# Patient Record
Sex: Female | Born: 1955 | Race: White | Hispanic: No | Marital: Married | Smoking: Former smoker
Health system: Southern US, Community
[De-identification: ages and names within clinical notes are randomized; demographics above are authoritative.]

## PROBLEM LIST (undated history)

## (undated) DIAGNOSIS — M1990 Unspecified osteoarthritis, unspecified site: Secondary | ICD-10-CM

## (undated) DIAGNOSIS — F32A Depression, unspecified: Secondary | ICD-10-CM

## (undated) DIAGNOSIS — G8929 Other chronic pain: Secondary | ICD-10-CM

## (undated) DIAGNOSIS — F419 Anxiety disorder, unspecified: Secondary | ICD-10-CM

## (undated) DIAGNOSIS — R251 Tremor, unspecified: Secondary | ICD-10-CM

## (undated) DIAGNOSIS — F329 Major depressive disorder, single episode, unspecified: Secondary | ICD-10-CM

## (undated) DIAGNOSIS — G2581 Restless legs syndrome: Secondary | ICD-10-CM

## (undated) DIAGNOSIS — E559 Vitamin D deficiency, unspecified: Secondary | ICD-10-CM

## (undated) HISTORY — DX: Depression, unspecified: F32.A

## (undated) HISTORY — DX: Anxiety disorder, unspecified: F41.9

## (undated) HISTORY — DX: Vitamin D deficiency, unspecified: E55.9

## (undated) HISTORY — PX: OTHER SURGICAL HISTORY: SHX169

## (undated) HISTORY — DX: Tremor, unspecified: R25.1

## (undated) HISTORY — DX: Other chronic pain: G89.29

## (undated) HISTORY — DX: Unspecified osteoarthritis, unspecified site: M19.90

## (undated) HISTORY — DX: Restless legs syndrome: G25.81

---
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
## 2007-09-23 ENCOUNTER — Emergency Department: Payer: Self-pay | Admitting: Emergency Medicine

## 2012-02-25 ENCOUNTER — Emergency Department: Payer: Self-pay | Admitting: Emergency Medicine

## 2012-03-21 ENCOUNTER — Ambulatory Visit: Payer: Self-pay | Admitting: Family Medicine

## 2013-08-23 ENCOUNTER — Emergency Department: Payer: Self-pay | Admitting: Internal Medicine

## 2013-08-23 LAB — COMPREHENSIVE METABOLIC PANEL
AST: 9 U/L — AB (ref 15–37)
Albumin: 3.9 g/dL (ref 3.4–5.0)
Alkaline Phosphatase: 74 U/L
Anion Gap: 7 (ref 7–16)
BUN: 10 mg/dL (ref 7–18)
Bilirubin,Total: 0.5 mg/dL (ref 0.2–1.0)
CALCIUM: 9.6 mg/dL (ref 8.5–10.1)
CREATININE: 0.79 mg/dL (ref 0.60–1.30)
Chloride: 101 mmol/L (ref 98–107)
Co2: 31 mmol/L (ref 21–32)
EGFR (African American): >60
EGFR (Non-African Amer.): >60
Glucose: 116 mg/dL — ABNORMAL HIGH (ref 65–99)
Osmolality: 278 (ref 275–301)
POTASSIUM: 3.8 mmol/L (ref 3.5–5.1)
SGPT (ALT): 16 U/L (ref 12–78)
SODIUM: 139 mmol/L (ref 136–145)
Total Protein: 8.3 g/dL — ABNORMAL HIGH (ref 6.4–8.2)

## 2013-08-23 LAB — URINALYSIS, COMPLETE
BILIRUBIN, UR: NEGATIVE
Bacteria: NONE SEEN
GLUCOSE, UR: NEGATIVE mg/dL (ref 0–75)
Ketone: NEGATIVE
Leukocyte Esterase: NEGATIVE
Nitrite: NEGATIVE
PH: 7 (ref 4.5–8.0)
PROTEIN: NEGATIVE
RBC,UR: 4 /HPF (ref 0–5)
Specific Gravity: 1.013 (ref 1.003–1.030)
Squamous Epithelial: 1

## 2013-08-23 LAB — CBC WITH DIFFERENTIAL/PLATELET
BASOS ABS: 0.1 10*3/uL (ref 0.0–0.1)
Basophil %: 1.1 %
EOS ABS: 0.1 10*3/uL (ref 0.0–0.7)
Eosinophil %: 0.6 %
HCT: 42.7 % (ref 35.0–47.0)
HGB: 13.8 g/dL (ref 12.0–16.0)
LYMPHS ABS: 3.6 10*3/uL (ref 1.0–3.6)
Lymphocyte %: 25.4 %
MCH: 29.8 pg (ref 26.0–34.0)
MCHC: 32.3 g/dL (ref 32.0–36.0)
MCV: 92 fL (ref 80–100)
MONO ABS: 0.8 x10 3/mm (ref 0.2–0.9)
Monocyte %: 5.7 %
NEUTROS ABS: 9.4 10*3/uL — AB (ref 1.4–6.5)
Neutrophil %: 67.2 %
PLATELETS: 296 10*3/uL (ref 150–440)
RBC: 4.64 10*6/uL (ref 3.80–5.20)
RDW: 13.7 % (ref 11.5–14.5)
WBC: 14 10*3/uL — ABNORMAL HIGH (ref 3.6–11.0)

## 2013-09-15 ENCOUNTER — Emergency Department: Payer: Self-pay | Admitting: Emergency Medicine

## 2013-09-15 LAB — CBC WITH DIFFERENTIAL/PLATELET
Basophil #: 0.1 10*3/uL (ref 0.0–0.1)
Basophil %: 0.6 %
Eosinophil #: 0.1 10*3/uL (ref 0.0–0.7)
Eosinophil %: 0.3 %
HCT: 40.8 % (ref 35.0–47.0)
HGB: 13.6 g/dL (ref 12.0–16.0)
Lymphocyte #: 2 10*3/uL (ref 1.0–3.6)
Lymphocyte %: 10.3 %
MCH: 30.5 pg (ref 26.0–34.0)
MCHC: 33.4 g/dL (ref 32.0–36.0)
MCV: 91 fL (ref 80–100)
Monocyte #: 1.3 x10 3/mm — ABNORMAL HIGH (ref 0.2–0.9)
Monocyte %: 6.8 %
NEUTROS ABS: 15.8 10*3/uL — AB (ref 1.4–6.5)
NEUTROS PCT: 82 %
Platelet: 324 10*3/uL (ref 150–440)
RBC: 4.47 10*6/uL (ref 3.80–5.20)
RDW: 13.3 % (ref 11.5–14.5)
WBC: 19.3 10*3/uL — AB (ref 3.6–11.0)

## 2013-09-15 LAB — URINALYSIS, COMPLETE
BILIRUBIN, UR: NEGATIVE
GLUCOSE, UR: NEGATIVE mg/dL (ref 0–75)
Leukocyte Esterase: NEGATIVE
Nitrite: NEGATIVE
PH: 5 (ref 4.5–8.0)
Protein: 30
RBC,UR: 19 /HPF (ref 0–5)
SPECIFIC GRAVITY: 1.024 (ref 1.003–1.030)
Squamous Epithelial: NONE SEEN

## 2013-09-15 LAB — COMPREHENSIVE METABOLIC PANEL
ALBUMIN: 3.8 g/dL (ref 3.4–5.0)
AST: 21 U/L (ref 15–37)
Alkaline Phosphatase: 58 U/L
Anion Gap: 5 — ABNORMAL LOW (ref 7–16)
BUN: 8 mg/dL (ref 7–18)
Bilirubin,Total: 0.5 mg/dL (ref 0.2–1.0)
CO2: 31 mmol/L (ref 21–32)
Calcium, Total: 9.1 mg/dL (ref 8.5–10.1)
Chloride: 103 mmol/L (ref 98–107)
Creatinine: 0.89 mg/dL (ref 0.60–1.30)
EGFR (African American): >60
EGFR (Non-African Amer.): >60
Glucose: 103 mg/dL — ABNORMAL HIGH (ref 65–99)
OSMOLALITY: 276 (ref 275–301)
Potassium: 3.3 mmol/L — ABNORMAL LOW (ref 3.5–5.1)
SGPT (ALT): 15 U/L (ref 12–78)
Sodium: 139 mmol/L (ref 136–145)
TOTAL PROTEIN: 7.5 g/dL (ref 6.4–8.2)

## 2013-09-15 LAB — CLOSTRIDIUM DIFFICILE(ARMC)

## 2016-01-20 ENCOUNTER — Ambulatory Visit
Admission: RE | Admit: 2016-01-20 | Discharge: 2016-01-20 | Disposition: A | Discharge status: Home / Self Care | Payer: Self-pay | Source: Ambulatory Visit | Attending: Oncology | Admitting: Oncology

## 2016-01-20 ENCOUNTER — Encounter (INDEPENDENT_AMBULATORY_CARE_PROVIDER_SITE_OTHER): Payer: Self-pay

## 2016-01-20 ENCOUNTER — Ambulatory Visit: Payer: Self-pay | Attending: Oncology | Admitting: *Deleted

## 2016-01-20 VITALS — BP 126/72 | HR 62 | Temp 98.0°F | Ht 66.14 in | Wt 145.9 lb

## 2016-01-20 DIAGNOSIS — N644 Mastodynia: Secondary | ICD-10-CM

## 2016-01-20 NOTE — Progress Notes (Signed)
Subjective:     Patient ID: Michelle Walsh, female   DOB: November 15, 1955, 60 y.o.   MRN: [id number]  HPI   Review of Systems     Objective:   Physical Exam  Pulmonary/Chest: Right breast exhibits no inverted nipple, no mass, no nipple discharge, no skin change and no tenderness. Left breast exhibits no inverted nipple, no mass, no nipple discharge, no skin change and no tenderness. Breasts are asymmetrical.    Patient complains of an intermittent "electric shock like" burning pain in the left nipple for about 2 months   Left breast is larger than the right breast       Assessment:     60 year old White female presents to [facility] with complaints of a 2 month history of intermittent burning breast pain.  States it only last a couple of seconds, feels like "an electric shock", and burns around her left nipple.  Visually the patients head is bobbing with tremor like motion.  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  States it gets worse when she is under stress.  States she also has arthritis and back issues.  States she notices the breast pain mainly when she is holding her 47 month old grand child.  States she is under tremendous stress.  Her daughter and family of 21, is currently living with her, and she is helping with the children who are all under the age of 23. Her husband also has MS.  The patient also states she drinks a lot of coffee.  States I always have a cup in my hand.  On clinical breast exam there is no dominant mass, skin changes, nipple discharge, or lymphadenopathy.  There is a slight asymmetry in the bilateral thickening in the upper outer quadrants, with greater thickening in the left breast.  Taught self breast awareness.  Patient has been screened for eligibility.  She does not have any insurance, Medicare or Medicaid.  She also meets financial eligibility.  Hand-out given on the Affordable Care Act.    Plan:     Will get bilateral  diagnostic mammogram with ultrasound.  If no findings on imaging we will re-assess the patient in about 2 months, unless she has experience increased pain.  She is to cut back on caffeine products also.  I have discussed [facility] with the patient and she is interested in some sessions.  Left message with Ebony Hail in [place] to schedule the patient for up to 6 free sessions.  We will follow-up per [facility] protocol.

## 2016-01-20 NOTE — Patient Instructions (Signed)
Gave patient hand-out, Women Staying Healthy, Active and Well from [facility], with education on breast health, pap smears, heart and colon health. 

## 2016-02-27 ENCOUNTER — Encounter: Payer: Self-pay | Admitting: *Deleted

## 2016-02-27 NOTE — Progress Notes (Signed)
Letter mailed to inform patient of her follow up appointment on 03/23/16 @ 10:30 to reassess her breast pain.  Letter mailed from the [facility] to inform patient of her normal mammogram results.  Patient is to follow-up with annual screening in one year.  HSIS to [place].

## 2016-03-23 ENCOUNTER — Ambulatory Visit: Payer: Self-pay

## 2016-03-30 ENCOUNTER — Ambulatory Visit: Payer: Self-pay | Attending: Oncology

## 2018-08-17 ENCOUNTER — Ambulatory Visit: Payer: Self-pay

## 2018-11-17 DIAGNOSIS — M25561 Pain in right knee: Secondary | ICD-10-CM | POA: Diagnosis not present

## 2018-11-17 DIAGNOSIS — M67432 Ganglion, left wrist: Secondary | ICD-10-CM | POA: Diagnosis not present

## 2018-11-17 DIAGNOSIS — Z0000 Encounter for general adult medical examination without abnormal findings: Secondary | ICD-10-CM | POA: Diagnosis not present

## 2018-11-17 DIAGNOSIS — M25521 Pain in right elbow: Secondary | ICD-10-CM | POA: Diagnosis not present

## 2018-11-17 DIAGNOSIS — M654 Radial styloid tenosynovitis [de Quervain]: Secondary | ICD-10-CM | POA: Diagnosis not present

## 2018-11-17 DIAGNOSIS — M1990 Unspecified osteoarthritis, unspecified site: Secondary | ICD-10-CM | POA: Diagnosis not present

## 2018-12-08 ENCOUNTER — Other Ambulatory Visit: Payer: Self-pay

## 2018-12-19 ENCOUNTER — Encounter: Payer: Self-pay | Admitting: *Deleted

## 2018-12-21 ENCOUNTER — Ambulatory Visit (INDEPENDENT_AMBULATORY_CARE_PROVIDER_SITE_OTHER): Payer: PPO | Admitting: Family Medicine

## 2018-12-21 ENCOUNTER — Other Ambulatory Visit: Payer: Self-pay

## 2018-12-21 ENCOUNTER — Encounter: Payer: Self-pay | Admitting: Family Medicine

## 2018-12-21 VITALS — BP 110/70 | HR 54 | Temp 98.3°F | Ht 64.0 in | Wt 137.6 lb

## 2018-12-21 DIAGNOSIS — G3281 Cerebellar ataxia in diseases classified elsewhere: Secondary | ICD-10-CM

## 2018-12-21 DIAGNOSIS — G8929 Other chronic pain: Secondary | ICD-10-CM | POA: Diagnosis not present

## 2018-12-21 DIAGNOSIS — F418 Other specified anxiety disorders: Secondary | ICD-10-CM | POA: Diagnosis not present

## 2018-12-21 DIAGNOSIS — M159 Polyosteoarthritis, unspecified: Secondary | ICD-10-CM | POA: Insufficient documentation

## 2018-12-21 DIAGNOSIS — E559 Vitamin D deficiency, unspecified: Secondary | ICD-10-CM

## 2018-12-21 DIAGNOSIS — M25561 Pain in right knee: Secondary | ICD-10-CM | POA: Diagnosis not present

## 2018-12-21 DIAGNOSIS — G243 Spasmodic torticollis: Secondary | ICD-10-CM | POA: Insufficient documentation

## 2018-12-21 DIAGNOSIS — R944 Abnormal results of kidney function studies: Secondary | ICD-10-CM

## 2018-12-21 DIAGNOSIS — Z13220 Encounter for screening for lipoid disorders: Secondary | ICD-10-CM | POA: Diagnosis not present

## 2018-12-21 DIAGNOSIS — R251 Tremor, unspecified: Secondary | ICD-10-CM

## 2018-12-21 DIAGNOSIS — E785 Hyperlipidemia, unspecified: Secondary | ICD-10-CM | POA: Diagnosis not present

## 2018-12-21 LAB — COMPREHENSIVE METABOLIC PANEL
ALT: 10 U/L (ref 0–35)
AST: 11 U/L (ref 0–37)
Albumin: 4.3 g/dL (ref 3.5–5.2)
Alkaline Phosphatase: 54 U/L (ref 39–117)
BUN: 16 mg/dL (ref 6–23)
CO2: 30 mEq/L (ref 19–32)
Calcium: 9.2 mg/dL (ref 8.4–10.5)
Chloride: 101 mEq/L (ref 96–112)
Creatinine, Ser: 1.11 mg/dL (ref 0.40–1.20)
GFR: 49.3 mL/min — ABNORMAL LOW (ref >60.00)
Glucose, Bld: 100 mg/dL — ABNORMAL HIGH (ref 70–99)
Potassium: 4.1 mEq/L (ref 3.5–5.1)
Sodium: 140 mEq/L (ref 135–145)
Total Bilirubin: 0.5 mg/dL (ref 0.2–1.2)
Total Protein: 6.6 g/dL (ref 6.0–8.3)

## 2018-12-21 LAB — CBC WITH DIFFERENTIAL/PLATELET
Basophils Absolute: 0.1 10*3/uL (ref 0.0–0.1)
Basophils Relative: 0.6 % (ref 0.0–3.0)
Eosinophils Absolute: 0.2 10*3/uL (ref 0.0–0.7)
Eosinophils Relative: 1.8 % (ref 0.0–5.0)
HCT: 37 % (ref 36.0–46.0)
Hemoglobin: 12.1 g/dL (ref 12.0–15.0)
Lymphocytes Relative: 22.7 % (ref 12.0–46.0)
Lymphs Abs: 2.3 10*3/uL (ref 0.7–4.0)
MCHC: 32.8 g/dL (ref 30.0–36.0)
MCV: 92 fl (ref 78.0–100.0)
Monocytes Absolute: 0.6 10*3/uL (ref 0.1–1.0)
Monocytes Relative: 6.2 % (ref 3.0–12.0)
Neutro Abs: 6.9 10*3/uL (ref 1.4–7.7)
Neutrophils Relative %: 68.7 % (ref 43.0–77.0)
Platelets: 302 10*3/uL (ref 150.0–400.0)
RBC: 4.03 Mil/uL (ref 3.87–5.11)
RDW: 13.6 % (ref 11.5–15.5)
WBC: 10.1 10*3/uL (ref 4.0–10.5)

## 2018-12-21 LAB — LIPID PANEL
Cholesterol: 236 mg/dL — ABNORMAL HIGH (ref 0–200)
HDL: 38 mg/dL — ABNORMAL LOW (ref >39.00)
LDL Cholesterol: 170 mg/dL — ABNORMAL HIGH (ref 0–99)
NonHDL: 198.31
Total CHOL/HDL Ratio: 6
Triglycerides: 142 mg/dL (ref 0.0–149.0)
VLDL: 28.4 mg/dL (ref 0.0–40.0)

## 2018-12-21 LAB — VITAMIN B12: Vitamin B-12: 228 pg/mL (ref 211–911)

## 2018-12-21 LAB — TSH: TSH: 4.14 u[IU]/mL (ref 0.35–4.50)

## 2018-12-21 LAB — VITAMIN D 25 HYDROXY (VIT D DEFICIENCY, FRACTURES): VITD: 80.78 ng/mL (ref 30.00–100.00)

## 2018-12-21 MED ORDER — MELOXICAM 7.5 MG PO TABS: 7.5000 mg | ORAL_TABLET | Freq: Every day | ORAL | 2 refills | Status: DC | PRN

## 2018-12-21 MED ORDER — CITALOPRAM HYDROBROMIDE 20 MG PO TABS: 20.0000 mg | ORAL_TABLET | Freq: Every day | ORAL | 1 refills | Status: DC

## 2018-12-21 NOTE — Progress Notes (Signed)
Subjective:    Patient ID: Michelle Walsh, female    DOB: January 13, 1956, 63 y.o.   MRN: [id number]  HPI  Patient presents to clinic to establish with PCP.  Patient has a past medical history of a tremor, restless leg syndrome, vitamin D deficiency, osteoarthritis of multiple joints mainly right knee at this time, depression with anxiety.  Social, surgical and family history also very reviewed and updated accordingly in chart.  She declines flu vaccine today, believes she has had 1 of the pneumonia vaccines we will wait on records from her previous PCP to determine which of the 2 pneumonia vaccine she requires.  Currently takes citalopram 20 mg once daily for her depression and anxiety.  Patient believes a lot of her depression anxiety is situational.  Her daughter and 3 young grandchildren are now living with her and her husband.  States she loves her daughter and grandchildren, but has been adjustment getting used to her quiet house now being full of young children again.  Likes role of grandparent, but now finds herself having to step into a parent role often.  Patient also does use marijuana as a form of depression and anxiety treatment.  Finds it very helpful to calm her down.  Patient has a history of tremor in bilateral upper extremities and also head shaking.  Currently is on propranolol 60 mg twice daily, this is helped a lot to reduce head shaking, but still has tremor in both arms and especially notices this when trying to hold something in either her right or left hand.  Patient has never had MRI of brain or see neurology for work-up of her tremor. Also states she does shuffle at time with walking.  Has been prescribed diclofenac topical gel to use as needed for joint pain, does not find this effective.  Most recently her right knee has been aching especially.  Feels very stiff in the morning, stiffness seems to improve as day goes on as she gets moving.  Requip works well at 1 mg dose  before bed to control restless leg.  Has history of vitamin D deficiency, currently maintains vitamin D levels by taking 5000 units vitamin D once per day.  Patient Active Problem List   Diagnosis Date Noted  . Tremor 12/21/2018  . Vitamin D deficiency 12/21/2018  . Cerebellar ataxia in diseases classified elsewhere ([place]) 12/21/2018  . Osteoarthritis of multiple joints 12/21/2018  . Chronic pain of right knee 12/21/2018  . Depression with anxiety 12/21/2018     Past Surgical History:  Procedure Laterality Date  . CESAREAN SECTION      Family History  Problem Relation Age of Onset  . Breast cancer Maternal Grandmother 28   Social History   Tobacco Use  . Smoking status: Not on file  Substance Use Topics  . Alcohol use: Not on file    Review of Systems  Constitutional: Negative for chills, fatigue and fever.  HENT: Negative for congestion, ear pain, sinus pain and sore throat.   Eyes: Negative.   Respiratory: Negative for cough, shortness of breath and wheezing.   Cardiovascular: Negative for chest pain, palpitations and leg swelling.  Gastrointestinal: Negative for abdominal pain, diarrhea, nausea and vomiting.  Genitourinary: Negative for dysuria, frequency and urgency.  Musculoskeletal: +right knee pain Skin: Negative for color change, pallor and rash.  Neurological: Negative for syncope, light-headedness and headaches. +tremor Psychiatric/Behavioral: The patient is not nervous/anxious.       Objective:   Physical Exam  Vitals signs and nursing note reviewed.  Constitutional:      General: She is not in acute distress.    Appearance: She is normal weight. She is not ill-appearing, toxic-appearing or diaphoretic.  HENT:     Head: Normocephalic and atraumatic.     Right Ear: Tympanic membrane, ear canal and external ear normal.     Left Ear: Tympanic membrane, ear canal and external ear normal.  Eyes:     General: No scleral icterus.    Extraocular Movements:  Extraocular movements intact.     Conjunctiva/sclera: Conjunctivae normal.     Pupils: Pupils are equal, round, and reactive to light.  Neck:     Musculoskeletal: Normal range of motion and neck supple. No neck rigidity.     Vascular: No carotid bruit.  Cardiovascular:     Rate and Rhythm: Normal rate and regular rhythm.     Heart sounds: Normal heart sounds.  Pulmonary:     Effort: Pulmonary effort is normal.     Breath sounds: Normal breath sounds.  Abdominal:     General: Bowel sounds are normal. There is no distension.     Palpations: Abdomen is soft. There is no mass.     Tenderness: There is no abdominal tenderness. There is no guarding or rebound.     Hernia: No hernia is present.  Musculoskeletal:     Right lower leg: No edema.     Left lower leg: No edema.  Skin:    General: Skin is warm and dry.     Capillary Refill: Capillary refill takes less than 2 seconds.     Coloration: Skin is not jaundiced or pale.  Neurological:     Mental Status: She is alert and oriented to person, place, and time.     Comments: Grips and dorsiplantar flexion equal and strong. Speech clear, smile symmetrical When holds arms straight out in front of her, can see visible tremor bilaterally.   Psychiatric:        Mood and Affect: Mood normal.        Behavior: Behavior normal.        Thought Content: Thought content normal.        Judgment: Judgment normal.    Today's Vitals   12/21/18 0905  BP: 110/70  Pulse: (!) 54  Temp: 98.3 F (36.8 C)  TempSrc: Oral  SpO2: 98%  Weight: 137 lb 9.6 oz (62.4 kg)  Height: 5\' 4"  (1.626 m)   Body mass index is 23.62 kg/m.     Assessment & Plan:      A total of 45  minutes were spent face-to-face with the patient during this encounter and over half of that time was spent on counseling and coordination of care. The patient was counseled on work up with labs, MRI, referral to neurology, xray of knee.     Tremor-patient will continue propranolol  60 mg twice daily as this does seem to help reduce her tremor especially the head shaking.  We will get MRI of brain to further investigate tremor and also I will put in neurology referral for full evaluation.  Patient does report struggling gait at times, so I want to be sure we investigate possibility of any Parkinson's disease.  Osteoarthritis multiple joint/right knee pain-patient will use Mobic 7.5 mg as needed for days when she has have more aching in the joints.  Also advised she can continue to use topical diclofenac gel if needed on sore joints.  We  will get x-ray of right knee to investigate the right knee pain that is more obvious/annoying to patient at this time.  Recommended exercising daily including walking, stretching and range of motion to keep all joints from becoming too stiff and keeping body active.  Vitamin D deficiency-we will check vitamin D levels and lab work as well as vitamin B12.  Depression with anxiety-she will continue citalopram 20 mg once per day.  Advised if smoking marijuana to be sure not to drive or operate any heavy machinery after smoking as this is medication can cause impairment and if she were to be pulled over while driving, it would constitute a DUI.  Patient verbalizes understanding of this risk with smoking marijuana.  We will get lab work in clinic today including CBC, CMP, vitamin D, B12, TSH and lipid panel.  Patient aware that so we will contact her regards to neurology referral and MRI  We will plan to have her follow-up in office in about 3 months and we can do complete physical exam at that time to get all of her screenings up-to-date for her age.

## 2018-12-26 MED ORDER — ROSUVASTATIN CALCIUM 10 MG PO TABS: 10.0000 mg | ORAL_TABLET | Freq: Every day | ORAL | 3 refills | Status: DC

## 2018-12-26 NOTE — Addendum Note (Signed)
Addended by: Philis Nettle on: 12/26/2018 04:44 PM   Modules accepted: Orders

## 2018-12-26 NOTE — Addendum Note (Signed)
Addended by: Philis Nettle on: 12/26/2018 01:01 PM   Modules accepted: Orders

## 2019-01-06 ENCOUNTER — Telehealth: Payer: Self-pay | Admitting: *Deleted

## 2019-01-06 DIAGNOSIS — R944 Abnormal results of kidney function studies: Secondary | ICD-10-CM

## 2019-01-06 NOTE — Telephone Encounter (Signed)
Please place future orders for lab appt.  

## 2019-01-09 ENCOUNTER — Other Ambulatory Visit: Payer: Self-pay

## 2019-01-09 ENCOUNTER — Other Ambulatory Visit (INDEPENDENT_AMBULATORY_CARE_PROVIDER_SITE_OTHER): Payer: PPO

## 2019-01-09 DIAGNOSIS — R944 Abnormal results of kidney function studies: Secondary | ICD-10-CM

## 2019-01-09 LAB — BASIC METABOLIC PANEL
BUN: 19 mg/dL (ref 6–23)
CO2: 30 mEq/L (ref 19–32)
Calcium: 9.5 mg/dL (ref 8.4–10.5)
Chloride: 102 mEq/L (ref 96–112)
Creatinine, Ser: 1.06 mg/dL (ref 0.40–1.20)
GFR: 51.98 mL/min — ABNORMAL LOW (ref >60.00)
Glucose, Bld: 105 mg/dL — ABNORMAL HIGH (ref 70–99)
Potassium: 3.6 mEq/L (ref 3.5–5.1)
Sodium: 139 mEq/L (ref 135–145)

## 2019-01-09 NOTE — Telephone Encounter (Signed)
BMET in

## 2019-01-10 ENCOUNTER — Encounter: Payer: Self-pay | Admitting: Neurology

## 2019-01-21 ENCOUNTER — Ambulatory Visit
Admission: RE | Admit: 2019-01-21 | Discharge: 2019-01-21 | Disposition: A | Discharge status: Home / Self Care | Payer: PPO | Source: Ambulatory Visit | Attending: Family Medicine | Admitting: Family Medicine

## 2019-01-21 ENCOUNTER — Other Ambulatory Visit: Payer: Self-pay

## 2019-01-21 DIAGNOSIS — R251 Tremor, unspecified: Secondary | ICD-10-CM | POA: Diagnosis not present

## 2019-01-21 DIAGNOSIS — G3281 Cerebellar ataxia in diseases classified elsewhere: Secondary | ICD-10-CM

## 2019-01-21 DIAGNOSIS — R270 Ataxia, unspecified: Secondary | ICD-10-CM | POA: Diagnosis not present

## 2019-02-13 NOTE — Progress Notes (Signed)
Michelle Walsh was seen today in the movement disorders clinic for neurologic consultation at the request of Guse, Jacquelynn Cree, FNP.  The consultation is for the evaluation of tremor.  Tremor: Yes.     How long has it been going on? "all my life" - had it even into elementary school  At rest or with activation?  activation  Fam hx of tremor?  Yes.  , mother and son  Located where?  Hands and head - started in hands, L more than R (she is R handed).  Cannot state when head started  Affected by caffeine:  No. (drinks 4 cans pepsi per day)  Affected by alcohol: doesn't drink enough to know if changes tremor  Affected by stress:  Yes.  , and states that her daughter and 4 children moved in with her 4 years ago and that increased stress  Affected by fatigue:  No.  Spills soup if on spoon:  No.  Spills glass of liquid if full:  No.  Affects ADL's (tying shoes, brushing teeth, etc):  No.  Tremor inducing meds:  No.   Tremor improving meds:  Propranolol 60 mg bid (started on propranolol 2-3 years ago and it really helped when started but seemed to lose efficacy with time)    I personally reviewed her MRI of the brain from January 21, 2019.  There were a few scattered punctate T2 hyperintensities.  It was otherwise unremarkable.  PREVIOUS MEDICATIONS: propranolol 60 mg bid; requip 1 mg q hs (on this for RLS) - I get "restless legs" in my whole body - arms/chest/legs  ALLERGIES:   Allergies  Allergen Reactions  . Codeine Nausea And Vomiting    CURRENT MEDICATIONS:  Current Outpatient Medications  Medication Instructions  . citalopram (CELEXA) 20 mg, Oral, Daily  . diclofenac sodium (VOLTAREN) 1 % GEL No dose, route, or frequency recorded.  . meloxicam (MOBIC) 7.5 mg, Oral, Daily PRN  . propranolol (INDERAL) 60 mg, 2 times daily  . rOPINIRole (REQUIP) 1 mg, Oral, Daily at bedtime  . rosuvastatin (CRESTOR) 10 mg, Oral, Daily  . Vitamin D3 125 mcg, Oral, Daily    PAST MEDICAL HISTORY:    Past Medical History:  Diagnosis Date  . Anxiety   . Chronic pain of right knee   . Depression   . Osteoarthritis   . Restless leg syndrome   . Tremors of nervous system   . Vitamin D deficiency     PAST SURGICAL HISTORY:   Past Surgical History:  Procedure Laterality Date  . CESAREAN SECTION    . teeth all removed      SOCIAL HISTORY:   Social History   Socioeconomic History  . Marital status: Married    Spouse name: Not on file  . Number of children: 2  . Years of education: 37  . Highest education level: Not on file  Occupational History  . Occupation: retired  Scientific laboratory technician  . Financial resource strain: Not on file  . Food insecurity    Worry: Not on file    Inability: Not on file  . Transportation needs    Medical: Not on file    Non-medical: Not on file  Tobacco Use  . Smoking status: Never Smoker  . Smokeless tobacco: Never Used  Substance and Sexual Activity  . Alcohol use: Never    Frequency: Never    Comment: very rare - 1 time per year  . Drug use: Yes    Types:  Marijuana    Comment: does it thru a vape - uses THC - "helps with anxiety"  . Sexual activity: Not on file  Lifestyle  . Physical activity    Days per week: Not on file    Minutes per session: Not on file  . Stress: Not on file  Relationships  . Social Herbalist on phone: Not on file    Gets together: Not on file    Attends religious service: Not on file    Active member of club or organization: Not on file    Attends meetings of clubs or organizations: Not on file    Relationship status: Not on file  . Intimate partner violence    Fear of current or ex partner: Not on file    Emotionally abused: Not on file    Physically abused: Not on file    Forced sexual activity: Not on file  Other Topics Concern  . Not on file  Social History Narrative   2 children   Right handed   One story home    FAMILY HISTORY:   Family Status  Relation Name Status  . MGM  (Not  Specified)  . Mother  Alive  . Father  Deceased  . Sister  Alive  . Brother  Alive  . Sister  Alive  . Daughter adopted Alive  . Son  Alive    ROS:  Review of Systems  Constitutional: Negative.   HENT: Negative.   Eyes: Negative.   Respiratory: Negative.   Cardiovascular: Negative.   Gastrointestinal: Negative.   Genitourinary: Negative.   Musculoskeletal: Negative.   Skin: Negative.   Neurological: Positive for tremors.  Endo/Heme/Allergies: Negative.   Psychiatric/Behavioral: Positive for depression. The patient is nervous/anxious.     PHYSICAL EXAMINATION:    VITALS:   Vitals:   02/17/19 0946  BP: 122/68  Pulse: 74  SpO2: 98%  Weight: 136 lb (61.7 kg)  Height: 5\' 5"  (1.651 m)    GEN:  The patient appears stated age and is in NAD. HEENT:  Normocephalic, atraumatic.  The mucous membranes are moist. The superficial temporal arteries are without ropiness or tenderness. CV:  RRR Lungs:  CTAB Neck/HEME:  There are no carotid bruits bilaterally. MS:  There is hypertrophy of R SCM.  Head is turned L with irregular tremor.  Mild L ear to L shoulder  Neurological examination:  Orientation: The patient is alert and oriented x3. Fund of knowledge is appropriate.  Recent and remote memory are intact.  Attention and concentration are normal.    Able to name objects and repeat phrases. Cranial nerves: There is good facial symmetry.  Extraocular muscles are intact. The visual fields are full to confrontational testing. The speech is fluent and clear. Soft palate rises symmetrically and there is no tongue deviation. Hearing is intact to conversational tone. Sensation: Sensation is intact to light and pinprick throughout (facial, trunk, extremities). Vibration is intact at the bilateral big toe. There is no extinction with double simultaneous stimulation. There is no sensory dermatomal level identified. Motor: Strength is 5/5 in the bilateral upper and lower extremities.   Shoulder  shrug is equal and symmetric.  There is no pronator drift. Deep tendon reflexes: Deep tendon reflexes are 2+/4 at the bilateral biceps, triceps, brachioradialis, patella and achilles. Plantar responses are downgoing bilaterally.  Movement examination: Tone: There is tone in the bilateral upper extremities.  The tone in the lower extremities is normal.  Abnormal movements:  there Is no rest tremor.  There is significant postural tremor on the L, none on the R until given a weight and then it is mild to mod.  She has significant trouble with Archimedes spirals, particularly on the left.  She has trouble pouring water from 1 glass to another in both hands. Coordination:  There is no decremation with RAM's, with any form of RAMS, including alternating supination and pronation of the forearm, hand opening and closing, finger taps, heel taps and toe taps. Gait and Station: The patient has no difficulty arising out of a deep-seated chair without the use of the hands. The patient's stride length is normal.    ASSESSMENT/PLAN:  1.  Cervical Dystonia  -I talked to the patient about the nature and pathophysiology.  The patient is having trouble with ADL's and with rotation of the head in daily life for driving.  The primary muscles involved are the right sternocleidomastoid and left splenius capitis.  The left levator scapula is also involved..  We talked about treatments.  We talked about the value of botox.  The patient was educated on the botulinum toxin the black blox warning and given a copy of the botox patient medication guide.  The patient understands that this warning states that there have been reported cases of the Botox extending beyond the injection site and creating adverse effects, similar to those of botulism. This included loss of strength, trouble walking, hoarseness, trouble saying words clearly, loss of bladder control, trouble breathing, trouble swallowing, diplopia, blurry vision and ptosis.  Most of the distant spread of Botox was happening in patients, primarily children, who received medication for spasticity or for cervical dystonia. The patient expressed understanding and desire to proceed.  I will try to get authorization.  2.    Essential Tremor.  -This is evidenced by the symmetrical nature and longstanding hx of gradually getting worse.  We discussed nature and pathophysiology.  We discussed that this can continue to gradually get worse with time.  We discussed that some medications can worsen this, as can caffeine use.  We discussed medication therapy as well as surgical therapy.  Ultimately, the patient decided to start primidone 50 mg daily for a week and then 50 mg bid.  This likely won't be enough  -continue propranolol 60 mg bid.    -I talked to the patient about the logistics associated with DBS therapy.  I talked to the patient about risks/benefits/side effects of DBS therapy.  We talked about risks which included but were not limited to infection, paralysis, intraoperative seizure, death, stroke, bleeding around the electrode.   I talked to patient about fiducial placement 1 week prior to DBS therapy.  I talked to the patient about what to expect in the operating room, including the fact that this is an awake surgery.  We talked about battery placement as well as which is done under general anesthesia, generally approximately one week following the initial surgery.  We also talked about the fact that the patient will need to be off of medications for surgery.  The patient and family were given the opportunity to ask questions, which they did, and I answered them to the best of my ability today.  She was shown HIPAA compliant videos of patients who have previously had surgery.  She is going to think about this as an option.  3.  Follow up is anticipated when we get approval for her botox.  Much greater than 50% of this visit  was spent in counseling and coordinating care.  Total  face to face time:  60 min         Cc:  Guse, Jacquelynn Cree, FNP

## 2019-02-17 ENCOUNTER — Other Ambulatory Visit: Payer: Self-pay

## 2019-02-17 ENCOUNTER — Encounter: Payer: Self-pay | Admitting: Neurology

## 2019-02-17 ENCOUNTER — Ambulatory Visit: Payer: PPO | Admitting: Neurology

## 2019-02-17 VITALS — BP 122/68 | HR 74 | Ht 65.0 in | Wt 136.0 lb

## 2019-02-17 DIAGNOSIS — G250 Essential tremor: Secondary | ICD-10-CM

## 2019-02-17 DIAGNOSIS — G243 Spasmodic torticollis: Secondary | ICD-10-CM | POA: Diagnosis not present

## 2019-02-17 MED ORDER — PRIMIDONE 50 MG PO TABS: 50.0000 mg | ORAL_TABLET | Freq: Two times a day (BID) | ORAL | 1 refills | Status: DC

## 2019-02-17 NOTE — Patient Instructions (Signed)
Start primidone - 50 mg - 1/2 tablet at bedtime for 1 week and then 1 tablet twice per day thereafter so long as you are tolerating the medication Continue propranolol 60 mg twice per day

## 2019-02-20 ENCOUNTER — Encounter: Payer: Self-pay | Admitting: *Deleted

## 2019-02-20 NOTE — Progress Notes (Signed)
Michelle Walsh Key: [id number] - PA Case ID: [id number] Need help? Call us at [phone] Status Sent to [facility] Botox 100UNIT solution Form [employer] Medicare 4-Part NCPDP Electronic PA Form  [employer] has received your information, and the request will be reviewed. You may close this dialog, return to your dashboard, and perform other tasks.  You will receive an electronic determination in CoverMyMeds. You can see the latest determination by locating this request on your dashboard or by reopening this request. You will also receive a faxed copy of the determination. If you have any questions please contact [employer] at [phone].  If you need assistance, please chat with CoverMyMeds or call us at [phone].

## 2019-02-23 ENCOUNTER — Telehealth: Payer: Self-pay | Admitting: Neurology

## 2019-02-23 NOTE — Telephone Encounter (Signed)
Called spoke with Seth Bake they were requesting additional information regarding patient Botox. patient will have to use [facility]. They will contact patient for consent to mail medication to our office. Seth Bake will send information over to pharmacy for completion.    [phone] [facility]  Fax# [phone] to send Rx

## 2019-02-23 NOTE — Telephone Encounter (Signed)
See below for PA

## 2019-02-23 NOTE — Telephone Encounter (Signed)
Left message with the after hour service on 02-22-19 @ 5:39 pm    [facility]on Pier is calling regarding Botox medication  Option 3 Case [id number]

## 2019-02-24 NOTE — Progress Notes (Signed)
Received hard fax from [place] regarding patient Botox  BOTOX  UNITS VIAL APPROVED Valid until 04/26/2020   This approval may have safety related or quantity limitations. An additional approval may be required in future prescriptions exceed these limitations. For further drug information, please refer to our formulary.   [phone]  Sent to be scan

## 2019-02-24 NOTE — Progress Notes (Signed)
Ileene Rubens Key: [id number] - PA Case ID: [id number] Need help? Call us at [phone] Outcome Approvedon October 29 PA Case: [id number], Status: Approved, Coverage Starts on: 02/23/2019 12:00:00 AM, Coverage Ends on: 04/26/2020 12:00:00 AM.

## 2019-02-27 NOTE — Telephone Encounter (Signed)
Spoke with Michelle Walsh telling her what to expect. That the specialty pharmacy should call her to set up an account and delivery of Botox. She must pay the copay for them to send it and she must authorize the delivery then they will call us to verify address and office hours before it is shipped. I asked her to call us if they have not called in the next few weeks because waiting to January is not a good idea due to weather and holiday delivery overload. She verbalized understanding and said she will call the number on the back of her insurance card if she does not hear from them in the next few week and then [place] if she is unable to set up delivery.

## 2019-02-28 NOTE — Telephone Encounter (Signed)
Noted! Thank you

## 2019-03-03 ENCOUNTER — Telehealth: Payer: Self-pay | Admitting: Neurology

## 2019-03-03 NOTE — Telephone Encounter (Signed)
No, primidone wouldn't cause that so don't think that changing dosing would really help.  Re: easily crying will need to f/u with PCP.  Primidone doesn't contribute to that

## 2019-03-03 NOTE — Telephone Encounter (Signed)
Pt is aware and has no questions or concerns at this time  

## 2019-03-03 NOTE — Telephone Encounter (Signed)
Patient is calling in about the Botox being too expensive for her with her insurance. It was going to be a 180$ copay for Botox. She wanted to know if there was anything else she could do to help with her Dystonia. Thanks!

## 2019-03-03 NOTE — Telephone Encounter (Signed)
Please advise 

## 2019-03-03 NOTE — Telephone Encounter (Signed)
Patient will delay botox until after the first of the year, money is tight right now. She also would like to know if Primidone could be causing her to have vivid dreams where her body physically does what she is dreaming. Also she is crying easily. She wants to know if she can do half tablet bid instead of a whole tablet?

## 2019-03-03 NOTE — Telephone Encounter (Signed)
Unfortunately, I don't.  There is not really anything else that effectively treats this.  While I totally recognize the cost issue, it does last 3 months, so it comes out to $60 per month (which I know still can be expensive).

## 2019-03-08 ENCOUNTER — Encounter: Payer: Self-pay | Admitting: Family Medicine

## 2019-03-08 ENCOUNTER — Ambulatory Visit (INDEPENDENT_AMBULATORY_CARE_PROVIDER_SITE_OTHER): Payer: PPO | Admitting: Family Medicine

## 2019-03-08 ENCOUNTER — Other Ambulatory Visit: Payer: Self-pay

## 2019-03-08 VITALS — BP 122/64 | HR 56 | Temp 97.4°F | Wt 136.8 lb

## 2019-03-08 DIAGNOSIS — F418 Other specified anxiety disorders: Secondary | ICD-10-CM | POA: Diagnosis not present

## 2019-03-08 DIAGNOSIS — R251 Tremor, unspecified: Secondary | ICD-10-CM

## 2019-03-08 DIAGNOSIS — F439 Reaction to severe stress, unspecified: Secondary | ICD-10-CM | POA: Diagnosis not present

## 2019-03-08 MED ORDER — BUSPIRONE HCL 10 MG PO TABS: 10.0000 mg | ORAL_TABLET | Freq: Two times a day (BID) | ORAL | 2 refills | Status: DC

## 2019-03-08 NOTE — Progress Notes (Signed)
Subjective:    Patient ID: Michelle Walsh, female    DOB: 08-04-1955, 63 y.o.   MRN: [id number]  HPI   Patient presents to clinic due to worsening stress and anxiety.  Patient states she feels exhausted at times, extremely stressed and anxious.  Patient was wondering if crying the drop of a hat could be related to her primidone, but neurologist confirmed that this medication would not cause her symptoms.    Primidone has been quite helpful for patient regards to her tremors, she is happy to report her tremors are much improved and she has been able to do that were hard for her before such as peeling potatoes.  Patient has a lot of stress at home.  She has a elderly mother who she cares for, and also her daughter and 4 grandchildren live with her.  States she is happy to have her family with her, but it has been difficult dealing with everyone.  States a lot of times she will go to her bedroom on 7:30 at night just to have some time to herself and to get away.  Patient states she raised her children, and is looking forward to retirement, but now feels that she is raising children all over again.   Patient Active Problem List   Diagnosis Date Noted  . Tremor 12/21/2018  . Vitamin D deficiency 12/21/2018  . Cerebellar ataxia in diseases classified elsewhere ([place]) 12/21/2018  . Osteoarthritis of multiple joints 12/21/2018  . Chronic pain of right knee 12/21/2018  . Depression with anxiety 12/21/2018   Social History   Tobacco Use  . Smoking status: Never Smoker  . Smokeless tobacco: Never Used  Substance Use Topics  . Alcohol use: Never    Frequency: Never    Comment: very rare - 1 time per year     Review of Systems  Constitutional: Negative for chills, fatigue and fever.  HENT: Negative for congestion, ear pain, sinus pain and sore throat.   Eyes: Negative.   Respiratory: Negative for cough, shortness of breath and wheezing.   Cardiovascular: Negative for chest pain,  palpitations and leg swelling.  Gastrointestinal: Negative for abdominal pain, diarrhea, nausea and vomiting.  Genitourinary: Negative for dysuria, frequency and urgency.  Musculoskeletal: Negative for arthralgias and myalgias.  Skin: Negative for color change, pallor and rash.  Neurological: Negative for syncope, light-headedness and headaches.  Psychiatric/Behavioral: increased anxiety, stress, crying      Objective:   Physical Exam Vitals signs and nursing note reviewed.  Constitutional:      General: She is not in acute distress.    Appearance: She is not toxic-appearing.  HENT:     Head: Normocephalic and atraumatic.  Eyes:     General: No scleral icterus.    Extraocular Movements: Extraocular movements intact.     Conjunctiva/sclera: Conjunctivae normal.     Pupils: Pupils are equal, round, and reactive to light.  Cardiovascular:     Rate and Rhythm: Normal rate and regular rhythm.     Heart sounds: Normal heart sounds.  Pulmonary:     Effort: Pulmonary effort is normal. No respiratory distress.     Breath sounds: Normal breath sounds.  Skin:    General: Skin is warm and dry.     Coloration: Skin is not jaundiced or pale.  Neurological:     Mental Status: She is alert and oriented to person, place, and time. Mental status is at baseline.  Psychiatric:  Mood and Affect: Mood normal.        Behavior: Behavior normal.        Thought Content: Thought content normal.        Judgment: Judgment normal.    Today's Vitals   03/08/19 1047  BP: 122/64  Pulse: (!) 56  Temp: (!) 97.4 F (36.3 C)  TempSrc: Temporal  SpO2: 98%  Weight: 136 lb 12.8 oz (62.1 kg)   Body mass index is 22.76 kg/m.     Assessment & Plan:    Tremor  Depression with anxiety - Plan: busPIRone (BUSPAR) 10 MG tablet  Stress at home  Tremor is well controlled with primidone.  She will continue this and continue to work with neurology.  She will continue Celexa for depression and  anxiety control and we will add on BuSpar twice daily to see if this helps better control stress and anxiety.  Patient given handout outlining what stress can do to the body and this is helpful for patient to see in writing and explains a lot of her symptoms.  Encourage patient to consider seeing a therapist or counselor just to have someone to talk to that is outside of her inner circle of family members.  Patient will follow-up in 4 to 6 weeks for recheck on how she is doing with addition of BuSpar.

## 2019-03-08 NOTE — Patient Instructions (Addendum)

## 2019-03-28 ENCOUNTER — Other Ambulatory Visit: Payer: Self-pay | Admitting: Family

## 2019-03-28 NOTE — Telephone Encounter (Signed)
Medication Refill - Medication: rOPINIRole (REQUIP) 1 MG tablet    Has the patient contacted their pharmacy? Yes.   (Agent: If no, request that the patient contact the pharmacy for the refill.) (Agent: If yes, when and what did the pharmacy advise?)Contact PCP  Preferred Pharmacy (with phone number or street name):  [id number] - , [place] - [address] [phone] (Phone) [phone] (Fax)     Agent: Please be advised that RX refills may take up to 3 business days. We ask that you follow-up with your pharmacy.

## 2019-03-28 NOTE — Telephone Encounter (Signed)
Requested medication (s) are due for refill today: yes  Requested medication (s) are on the active medication list: yes  Last refill:  12/21/2018  Future visit scheduled:no  Notes to clinic:  Last filled by historical provider  Review for refill    Requested Prescriptions  Pending Prescriptions Disp Refills   rOPINIRole (REQUIP) 1 MG tablet       Sig: Take 1 tablet (1 mg total) by mouth at bedtime.     Neurology:  Parkinsonian Agents Passed - 03/28/2019  2:21 PM      Passed - Last BP in normal range    BP Readings from Last 1 Encounters:  03/08/19 122/64         Passed - Valid encounter within last 12 months    Recent Outpatient Visits          2 weeks ago [place] [facility], Jacquelynn Cree, FNP   3 months ago Tremor   [place] [facility], Jacquelynn Cree, FNP      Future Appointments            In 3 weeks [place], Yvetta Coder, [place], [facility]

## 2019-03-29 ENCOUNTER — Ambulatory Visit: Payer: PPO | Admitting: Family Medicine

## 2019-03-31 MED ORDER — ROPINIROLE HCL 1 MG PO TABS: 1.0000 mg | ORAL_TABLET | Freq: Every day | ORAL | 0 refills | Status: DC

## 2019-03-31 NOTE — Telephone Encounter (Signed)
Pt called about status of rOPINIRole refill/ please advise

## 2019-03-31 NOTE — Telephone Encounter (Signed)
Sent to pharmacy 

## 2019-04-19 ENCOUNTER — Ambulatory Visit: Payer: PPO | Admitting: Family

## 2019-04-19 ENCOUNTER — Telehealth: Payer: Self-pay | Admitting: Family

## 2019-04-19 ENCOUNTER — Other Ambulatory Visit: Payer: Self-pay

## 2019-04-19 ENCOUNTER — Ambulatory Visit (INDEPENDENT_AMBULATORY_CARE_PROVIDER_SITE_OTHER): Payer: PPO | Admitting: Family

## 2019-04-19 ENCOUNTER — Encounter: Payer: Self-pay | Admitting: Family

## 2019-04-19 VITALS — Ht 65.0 in | Wt 136.0 lb

## 2019-04-19 DIAGNOSIS — F418 Other specified anxiety disorders: Secondary | ICD-10-CM

## 2019-04-19 DIAGNOSIS — Z1239 Encounter for other screening for malignant neoplasm of breast: Secondary | ICD-10-CM | POA: Diagnosis not present

## 2019-04-19 DIAGNOSIS — Z1211 Encounter for screening for malignant neoplasm of colon: Secondary | ICD-10-CM | POA: Insufficient documentation

## 2019-04-19 DIAGNOSIS — G243 Spasmodic torticollis: Secondary | ICD-10-CM

## 2019-04-19 DIAGNOSIS — Z0000 Encounter for general adult medical examination without abnormal findings: Secondary | ICD-10-CM | POA: Insufficient documentation

## 2019-04-19 NOTE — Patient Instructions (Addendum)
Please call call and schedule your 3D mammogram, bone density scan as discussed.   [place]  [place], [place]   Can you call [facility] and get immunization record.

## 2019-04-19 NOTE — Assessment & Plan Note (Signed)
Suspect overdue, ordered

## 2019-04-19 NOTE — Assessment & Plan Note (Signed)
Improved; continue regimen.

## 2019-04-19 NOTE — Telephone Encounter (Signed)
pt called to confirm injection-Prevnar 13 on 11/17/2018.

## 2019-04-19 NOTE — Assessment & Plan Note (Signed)
Well-controlled at this time, will follow

## 2019-04-19 NOTE — Telephone Encounter (Signed)
Michelle Walsh, I fogrot to put on checkout notes the patient needs follow-up in 3 to 6 months for labs and appointment.

## 2019-04-19 NOTE — Assessment & Plan Note (Addendum)
Ordered.  Patient understands schedule mammogram and bone density. Of note discussed for health maintenance in particular labs and pneumonia vaccine being quite overdue.  Patient will let me know when her last pneumonia vaccine was and she will return in the spring for labs.

## 2019-04-19 NOTE — Progress Notes (Signed)
Virtual Visit via Video Note  I connected with@  on 04/19/19 at 10:00 AM EST by a video enabled telemedicine application and verified that I am speaking with the correct person using two identifiers.  Location patient: home Location provider:work  Persons participating in the virtual visit: patient, provider  I discussed the limitations of evaluation and management by telemedicine and the availability of in person appointments. The patient expressed understanding and agreed to proceed.   HPI:  Follow up Anxiety-on Celexa, BuSpar started twice a day a month ago. 'feels like personality is back'. Anxiety much improved. Feels 'very happy.'  Tremor is well controlled with primidone. Much improvement in hands.  She follows with neurology, Dr Tat; last OV 02/17/19. Holding off on Botox for now.   Believes last colonoscopy approx 4 years ago.  Believes also that she has had a pneumonia vaccine in the past year.   ROS: See pertinent positives and negatives per HPI.  Past Medical History:  Diagnosis Date  . Anxiety   . Chronic pain of right knee   . Depression   . Osteoarthritis   . Restless leg syndrome   . Tremors of nervous system   . Vitamin D deficiency     Past Surgical History:  Procedure Laterality Date  . CESAREAN SECTION    . teeth all removed      Family History  Problem Relation Age of Onset  . Breast cancer Maternal Grandmother 20  . Seizures Father     SOCIAL HX: non smoker   Current Outpatient Medications:  .  busPIRone (BUSPAR) 10 MG tablet, Take 1 tablet (10 mg total) by mouth 2 (two) times daily., Disp: 60 tablet, Rfl: 2 .  Cholecalciferol (VITAMIN D3) 125 MCG (5000 UT) CAPS, Take 125 mcg by mouth daily., Disp: , Rfl:  .  citalopram (CELEXA) 20 MG tablet, Take 1 tablet (20 mg total) by mouth daily., Disp: 90 tablet, Rfl: 1 .  diclofenac sodium (VOLTAREN) 1 % GEL, , Disp: , Rfl:  .  meloxicam (MOBIC) 7.5 MG tablet, Take 1 tablet (7.5 mg total) by mouth  daily as needed for pain., Disp: 30 tablet, Rfl: 2 .  primidone (MYSOLINE) 50 MG tablet, Take 1 tablet (50 mg total) by mouth 2 (two) times daily., Disp: 180 tablet, Rfl: 1 .  propranolol (INDERAL) 60 MG tablet, 60 mg 2 (two) times daily., Disp: , Rfl:  .  rOPINIRole (REQUIP) 1 MG tablet, Take 1 tablet (1 mg total) by mouth at bedtime., Disp: 90 tablet, Rfl: 0 .  rosuvastatin (CRESTOR) 10 MG tablet, Take 1 tablet (10 mg total) by mouth daily., Disp: 90 tablet, Rfl: 3  EXAM:  VITALS per patient if applicable:  GENERAL: alert, oriented, appears well and in no acute distress  HEENT: atraumatic, conjunttiva clear, no obvious abnormalities on inspection of external nose and ears  NECK: normal movements of the head and neck  LUNGS: on inspection no signs of respiratory distress, breathing rate appears normal, no obvious gross SOB, gasping or wheezing  CV: no obvious cyanosis  MS: moves all visible extremities without noticeable abnormality  PSYCH/NEURO: pleasant and cooperative, no obvious depression or anxiety, speech and thought processing grossly intact  ASSESSMENT AND PLAN:  Discussed the following assessment and plan:  Depression with anxiety  Encounter for screening for malignant neoplasm of breast, unspecified screening modality - Plan: 3D mammogram- MM SCREENING BREAST TOMO BILATERAL, DG Bone Density  Screen for colon cancer - Plan: Colonoscopy-Ambulatory referral to Gastroenterology-45 to  75 years  Cervical dystonia Problem List Items Addressed This Visit      Nervous and Auditory   Cervical dystonia    Well-controlled at this time, will follow        Other   Depression with anxiety - Primary    Improved; continue regimen.       Screen for colon cancer    Suspect overdue, ordered      Relevant Orders   Colonoscopy-Ambulatory referral to Gastroenterology-45 to 22 years   Screening for breast cancer    Ordered.  Patient understands schedule mammogram and bone  density. Of note discussed for health maintenance in particular labs and pneumonia vaccine being quite overdue.  Patient will let me know when her last pneumonia vaccine was and she will return in the spring for labs.      Relevant Orders   3D mammogram- MM SCREENING BREAST TOMO BILATERAL   DG Bone Density      -we discussed possible serious and likely etiologies, options for evaluation and workup, limitations of telemedicine visit vs in person visit, treatment, treatment risks and precautions. Pt prefers to treat via telemedicine empirically rather then risking or undertaking an in person visit at this moment. Patient agrees to seek prompt in person care if worsening, new symptoms arise, or if is not improving with treatment.   I discussed the assessment and treatment plan with the patient. The patient was provided an opportunity to ask questions and all were answered. The patient agreed with the plan and demonstrated an understanding of the instructions.   The patient was advised to call back or seek an in-person evaluation if the symptoms worsen or if the condition fails to improve as anticipated.   Michelle Paris, FNP

## 2019-04-20 NOTE — Telephone Encounter (Signed)
I have documented in patient's chart. 

## 2019-04-25 ENCOUNTER — Telehealth: Payer: Self-pay | Admitting: Family

## 2019-04-25 NOTE — Telephone Encounter (Signed)
SHEVA from [facility], calling about a referral for this patient. Please call her at [phone], Ext [phone], please call her

## 2019-05-05 ENCOUNTER — Ambulatory Visit: Payer: PPO | Admitting: Neurology

## 2019-05-12 ENCOUNTER — Encounter: Payer: Self-pay | Admitting: Family

## 2019-06-20 ENCOUNTER — Other Ambulatory Visit: Payer: Self-pay | Admitting: Family Medicine

## 2019-06-20 ENCOUNTER — Telehealth: Payer: Self-pay | Admitting: Family

## 2019-06-20 DIAGNOSIS — F418 Other specified anxiety disorders: Secondary | ICD-10-CM

## 2019-06-20 MED ORDER — BUSPIRONE HCL 10 MG PO TABS: 10.0000 mg | ORAL_TABLET | Freq: Two times a day (BID) | ORAL | 2 refills | Status: DC

## 2019-06-20 NOTE — Telephone Encounter (Signed)
Pt needs refill on busPIRone (BUSPAR) 10 MG tablet. Pt only has a few left. Pt has appt on 3/29

## 2019-06-26 ENCOUNTER — Ambulatory Visit: Payer: PPO | Admitting: Neurology

## 2019-06-29 ENCOUNTER — Ambulatory Visit
Admission: RE | Admit: 2019-06-29 | Discharge: 2019-06-29 | Disposition: A | Discharge status: Home / Self Care | Payer: PPO | Source: Ambulatory Visit | Attending: Family | Admitting: Family

## 2019-06-29 DIAGNOSIS — Z1239 Encounter for other screening for malignant neoplasm of breast: Secondary | ICD-10-CM

## 2019-06-29 DIAGNOSIS — Z13820 Encounter for screening for osteoporosis: Secondary | ICD-10-CM | POA: Insufficient documentation

## 2019-06-29 DIAGNOSIS — Z1231 Encounter for screening mammogram for malignant neoplasm of breast: Secondary | ICD-10-CM | POA: Diagnosis not present

## 2019-06-29 DIAGNOSIS — M8589 Other specified disorders of bone density and structure, multiple sites: Secondary | ICD-10-CM | POA: Insufficient documentation

## 2019-07-17 ENCOUNTER — Telehealth: Payer: Self-pay

## 2019-07-17 NOTE — Telephone Encounter (Signed)
Please place future lab orders. Thank you!

## 2019-07-18 ENCOUNTER — Other Ambulatory Visit: Payer: Self-pay | Admitting: Family

## 2019-07-18 ENCOUNTER — Other Ambulatory Visit: Payer: Self-pay

## 2019-07-18 ENCOUNTER — Other Ambulatory Visit (INDEPENDENT_AMBULATORY_CARE_PROVIDER_SITE_OTHER): Payer: PPO

## 2019-07-18 DIAGNOSIS — F418 Other specified anxiety disorders: Secondary | ICD-10-CM | POA: Diagnosis not present

## 2019-07-18 LAB — COMPREHENSIVE METABOLIC PANEL
ALT: 13 U/L (ref 0–35)
AST: 14 U/L (ref 0–37)
Albumin: 4.2 g/dL (ref 3.5–5.2)
Alkaline Phosphatase: 61 U/L (ref 39–117)
BUN: 19 mg/dL (ref 6–23)
CO2: 33 mEq/L — ABNORMAL HIGH (ref 19–32)
Calcium: 9.1 mg/dL (ref 8.4–10.5)
Chloride: 102 mEq/L (ref 96–112)
Creatinine, Ser: 1.1 mg/dL (ref 0.40–1.20)
GFR: 49.73 mL/min — ABNORMAL LOW (ref >60.00)
Glucose, Bld: 74 mg/dL (ref 70–99)
Potassium: 4.4 mEq/L (ref 3.5–5.1)
Sodium: 139 mEq/L (ref 135–145)
Total Bilirubin: 0.5 mg/dL (ref 0.2–1.2)
Total Protein: 6.5 g/dL (ref 6.0–8.3)

## 2019-07-18 LAB — LIPID PANEL
Cholesterol: 109 mg/dL (ref 0–200)
HDL: 37 mg/dL — ABNORMAL LOW (ref >39.00)
LDL Cholesterol: 61 mg/dL (ref 0–99)
NonHDL: 72.39
Total CHOL/HDL Ratio: 3
Triglycerides: 58 mg/dL (ref 0.0–149.0)
VLDL: 11.6 mg/dL (ref 0.0–40.0)

## 2019-07-18 LAB — HEMOGLOBIN A1C: Hgb A1c MFr Bld: 5.7 % (ref 4.6–6.5)

## 2019-07-19 ENCOUNTER — Other Ambulatory Visit: Payer: Self-pay | Admitting: Family

## 2019-07-19 DIAGNOSIS — R899 Unspecified abnormal finding in specimens from other organs, systems and tissues: Secondary | ICD-10-CM

## 2019-07-21 ENCOUNTER — Ambulatory Visit: Payer: PPO | Admitting: Family

## 2019-07-24 ENCOUNTER — Encounter: Payer: Self-pay | Admitting: Family

## 2019-07-24 ENCOUNTER — Telehealth (INDEPENDENT_AMBULATORY_CARE_PROVIDER_SITE_OTHER): Payer: PPO | Admitting: Family

## 2019-07-24 ENCOUNTER — Other Ambulatory Visit: Payer: Self-pay

## 2019-07-24 VITALS — Ht 65.0 in | Wt 137.0 lb

## 2019-07-24 DIAGNOSIS — M8580 Other specified disorders of bone density and structure, unspecified site: Secondary | ICD-10-CM | POA: Diagnosis not present

## 2019-07-24 DIAGNOSIS — N814 Uterovaginal prolapse, unspecified: Secondary | ICD-10-CM | POA: Diagnosis not present

## 2019-07-24 DIAGNOSIS — R251 Tremor, unspecified: Secondary | ICD-10-CM

## 2019-07-24 DIAGNOSIS — F418 Other specified anxiety disorders: Secondary | ICD-10-CM | POA: Diagnosis not present

## 2019-07-24 DIAGNOSIS — G250 Essential tremor: Secondary | ICD-10-CM | POA: Insufficient documentation

## 2019-07-24 MED ORDER — ALENDRONATE SODIUM 70 MG PO TABS: 70.0000 mg | ORAL_TABLET | ORAL | 11 refills | Status: DC

## 2019-07-24 NOTE — Progress Notes (Signed)
Virtual Visit via Video Note  I connected with@  on 07/24/19 at 10:30 AM EDT by a video enabled telemedicine application and verified that I am speaking with the correct person using two identifiers.  Location patient: home Location provider:work  Persons participating in the virtual visit: patient, provider  I discussed the limitations of evaluation and management by telemedicine and the availability of in person appointments. The patient expressed understanding and agreed to proceed.   HPI: Discuss bone density  Taking vit D 5000 units. Not taking calcium No trouble swallowing. Wear dentures. No planned dental work.   GAD - feels well on buspar , celexa  Left hand tremor some worse on primidone 50 mg bid. She is on propranolol.   CKD- slight decrease. Drinking water only with medicine, Drinks pepsi all day.   She has concern for 'prolapse'.   Dr Carles Collet 10/20.    ROS: See pertinent positives and negatives per HPI.  Past Medical History:  Diagnosis Date  . Anxiety   . Chronic pain of right knee   . Depression   . Osteoarthritis   . Restless leg syndrome   . Tremors of nervous system   . Vitamin D deficiency     Past Surgical History:  Procedure Laterality Date  . CESAREAN SECTION    . teeth all removed      Family History  Problem Relation Age of Onset  . Breast cancer Maternal Grandmother 50  . Seizures Father       Current Outpatient Medications:  .  busPIRone (BUSPAR) 10 MG tablet, Take 1 tablet (10 mg total) by mouth 2 (two) times daily., Disp: 60 tablet, Rfl: 2 .  Cholecalciferol (VITAMIN D3) 125 MCG (5000 UT) CAPS, Take 125 mcg by mouth daily., Disp: , Rfl:  .  citalopram (CELEXA) 20 MG tablet, Take 1 tablet (20 mg total) by mouth daily., Disp: 90 tablet, Rfl: 1 .  diclofenac sodium (VOLTAREN) 1 % GEL, , Disp: , Rfl:  .  meloxicam (MOBIC) 7.5 MG tablet, Take 1 tablet (7.5 mg total) by mouth daily as needed for pain., Disp: 30 tablet, Rfl: 2 .  primidone  (MYSOLINE) 50 MG tablet, Take 1 tablet (50 mg total) by mouth 2 (two) times daily., Disp: 180 tablet, Rfl: 1 .  propranolol (INDERAL) 60 MG tablet, 60 mg 2 (two) times daily., Disp: , Rfl:  .  rOPINIRole (REQUIP) 1 MG tablet, TAKE 1 TABLET (1 MG TOTAL) BY MOUTH AT BEDTIME., Disp: 90 tablet, Rfl: 0 .  rosuvastatin (CRESTOR) 10 MG tablet, Take 1 tablet (10 mg total) by mouth daily., Disp: 90 tablet, Rfl: 3 .  alendronate (FOSAMAX) 70 MG tablet, Take 1 tablet (70 mg total) by mouth every 7 (seven) days. Take with a full glass of water on an empty stomach., Disp: 4 tablet, Rfl: 11  EXAM:  VITALS per patient if applicable:  GENERAL: alert, oriented, appears well and in no acute distress  HEENT: atraumatic, conjunttiva clear, no obvious abnormalities on inspection of external nose and ears  NECK: normal movements of the head and neck  LUNGS: on inspection no signs of respiratory distress, breathing rate appears normal, no obvious gross SOB, gasping or wheezing  CV: no obvious cyanosis  MS: moves all visible extremities without noticeable abnormality  PSYCH/NEURO: pleasant and cooperative, no obvious depression or anxiety, speech and thought processing grossly intact. Obvious tremor in neck seen over video.   ASSESSMENT AND PLAN:  Discussed the following assessment and plan:  Osteopenia,  unspecified location - Plan: Ambulatory referral to Obstetrics / Gynecology, alendronate (FOSAMAX) 70 MG tablet  Depression with anxiety  Tremor of left hand  Uterine prolapse Problem List Items Addressed This Visit      Musculoskeletal and Integument   Osteopenia - Primary    Discussed DEXA scan and treatment options.  Patient would like to trial Fosamax.  Crt Cl 50.  normal calcium.  Discussed optimization of vitamin D, calcium.  Counseled patient on risk of medication including long bone fracture, jaw necrosis, esophagitis.  Discussed at length about how to take medication properly.  I have mailed  her AVS to her so she can read about Fosamax.  She will let me know any questions or concerns.  Follow-up in 3 months      Relevant Medications   alendronate (FOSAMAX) 70 MG tablet   Other Relevant Orders   Ambulatory referral to Obstetrics / Gynecology     Genitourinary   Uterine prolapse    No physical exam as this was a virtual video today.  Advised that patient will need further evaluation for OB/GYN.  She is agreeable to this and referral has been placed        Other   Depression with anxiety    Stable, continue current regimen      Tremor of left hand    Some worsening of late.  Patient is currently on propranolol, primidone.  Advised her to follow-up with neurology, Dr. Hall Busing.  She is due for this and states she will make an appointment         -we discussed possible serious and likely etiologies, options for evaluation and workup, limitations of telemedicine visit vs in person visit, treatment, treatment risks and precautions. Pt prefers to treat via telemedicine empirically rather then risking or undertaking an in person visit at this moment. Patient agrees to seek prompt in person care if worsening, new symptoms arise, or if is not improving with treatment.   I discussed the assessment and treatment plan with the patient. The patient was provided an opportunity to ask questions and all were answered. The patient agreed with the plan and demonstrated an understanding of the instructions.   The patient was advised to call back or seek an in-person evaluation if the symptoms worsen or if the condition fails to improve as anticipated.   Michelle Paris, FNP

## 2019-07-24 NOTE — Assessment & Plan Note (Addendum)
Discussed DEXA scan and treatment options.  Patient would like to trial Fosamax.  Crt Cl 50.  normal calcium.  Discussed optimization of vitamin D, calcium.  Counseled patient on risk of medication including long bone fracture, jaw necrosis, esophagitis.  Discussed at length about how to take medication properly.  I have mailed her AVS to her so she can read about Fosamax.  She will let me know any questions or concerns.  Follow-up in 3 months

## 2019-07-24 NOTE — Assessment & Plan Note (Signed)
Stable, continue current regimen 

## 2019-07-24 NOTE — Assessment & Plan Note (Signed)
No physical exam as this was a virtual video today.  Advised that patient will need further evaluation for OB/GYN.  She is agreeable to this and referral has been placed

## 2019-07-24 NOTE — Patient Instructions (Addendum)
Drink MORE water.  Start fosamax.   For post menopausal women, guidelines recommend a diet with 1200 mg of Calcium per day. If you are eating calcium rich foods, you do not need a calcium supplement. The body better absorbs the calcium that you eat over supplementation. If you do supplement, I recommend not supplementing the full 1200 mg/ day as this can lead to increased risk of cardiovascular disease. I recommend Calcium Citrate over the counter, and you may take a total of 600 to 800 mg per day in divided doses with meals for best absorption.   For bone health, you need adequate vitamin D, and I recommend you supplement as it is harder to do so with diet alone. I recommend cholecalciferol 800 units daily.  Also, please ensure you are following a diet high in calcium -- research shows better outcomes with dietary sources including kale, yogurt, broccolii, cheese, okra, almonds- to name a few.     Also remember that exercise is a great medicine for maintain and preserve bone health. Advise moderate exercise for 30 minutes , 3 times per week.   Fosamax / Alendronate tablets What is this medicine? ALENDRONATE (a LEN droe nate) slows calcium loss from bones. It helps to make normal healthy bone and to slow bone loss in people with Paget's disease and osteoporosis. It may be used in others at risk for bone loss. This medicine may be used for other purposes; ask your health care provider or pharmacist if you have questions. COMMON BRAND NAME(S): Fosamax What should I tell my health care provider before I take this medicine? They need to know if you have any of these conditions:  dental disease  esophagus, stomach, or intestine problems, like acid reflux or GERD  kidney disease  low blood calcium  low vitamin D  problems sitting or standing 30 minutes  trouble swallowing  an unusual or allergic reaction to alendronate, other medicines, foods, dyes, or preservatives  pregnant or trying to  get pregnant  breast-feeding How should I use this medicine? You must take this medicine exactly as directed or you will lower the amount of the medicine you absorb into your body or you may cause yourself harm. Take this medicine by mouth first thing in the morning, after you are up for the day. Do not eat or drink anything before you take your medicine. Swallow the tablet with a full glass (6 to 8 fluid ounces) of plain water. Do not take this medicine with any other drink. Do not chew or crush the tablet. After taking this medicine, do not eat breakfast, drink, or take any medicines or vitamins for at least 30 minutes. Sit or stand up for at least 30 minutes after you take this medicine; do not lie down. Do not take your medicine more often than directed. Talk to your pediatrician regarding the use of this medicine in children. Special care may be needed. Overdosage: If you think you have taken too much of this medicine contact a poison control center or emergency room at once. NOTE: This medicine is only for you. Do not share this medicine with others. What if I miss a dose? If you miss a dose, do not take it later in the day. Continue your normal schedule starting the next morning. Do not take double or extra doses. What may interact with this medicine?  aluminum hydroxide  antacids  aspirin  calcium supplements  drugs for inflammation like ibuprofen, naproxen, and others  iron  supplements  magnesium supplements  vitamins with minerals This list may not describe all possible interactions. Give your health care provider a list of all the medicines, herbs, non-prescription drugs, or dietary supplements you use. Also tell them if you smoke, drink alcohol, or use illegal drugs. Some items may interact with your medicine. What should I watch for while using this medicine? Visit your doctor or health care professional for regular checks ups. It may be some time before you see benefit from  this medicine. Do not stop taking your medicine except on your doctor's advice. Your doctor or health care professional may order blood tests and other tests to see how you are doing. You should make sure you get enough calcium and vitamin D while you are taking this medicine, unless your doctor tells you not to. Discuss the foods you eat and the vitamins you take with your health care professional. Some people who take this medicine have severe bone, joint, and/or muscle pain. This medicine may also increase your risk for a broken thigh bone. Tell your doctor right away if you have pain in your upper leg or groin. Tell your doctor if you have any pain that does not go away or that gets worse. This medicine can make you more sensitive to the sun. If you get a rash while taking this medicine, sunlight may cause the rash to get worse. Keep out of the sun. If you cannot avoid being in the sun, wear protective clothing and use sunscreen. Do not use sun lamps or tanning beds/booths. What side effects may I notice from receiving this medicine? Side effects that you should report to your doctor or health care professional as soon as possible:  allergic reactions like skin rash, itching or hives, swelling of the face, lips, or tongue  black or tarry stools  bone, muscle or joint pain  changes in vision  chest pain  heartburn or stomach pain  jaw pain, especially after dental work  pain or trouble when swallowing  redness, blistering, peeling or loosening of the skin, including inside the mouth Side effects that usually do not require medical attention (report to your doctor or health care professional if they continue or are bothersome):  changes in taste  diarrhea or constipation  eye pain or itching  headache  nausea or vomiting  stomach gas or fullness This list may not describe all possible side effects. Call your doctor for medical advice about side effects. You may report side  effects to FDA at 1-800-FDA-1088. Where should I keep my medicine? Keep out of the reach of children. Store at room temperature of 15 and 30 degrees C (59 and 86 degrees F). Throw away any unused medicine after the expiration date. NOTE: This sheet is a summary. It may not cover all possible information. If you have questions about this medicine, talk to your doctor, pharmacist, or health care provider.  2020 Elsevier/Gold Standard (2010-10-10 08:56:09)

## 2019-07-24 NOTE — Assessment & Plan Note (Signed)
Some worsening of late.  Patient is currently on propranolol, primidone.  Advised her to follow-up with neurology, Dr. Hall Busing.  She is due for this and states she will make an appointment

## 2019-08-08 ENCOUNTER — Encounter: Payer: Self-pay | Admitting: Obstetrics and Gynecology

## 2019-08-09 ENCOUNTER — Ambulatory Visit: Payer: PPO | Admitting: Obstetrics and Gynecology

## 2019-08-09 ENCOUNTER — Other Ambulatory Visit (HOSPITAL_COMMUNITY)
Admission: RE | Admit: 2019-08-09 | Discharge: 2019-08-09 | Disposition: A | Discharge status: Home / Self Care | Payer: PPO | Source: Ambulatory Visit | Attending: Obstetrics and Gynecology | Admitting: Obstetrics and Gynecology

## 2019-08-09 ENCOUNTER — Other Ambulatory Visit: Payer: Self-pay

## 2019-08-09 ENCOUNTER — Encounter: Payer: Self-pay | Admitting: Obstetrics and Gynecology

## 2019-08-09 VITALS — BP 103/63 | HR 50 | Ht 65.0 in | Wt 135.1 lb

## 2019-08-09 DIAGNOSIS — Z1151 Encounter for screening for human papillomavirus (HPV): Secondary | ICD-10-CM | POA: Diagnosis not present

## 2019-08-09 DIAGNOSIS — Z780 Asymptomatic menopausal state: Secondary | ICD-10-CM | POA: Diagnosis not present

## 2019-08-09 DIAGNOSIS — Z124 Encounter for screening for malignant neoplasm of cervix: Secondary | ICD-10-CM | POA: Diagnosis not present

## 2019-08-09 NOTE — Progress Notes (Signed)
HPI:      [name] is a 64 y.o. No obstetric history on file. who LMP was No LMP recorded. Patient is postmenopausal.  Subjective:   She presents today for a change in the vulva/vaginal area that the patient noticed that she thought was prolapse.  She had spoken to some of her friends about this and she felt as if if she did not get it further examined it was likely to all just "fall out". She reports no problems with bowel movements.  She occasionally has difficulty getting her urine stream started but no problems after she begins urinating.  She says that she sometimes just leans to the side.  She reports no issues with pelvic pressure/back pain.  She does complain of occasional urine loss with coughing laughing and sneezing but this is not a significant issue for her. She is not sexually active and has no difficulty with intercourse. She states that when she has an examination she has a very small vaginal opening and a small speculum must be used.    Hx: The following portions of the patient's history were reviewed and updated as appropriate:             She  has a past medical history of Anxiety, Chronic pain of right knee, Depression, Osteoarthritis, Restless leg syndrome, Tremors of nervous system, and Vitamin D deficiency. She does not have any pertinent problems on file. She  has a past surgical history that includes Cesarean section and teeth all removed. Her family history includes Breast cancer (age of onset: 58) in her maternal grandmother; Seizures in her father. She  reports that she has never smoked. She has never used smokeless tobacco. She reports current drug use. Drug: Marijuana. She reports that she does not drink alcohol. She has a current medication list which includes the following prescription(s): alendronate, buspirone, vitamin d3, citalopram, diclofenac sodium, meloxicam, primidone, propranolol, ropinirole, and rosuvastatin. She is allergic to ciprofloxacin and  codeine.       Review of Systems:  Review of Systems  Constitutional: Denied constitutional symptoms, night sweats, recent illness, fatigue, fever, insomnia and weight loss.  Eyes: Denied eye symptoms, eye pain, photophobia, vision change and visual disturbance.  Ears/Nose/Throat/Neck: Denied ear, nose, throat or neck symptoms, hearing loss, nasal discharge, sinus congestion and sore throat.  Cardiovascular: Denied cardiovascular symptoms, arrhythmia, chest pain/pressure, edema, exercise intolerance, orthopnea and palpitations.  Respiratory: Denied pulmonary symptoms, asthma, pleuritic pain, productive sputum, cough, dyspnea and wheezing.  Gastrointestinal: Denied, gastro-esophageal reflux, melena, nausea and vomiting.  Genitourinary: Denied genitourinary symptoms including symptomatic vaginal discharge, pelvic relaxation issues, and urinary complaints.  Musculoskeletal: Denied musculoskeletal symptoms, stiffness, swelling, muscle weakness and myalgia.  Dermatologic: Denied dermatology symptoms, rash and scar.  Neurologic: Denied neurology symptoms, dizziness, headache, neck pain and syncope.  Psychiatric: Denied psychiatric symptoms, anxiety and depression.  Endocrine: Denied endocrine symptoms including hot flashes and night sweats.   Meds:   Current Outpatient Medications on File Prior to Visit  Medication Sig Dispense Refill  . alendronate (FOSAMAX) 70 MG tablet Take 1 tablet (70 mg total) by mouth every 7 (seven) days. Take with a full glass of water on an empty stomach. 4 tablet 11  . busPIRone (BUSPAR) 10 MG tablet Take 1 tablet (10 mg total) by mouth 2 (two) times daily. 60 tablet 2  . Cholecalciferol (VITAMIN D3) 125 MCG (5000 UT) CAPS Take 125 mcg by mouth daily.    . citalopram (CELEXA) 20 MG tablet Take  1 tablet (20 mg total) by mouth daily. 90 tablet 1  . diclofenac sodium (VOLTAREN) 1 % GEL     . meloxicam (MOBIC) 7.5 MG tablet Take 1 tablet (7.5 mg total) by mouth daily as  needed for pain. 30 tablet 2  . primidone (MYSOLINE) 50 MG tablet Take 1 tablet (50 mg total) by mouth 2 (two) times daily. 180 tablet 1  . propranolol (INDERAL) 60 MG tablet 60 mg 2 (two) times daily.    Marland Kitchen rOPINIRole (REQUIP) 1 MG tablet TAKE 1 TABLET (1 MG TOTAL) BY MOUTH AT BEDTIME. 90 tablet 0  . rosuvastatin (CRESTOR) 10 MG tablet Take 1 tablet (10 mg total) by mouth daily. 90 tablet 3   No current facility-administered medications on file prior to visit.    Objective:     Vitals:   08/09/19 0939  BP: 103/63  Pulse: (!) 50              Physical examination   Pelvic:   Vulva: Normal appearance.  No lesions.  Small introitus  Vagina: No lesions or abnormalities noted.  Support: Normal pelvic support for her age.  Minimal cystocele and uterine descensus.  Urethra No masses tenderness or scarring.  Urethral meatus is somewhat prominent and I believe this may be what she was initially concerned with.  No evidence of urethral diverticulum  Meatus Normal size without lesions or prolapse.  Cervix: Normal appearance.  No lesions.  Anus: Normal exam.  No lesions.  Perineum: Normal exam.  No lesions.        Bimanual   Uterus: Normal size.  Non-tender.  Mobile.  AV.  Adnexae: No masses.  Non-tender to palpation.  Cul-de-sac: Negative for abnormality.     Assessment:    No obstetric history on file. Patient Active Problem List   Diagnosis Date Noted  . Osteopenia 07/24/2019  . Tremor of left hand 07/24/2019  . Uterine prolapse 07/24/2019  . Screening for breast cancer 04/19/2019  . Screen for colon cancer 04/19/2019  . Cervical dystonia 12/21/2018  . Vitamin D deficiency 12/21/2018  . 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 Osteoarthritis of multiple joints 12/21/2018  . Chronic pain of right knee 12/21/2018  . Depression with anxiety 12/21/2018     1. Screening for cervical cancer     I performed a Pap smear because she has not had 1 in  many years and the cervix was easily accessible.  There is minimal evidence of uterine/pelvic relaxation or prolapse.  I believe that she mainly was concerned with the appearance of her urethra.  I also believe this is normal in appearance.  The fact that she does have a small introitus is useful for patients with prolapse.  It is likely she will never experience any significant issues with pelvic relaxation or prolapse during the remainder of her life based on her current examination.   Plan:            1.  I have reassured her regarding pelvic prolapse.  I have also discussed with her that the main treatment for prolapse is surgical but that use of pessaries is a secondary option.  Since she is not really having any significant issues with prolapse and has minimal evidence of it on examination I do not believe surgery for pessary is necessary.  I think she feels relieved and reassured.  She has been asked to follow-up with [name] for general/routine medical care. Orders No orders  of the defined types were placed in this encounter.   No orders of the defined types were placed in this encounter.     F/U  No follow-ups on file. I spent 33 minutes involved in the care of this patient preparing to see the patient by obtaining and reviewing her medical history (including labs, imaging tests and prior procedures), documenting clinical information in the electronic health record (EHR), counseling and coordinating care plans, writing and sending prescriptions, ordering tests or procedures and directly communicating with the patient by discussing pertinent items from her history and physical exam as well as detailing my assessment and plan as noted above so that she has an informed understanding.  All of her questions were answered.  Finis Bud, M.D. 08/09/2019 10:29 AM

## 2019-08-10 LAB — CYTOLOGY - PAP
Comment: NEGATIVE
Diagnosis: NEGATIVE
High risk HPV: NEGATIVE

## 2019-08-15 ENCOUNTER — Other Ambulatory Visit: Payer: Self-pay | Admitting: Neurology

## 2019-08-15 NOTE — Telephone Encounter (Signed)
Reviewed request with Dr Tat and she declined rx. Patient must contact office for additional refills.

## 2019-08-28 NOTE — Progress Notes (Signed)
Virtual Visit Via Video   The purpose of this virtual visit is to provide medical care while limiting exposure to the novel coronavirus.    Consent was obtained for video visit:  Yes.   Answered questions that patient had about telehealth interaction:  Yes.   I discussed the limitations, risks, security and privacy concerns of performing an evaluation and management service by telemedicine. I also discussed with the patient that there may be a patient responsible charge related to this service. The patient expressed understanding and agreed to proceed.  Pt location: Home Physician Location: home Name of referring provider:  [name], FNP I connected with [name] at patients initiation/request on 08/30/2019 at  8:15 AM EDT by video enabled telemedicine application and verified that I am speaking with the correct person using two identifiers. Pt MRN:  [id number] Pt DOB:  03-Mar-1956 Video Participants:  [name];    Assessment/Plan:    1.  Essential Tremor  -Patient really does have very significant tremor in the left hand, and more mild in the right hand.  Increase primidone, 50 mg, 2 tablets in the morning and continue 1 tablet at night.  Suspect that she is going to need more medication than this, and may need surgical intervention consideration.  -Continue propranolol, 60 mg twice per day  -Surgical interventions discussed  2.  Cervical dystonia  -Discussed Botox again today, and patient states that she really is not interested.  States that she just really wanted an accurate diagnosis.  We can always reentertain this in the future if she changes her mind.  Subjective:   Michelle Walsh was seen today in follow up for essential tremor and cervical dystonia.  My previous records as well as any outside records made available were reviewed prior to todays visit.Last visit, we were going to go ahead and pursue Botox for the dystonia, but she changed her mind so she  never had that.  We did initiate primidone for the essential tremor.  States that it was really helpful initially but seems to have lost efficacy since that time.  No SE.   My previous records were reviewed prior to todays visit. Pt denies falls.  Pt denies lightheadedness, near syncope.  No hallucinations.  Mood has been good.  Current prescribed movement disorder medications: Primidone, 50 mg twice per day Propranolol, 60 mg twice daily (was on prior to seeing me)   ALLERGIES:   Allergies  Allergen Reactions  . Ciprofloxacin     Sick with C.Diff for 2 months after taking medication  . Codeine Nausea And Vomiting    CURRENT MEDICATIONS:  Outpatient Encounter Medications as of 08/30/2019  Medication Sig  . alendronate (FOSAMAX) 70 MG tablet Take 1 tablet (70 mg total) by mouth every 7 (seven) days. Take with a full glass of water on an empty stomach.  . busPIRone (BUSPAR) 10 MG tablet Take 1 tablet (10 mg total) by mouth 2 (two) times daily.  . Cholecalciferol (VITAMIN D3) 125 MCG (5000 UT) CAPS Take 125 mcg by mouth daily.  . citalopram (CELEXA) 20 MG tablet Take 1 tablet (20 mg total) by mouth daily.  . diclofenac sodium (VOLTAREN) 1 % GEL   . meloxicam (MOBIC) 7.5 MG tablet Take 1 tablet (7.5 mg total) by mouth daily as needed for pain.  . primidone (MYSOLINE) 50 MG tablet Take 1 tablet (50 mg total) by mouth 2 (two) times daily.  . propranolol (INDERAL) 60 MG tablet  60 mg 2 (two) times daily.  Marland Kitchen rOPINIRole (REQUIP) 1 MG tablet TAKE 1 TABLET (1 MG TOTAL) BY MOUTH AT BEDTIME.  . rosuvastatin (CRESTOR) 10 MG tablet Take 1 tablet (10 mg total) by mouth daily.   No facility-administered encounter medications on file as of 08/30/2019.     Objective:    PHYSICAL EXAMINATION:    VITALS:   Vitals:   08/29/19 1007  Weight: 136 lb (61.7 kg)  Height: 5\' 5"  (1.651 m)    GEN:  The patient appears stated age and is in NAD. HEENT:  Normocephalic, atraumatic.    Neurological  examination:  Orientation: The patient is alert and oriented x3. Cranial nerves: There is good facial symmetry. The speech is fluent and clear. Marland Kitchen Hearing is intact to conversational tone. Motor: Strength is at least antigravity x4.  Movement examination: Tone: There is normal tone in the UE/LE Abnormal movements: Patient has very significant intention tremor, left much greater than right.  Mild head tilt to the left with mild head tremor, irregular. Coordination:  There is no decremation with RAM's  I have reviewed and interpreted the following labs independently   Chemistry      Component Value Date/Time   NA 139 07/18/2019 0959   NA 139 09/15/2013 1214   K 4.4 07/18/2019 0959   K 3.3 (L) 09/15/2013 1214   CL 102 07/18/2019 0959   CL 103 09/15/2013 1214   CO2 33 (H) 07/18/2019 0959   CO2 31 09/15/2013 1214   BUN 19 07/18/2019 0959   BUN 8 09/15/2013 1214   CREATININE 1.10 07/18/2019 0959   CREATININE 0.89 09/15/2013 1214      Component Value Date/Time   CALCIUM 9.1 07/18/2019 0959   CALCIUM 9.1 09/15/2013 1214   ALKPHOS 61 07/18/2019 0959   ALKPHOS 58 09/15/2013 1214   AST 14 07/18/2019 0959   AST 21 09/15/2013 1214   ALT 13 07/18/2019 0959   ALT 15 09/15/2013 1214   BILITOT 0.5 07/18/2019 0959   BILITOT 0.5 09/15/2013 1214      Lab Results  Component Value Date   WBC 10.1 12/21/2018   HGB 12.1 12/21/2018   HCT 37.0 12/21/2018   MCV 92.0 12/21/2018   PLT 302.0 12/21/2018   Lab Results  Component Value Date   TSH 4.14 12/21/2018     Chemistry      Component Value Date/Time   NA 139 07/18/2019 0959   NA 139 09/15/2013 1214   K 4.4 07/18/2019 0959   K 3.3 (L) 09/15/2013 1214   CL 102 07/18/2019 0959   CL 103 09/15/2013 1214   CO2 33 (H) 07/18/2019 0959   CO2 31 09/15/2013 1214   BUN 19 07/18/2019 0959   BUN 8 09/15/2013 1214   CREATININE 1.10 07/18/2019 0959   CREATININE 0.89 09/15/2013 1214      Component Value Date/Time   CALCIUM 9.1 07/18/2019  0959   CALCIUM 9.1 09/15/2013 1214   ALKPHOS 61 07/18/2019 0959   ALKPHOS 58 09/15/2013 1214   AST 14 07/18/2019 0959   AST 21 09/15/2013 1214   ALT 13 07/18/2019 0959   ALT 15 09/15/2013 1214   BILITOT 0.5 07/18/2019 0959   BILITOT 0.5 09/15/2013 1214         Total time spent on today's visit was 30 minutes, including both face-to-face time and nonface-to-face time.  Time included that spent on review of records (prior notes available to me/labs/imaging if pertinent), discussing treatment and goals, answering  patient's questions and coordinating care.  Cc:  [name], FNP/

## 2019-08-29 ENCOUNTER — Encounter: Payer: Self-pay | Admitting: Neurology

## 2019-08-30 ENCOUNTER — Other Ambulatory Visit: Payer: Self-pay

## 2019-08-30 ENCOUNTER — Telehealth (INDEPENDENT_AMBULATORY_CARE_PROVIDER_SITE_OTHER): Payer: PPO | Admitting: Neurology

## 2019-08-30 ENCOUNTER — Encounter: Payer: Self-pay | Admitting: Neurology

## 2019-08-30 VITALS — Ht 65.0 in | Wt 136.0 lb

## 2019-08-30 DIAGNOSIS — G243 Spasmodic torticollis: Secondary | ICD-10-CM

## 2019-08-30 DIAGNOSIS — G250 Essential tremor: Secondary | ICD-10-CM

## 2019-08-30 MED ORDER — PRIMIDONE 50 MG PO TABS: ORAL_TABLET | ORAL | 1 refills | Status: DC

## 2019-09-11 ENCOUNTER — Telehealth: Payer: Self-pay | Admitting: Family

## 2019-09-11 ENCOUNTER — Other Ambulatory Visit: Payer: Self-pay | Admitting: Family

## 2019-09-11 DIAGNOSIS — F418 Other specified anxiety disorders: Secondary | ICD-10-CM

## 2019-09-11 MED ORDER — PROPRANOLOL HCL 60 MG PO TABS: 60.0000 mg | ORAL_TABLET | Freq: Two times a day (BID) | ORAL | 1 refills | Status: DC

## 2019-09-11 NOTE — Telephone Encounter (Signed)
Pt called her pharmacy and they told her that they have sent [place] several requests for the prescription Propranolol. She said today is her last day and she really needs this prescription. Please send to [facility].

## 2019-09-12 ENCOUNTER — Other Ambulatory Visit: Payer: Self-pay | Admitting: Family Medicine

## 2019-09-15 ENCOUNTER — Telehealth: Payer: Self-pay | Admitting: Family

## 2019-09-15 DIAGNOSIS — F418 Other specified anxiety disorders: Secondary | ICD-10-CM

## 2019-09-15 MED ORDER — CITALOPRAM HYDROBROMIDE 20 MG PO TABS: 20.0000 mg | ORAL_TABLET | Freq: Every day | ORAL | 1 refills | Status: DC

## 2019-09-15 NOTE — Telephone Encounter (Signed)
Pt needs refill on citalopram. She called [facility] early this week to send over request and was under the impression they sent it to [place]. She will be out of medication on Sunday.

## 2019-09-28 ENCOUNTER — Other Ambulatory Visit: Payer: Self-pay

## 2019-09-28 ENCOUNTER — Other Ambulatory Visit (INDEPENDENT_AMBULATORY_CARE_PROVIDER_SITE_OTHER): Payer: PPO

## 2019-09-28 DIAGNOSIS — R899 Unspecified abnormal finding in specimens from other organs, systems and tissues: Secondary | ICD-10-CM | POA: Diagnosis not present

## 2019-09-28 LAB — BASIC METABOLIC PANEL
BUN: 13 mg/dL (ref 6–23)
CO2: 32 mEq/L (ref 19–32)
Calcium: 8.4 mg/dL (ref 8.4–10.5)
Chloride: 101 mEq/L (ref 96–112)
Creatinine, Ser: 0.92 mg/dL (ref 0.40–1.20)
GFR: 61.08 mL/min (ref >60.00)
Glucose, Bld: 87 mg/dL (ref 70–99)
Potassium: 3.7 mEq/L (ref 3.5–5.1)
Sodium: 139 mEq/L (ref 135–145)

## 2019-10-11 ENCOUNTER — Other Ambulatory Visit: Payer: Self-pay | Admitting: Family Medicine

## 2019-10-24 ENCOUNTER — Ambulatory Visit (INDEPENDENT_AMBULATORY_CARE_PROVIDER_SITE_OTHER): Payer: PPO | Admitting: Family

## 2019-10-24 ENCOUNTER — Other Ambulatory Visit: Payer: Self-pay

## 2019-10-24 ENCOUNTER — Encounter: Payer: Self-pay | Admitting: Family

## 2019-10-24 VITALS — BP 118/70 | HR 47 | Temp 97.8°F | Ht 65.0 in | Wt 133.8 lb

## 2019-10-24 DIAGNOSIS — Z0000 Encounter for general adult medical examination without abnormal findings: Secondary | ICD-10-CM | POA: Diagnosis not present

## 2019-10-24 DIAGNOSIS — H9192 Unspecified hearing loss, left ear: Secondary | ICD-10-CM

## 2019-10-24 DIAGNOSIS — R251 Tremor, unspecified: Secondary | ICD-10-CM

## 2019-10-24 DIAGNOSIS — H9190 Unspecified hearing loss, unspecified ear: Secondary | ICD-10-CM | POA: Insufficient documentation

## 2019-10-24 DIAGNOSIS — F418 Other specified anxiety disorders: Secondary | ICD-10-CM | POA: Diagnosis not present

## 2019-10-24 DIAGNOSIS — G2581 Restless legs syndrome: Secondary | ICD-10-CM | POA: Insufficient documentation

## 2019-10-24 DIAGNOSIS — M8580 Other specified disorders of bone density and structure, unspecified site: Secondary | ICD-10-CM

## 2019-10-24 LAB — VITAMIN D 25 HYDROXY (VIT D DEFICIENCY, FRACTURES): VITD: 93.1 ng/mL (ref 30.00–100.00)

## 2019-10-24 LAB — TSH: TSH: 2.04 u[IU]/mL (ref 0.35–4.50)

## 2019-10-24 MED ORDER — PROPRANOLOL HCL 40 MG PO TABS: 40.0000 mg | ORAL_TABLET | Freq: Two times a day (BID) | ORAL | 1 refills | Status: DC

## 2019-10-24 NOTE — Assessment & Plan Note (Addendum)
Declines CBE and pelvic exam as recently done by Dr Amalia Hailey. Colonoscopy ordered.

## 2019-10-24 NOTE — Progress Notes (Signed)
Subjective:    Patient ID: Michelle Walsh, female    DOB: 1955-09-22, 64 y.o.   MRN: [id number]  CC: Michelle Walsh is a 64 y.o. female who presents today for physical exam.    HPI: Complains of decreased hearing in left ear, years, thinks worsening.  Endorses left tinnitus.  NO ear discharge, sinus pressure, ha, vertigo, ear pain.   RLS - feels well on requip  Tremor- feels overmedicated and would like to come off the primidone and propranolol. On primidone 50mg  BID.  Feels occassional lightheadedness, no syncope.   Takes mobic every once and while for back 'spasm'. No numbness in legs, trouble urinating.   Depression and anxiety- doing well on celexa, buspar. No si/hi.    Osteoporosis - compliant with fosamax, takes with water, waiting 30 minutes.On ca and vit D.    Dr tat, neurology 08/30/19- essential tremor and dystonia- on propranolol and increased primidone  Colorectal Cancer Screening: due Breast Cancer Screening: Mammogram UTD Cervical Cancer Screening: UTD, follows with Dr Amalia Hailey Bone Health screening/DEXA for 65+: done 06/29/19 Lung Cancer Screening: Doesn't have 30 year pack year history and age > 40 years       Tetanus - due        Pneumococcal - Candidate for pneumovax after 11/17/19 Hepatitis C screening - Candidate for, consents  Labs: Screening labs today.  Exercise: Gets regular exercise.  Alcohol use: rare Smoking/tobacco use: Nonsmoker.    HISTORY:  Past Medical History:  Diagnosis Date  . Anxiety   . Chronic pain of right knee   . Depression   . Osteoarthritis   . Restless leg syndrome   . Tremors of nervous system   . Vitamin D deficiency     Past Surgical History:  Procedure Laterality Date  . CESAREAN SECTION    . teeth all removed     Family History  Problem Relation Age of Onset  . Breast cancer Maternal Grandmother 15  . Seizures Father       ALLERGIES: Ciprofloxacin and Codeine  Current Outpatient Medications on File Prior to  Visit  Medication Sig Dispense Refill  . alendronate (FOSAMAX) 70 MG tablet Take 1 tablet (70 mg total) by mouth every 7 (seven) days. Take with a full glass of water on an empty stomach. 4 tablet 11  . busPIRone (BUSPAR) 10 MG tablet TAKE 1 TABLET BY MOUTH TWICE A DAY 60 tablet 2  . Cholecalciferol (VITAMIN D3) 125 MCG (5000 UT) CAPS Take 125 mcg by mouth daily.    . citalopram (CELEXA) 20 MG tablet Take 1 tablet (20 mg total) by mouth daily. 90 tablet 1  . diclofenac sodium (VOLTAREN) 1 % GEL     . meloxicam (MOBIC) 7.5 MG tablet Take 1 tablet (7.5 mg total) by mouth daily as needed for pain. 30 tablet 2  . primidone (MYSOLINE) 50 MG tablet 2 in the AM, 1 q hs 270 tablet 1  . rOPINIRole (REQUIP) 1 MG tablet TAKE 1 TABLET (1 MG TOTAL) BY MOUTH AT BEDTIME. 90 tablet 0  . rosuvastatin (CRESTOR) 10 MG tablet Take 1 tablet (10 mg total) by mouth daily. 90 tablet 3   No current facility-administered medications on file prior to visit.    Social History   Tobacco Use  . Smoking status: Never Smoker  . Smokeless tobacco: Never Used  Vaping Use  . Vaping Use: Some days  Substance Use Topics  . Alcohol use: Never    Comment: very  rare - 1 time per year  . Drug use: Yes    Types: Marijuana    Comment: does it thru a vape - uses THC - "helps with anxiety"    Review of Systems  Constitutional: Negative for chills, fever and unexpected weight change.  HENT: Negative for congestion.   Respiratory: Negative for cough.   Cardiovascular: Negative for chest pain, palpitations and leg swelling.  Gastrointestinal: Negative for nausea and vomiting.  Musculoskeletal: Negative for arthralgias and myalgias.  Skin: Negative for rash.  Neurological: Positive for dizziness and tremors. Negative for syncope and headaches.  Hematological: Negative for adenopathy.  Psychiatric/Behavioral: Negative for confusion.      Objective:    BP 118/70   Pulse (!) 47   Temp 97.8 F (36.6 C)   Ht 5\' 5"  (1.651  m)   Wt 133 lb 12.8 oz (60.7 kg)   SpO2 98%   BMI 22.27 kg/m   BP Readings from Last 3 Encounters:  10/24/19 118/70  08/09/19 103/63  03/08/19 122/64   Wt Readings from Last 3 Encounters:  10/24/19 133 lb 12.8 oz (60.7 kg)  08/29/19 136 lb (61.7 kg)  08/09/19 135 lb 1.6 oz (61.3 kg)    Physical Exam Vitals reviewed.  Constitutional:      Appearance: She is well-developed.  Eyes:     Conjunctiva/sclera: Conjunctivae normal.  Neck:     Thyroid: No thyroid mass or thyromegaly.  Cardiovascular:     Rate and Rhythm: Normal rate and regular rhythm.     Pulses: Normal pulses.     Heart sounds: Normal heart sounds.  Pulmonary:     Effort: Pulmonary effort is normal.     Breath sounds: Normal breath sounds. No wheezing, rhonchi or rales.  Lymphadenopathy:     Head:     Right side of head: No submental, submandibular, tonsillar, preauricular, posterior auricular or occipital adenopathy.     Left side of head: No submental, submandibular, tonsillar, preauricular, posterior auricular or occipital adenopathy.     Cervical: No cervical adenopathy.  Skin:    General: Skin is warm and dry.  Neurological:     Mental Status: She is alert.  Psychiatric:        Speech: Speech normal.        Behavior: Behavior normal.        Thought Content: Thought content normal.        Assessment & Plan:   Problem List Items Addressed This Visit      Musculoskeletal and Integument   Osteopenia    Stable, continue fosamax        Other   Decreased hearing   Relevant Orders   Ambulatory referral to ENT   Depression with anxiety    Stable, continue regimen      Restless leg syndrome    Stable, continue requip      Routine physical examination - Primary    Declines CBE and pelvic exam as recently done by Dr Amalia Hailey. Colonoscopy ordered.       Relevant Orders   Ambulatory referral to Gastroenterology   TSH   VITAMIN D 25 Hydroxy (Vit-D Deficiency, Fractures)   Hepatitis C antibody    Tremor of left hand    Stable on regimen however concerned that propranolol is making her dizzy, bradycardic. Have decreased dose today. Close follow up.       Relevant Medications   propranolol (INDERAL) 40 MG tablet       I have discontinued  [name]. Eroh's propranolol. I am also having her start on propranolol. Additionally, I am having her maintain her meloxicam, diclofenac sodium, Vitamin D3, rosuvastatin, alendronate, primidone, busPIRone, citalopram, and rOPINIRole.   Meds ordered this encounter  Medications  . propranolol (INDERAL) 40 MG tablet    Sig: Take 1 tablet (40 mg total) by mouth 2 (two) times daily.    Dispense:  120 tablet    Refill:  1    Order Specific Question:   Supervising Provider    Answer:   [name] [[id number]]    Return precautions given.   Risks, benefits, and alternatives of the medications and treatment plan prescribed today were discussed, and patient expressed understanding.   Education regarding symptom management and diagnosis given to patient on AVS.   Continue to follow with [name], FNP for routine health maintenance.   [name] and I agreed with plan.   [name], FNP

## 2019-10-24 NOTE — Patient Instructions (Addendum)
Due to low heartrate and dizziness, decrease propranolol to 40mg  twice per day Let me know how you are feeling   colonoscopy ordered Let us know if you dont hear back within a week in regards to an appointment being scheduled.    Bradycardia, Adult Bradycardia is a slower-than-normal heartbeat. A normal resting heart rate for an adult ranges from 60 to 100 beats per minute. With bradycardia, the resting heart rate is less than 60 beats per minute. Bradycardia can prevent enough oxygen from reaching certain areas of your body when you are active. It can be serious if it keeps enough oxygen from reaching your brain and other parts of your body. Bradycardia is not a problem for everyone. For some healthy adults, a slow resting heart rate is normal. What are the causes? This condition may be caused by:  A problem with the heart, including: ? A problem with the heart's electrical system, such as a heart block. With a heart block, electrical signals between the chambers of the heart are partially or completely blocked, so they are not able to work as they should. ? A problem with the heart's natural pacemaker (sinus node). ? Heart disease. ? A heart attack. ? Heart damage. ? Lyme disease. ? A heart infection. ? A heart condition that is present at birth (congenital heart defect).  Certain medicines that treat heart conditions.  Certain conditions, such as hypothyroidism and obstructive sleep apnea.  Problems with the balance of chemicals and other substances, like potassium, in the blood.  Trauma.  Radiation therapy. What increases the risk? You are more likely to develop this condition if you:  Are age 43 or older.  Have high blood pressure (hypertension), high cholesterol (hyperlipidemia), or diabetes.  Drink heavily, use tobacco or nicotine products, or use drugs. What are the signs or symptoms? Symptoms of this condition include:  Light-headedness.  Feeling faint or  fainting.  Fatigue and weakness.  Trouble with activity or exercise.  Shortness of breath.  Chest pain (angina).  Drowsiness.  Confusion.  Dizziness. How is this diagnosed? This condition may be diagnosed based on:  Your symptoms.  Your medical history.  A physical exam. During the exam, your health care provider will listen to your heartbeat and check your pulse. To confirm the diagnosis, your health care provider may order tests, such as:  Blood tests.  An electrocardiogram (ECG). This test records the heart's electrical activity. The test can show how fast your heart is beating and whether the heartbeat is steady.  A test in which you wear a portable device (event recorder or Holter monitor) to record your heart's electrical activity while you go about your day.  Anexercise test. How is this treated? Treatment for this condition depends on the cause of the condition and how severe your symptoms are. Treatment may involve:  Treatment of the underlying condition.  Changing your medicines or how much medicine you take.  Having a small, battery-operated device called a pacemaker implanted under the skin. When bradycardia occurs, this device can be used to increase your heart rate and help your heart beat in a regular rhythm. Follow these instructions at home: Lifestyle   Manage any health conditions that contribute to bradycardia as told by your health care provider.  Follow a heart-healthy diet. A nutrition specialist (dietitian) can help educate you about healthy food options and changes.  Follow an exercise program that is approved by your health care provider.  Maintain a healthy weight.  Try  to reduce or manage your stress, such as with yoga or meditation. If you need help reducing stress, ask your health care provider.  Do not use any products that contain nicotine or tobacco, such as cigarettes, e-cigarettes, and chewing tobacco. If you need help quitting,  ask your health care provider.  Do not use illegal drugs.  Limit alcohol intake to no more than 1 drink a day for nonpregnant women and 2 drinks a day for men. Be aware of how much alcohol is in your drink. In the [place] one drink equals one 12 oz bottle of beer (355 mL), one 5 oz glass of wine (148 mL), or one 1 oz glass of hard liquor (44 mL). General instructions  Take over-the-counter and prescription medicines only as told by your health care provider.  Keep all follow-up visits as told by your health care provider. This is important. How is this prevented? In some cases, bradycardia may be prevented by:  Treating underlying medical problems.  Stopping behaviors or medicines that can trigger the condition. Contact a health care provider if you:  Feel light-headed or dizzy.  Almost faint.  Feel weak or are easily fatigued during physical activity.  Experience confusion or have memory problems. Get help right away if:  You faint.  You have: ? An irregular heartbeat (palpitations). ? Chest pain. ? Trouble breathing. Summary  Bradycardia is a slower-than-normal heartbeat. With bradycardia, the resting heart rate is less than 60 beats per minute.  Treatment for this condition depends on the cause.  Manage any health conditions that contribute to bradycardia as told by your health care provider.  Do not use any products that contain nicotine or tobacco, such as cigarettes, e-cigarettes, and chewing tobacco, and limit alcohol intake.  Keep all follow-up visits as told by your health care provider. This is important. This information is not intended to replace advice given to you by your health care provider. Make sure you discuss any questions you have with your health care provider. Document Revised: 10/25/2017 Document Reviewed: 09/22/2017 Elsevier Patient Education  [place] Maintenance for Postmenopausal Women Menopause is a normal process in  which your ability to get pregnant comes to an end. This process happens slowly over many months or years, usually between the ages of 23 and 40. Menopause is complete when you have missed your menstrual periods for 12 months. It is important to talk with your health care provider about some of the most common conditions that affect women after menopause (postmenopausal women). These include heart disease, cancer, and bone loss (osteoporosis). Adopting a healthy lifestyle and getting preventive care can help to promote your health and wellness. The actions you take can also lower your chances of developing some of these common conditions. What should I know about menopause? During menopause, you may get a number of symptoms, such as:  Hot flashes. These can be moderate or severe.  Night sweats.  Decrease in sex drive.  Mood swings.  Headaches.  Tiredness.  Irritability.  Memory problems.  Insomnia. Choosing to treat or not to treat these symptoms is a decision that you make with your health care provider. Do I need hormone replacement therapy?  Hormone replacement therapy is effective in treating symptoms that are caused by menopause, such as hot flashes and night sweats.  Hormone replacement carries certain risks, especially as you become older. If you are thinking about using estrogen or estrogen with progestin, discuss the benefits and risks with your  health care provider. What is my risk for heart disease and stroke? The risk of heart disease, heart attack, and stroke increases as you age. One of the causes may be a change in the body's hormones during menopause. This can affect how your body uses dietary fats, triglycerides, and cholesterol. Heart attack and stroke are medical emergencies. There are many things that you can do to help prevent heart disease and stroke. Watch your blood pressure  High blood pressure causes heart disease and increases the risk of stroke. This is  more likely to develop in people who have high blood pressure readings, are of African descent, or are overweight.  Have your blood pressure checked: ? Every 3-5 years if you are 85-78 years of age. ? Every year if you are 52 years old or older. Eat a healthy diet   Eat a diet that includes plenty of vegetables, fruits, low-fat dairy products, and lean protein.  Do not eat a lot of foods that are high in solid fats, added sugars, or sodium. Get regular exercise Get regular exercise. This is one of the most important things you can do for your health. Most adults should:  Try to exercise for at least 150 minutes each week. The exercise should increase your heart rate and make you sweat (moderate-intensity exercise).  Try to do strengthening exercises at least twice each week. Do these in addition to the moderate-intensity exercise.  Spend less time sitting. Even light physical activity can be beneficial. Other tips  Work with your health care provider to achieve or maintain a healthy weight.  Do not use any products that contain nicotine or tobacco, such as cigarettes, e-cigarettes, and chewing tobacco. If you need help quitting, ask your health care provider.  Know your numbers. Ask your health care provider to check your cholesterol and your blood sugar (glucose). Continue to have your blood tested as directed by your health care provider. Do I need screening for cancer? Depending on your health history and family history, you may need to have cancer screening at different stages of your life. This may include screening for:  Breast cancer.  Cervical cancer.  Lung cancer.  Colorectal cancer. What is my risk for osteoporosis? After menopause, you may be at increased risk for osteoporosis. Osteoporosis is a condition in which bone destruction happens more quickly than new bone creation. To help prevent osteoporosis or the bone fractures that can happen because of osteoporosis, you  may take the following actions:  If you are 84-66 years old, get at least 1,000 mg of calcium and at least 600 mg of vitamin D per day.  If you are older than age 33 but younger than age 15, get at least 1,200 mg of calcium and at least 600 mg of vitamin D per day.  If you are older than age 49, get at least 1,200 mg of calcium and at least 800 mg of vitamin D per day. Smoking and drinking excessive alcohol increase the risk of osteoporosis. Eat foods that are rich in calcium and vitamin D, and do weight-bearing exercises several times each week as directed by your health care provider. How does menopause affect my mental health? Depression may occur at any age, but it is more common as you become older. Common symptoms of depression include:  Low or sad mood.  Changes in sleep patterns.  Changes in appetite or eating patterns.  Feeling an overall lack of motivation or enjoyment of activities that you previously  enjoyed.  Frequent crying spells. Talk with your health care provider if you think that you are experiencing depression. General instructions See your health care provider for regular wellness exams and vaccines. This may include:  Scheduling regular health, dental, and eye exams.  Getting and maintaining your vaccines. These include: ? Influenza vaccine. Get this vaccine each year before the flu season begins. ? Pneumonia vaccine. ? Shingles vaccine. ? Tetanus, diphtheria, and pertussis (Tdap) booster vaccine. Your health care provider may also recommend other immunizations. Tell your health care provider if you have ever been abused or do not feel safe at home. Summary  Menopause is a normal process in which your ability to get pregnant comes to an end.  This condition causes hot flashes, night sweats, decreased interest in sex, mood swings, headaches, or lack of sleep.  Treatment for this condition may include hormone replacement therapy.  Take actions to keep  yourself healthy, including exercising regularly, eating a healthy diet, watching your weight, and checking your blood pressure and blood sugar levels.  Get screened for cancer and depression. Make sure that you are up to date with all your vaccines. This information is not intended to replace advice given to you by your health care provider. Make sure you discuss any questions you have with your health care provider. Document Revised: 04/06/2018 Document Reviewed: 04/06/2018 Elsevier Patient Education  2020 [employer].

## 2019-10-24 NOTE — Assessment & Plan Note (Signed)
Stable, continue regimen. 

## 2019-10-24 NOTE — Assessment & Plan Note (Signed)
Stable, continue fosamax

## 2019-10-24 NOTE — Assessment & Plan Note (Signed)
Stable on regimen however concerned that propranolol is making her dizzy, bradycardic. Have decreased dose today. Close follow up.

## 2019-10-24 NOTE — Assessment & Plan Note (Signed)
Stable, continue requip

## 2019-10-25 ENCOUNTER — Other Ambulatory Visit: Payer: Self-pay | Admitting: Family

## 2019-10-25 LAB — HEPATITIS C ANTIBODY
Hepatitis C Ab: NONREACTIVE
SIGNAL TO CUT-OFF: 0.01 (ref <1.00)

## 2019-10-27 NOTE — Progress Notes (Signed)
Patient verbalized understanding and had no further questions.  

## 2019-10-31 DIAGNOSIS — H903 Sensorineural hearing loss, bilateral: Secondary | ICD-10-CM | POA: Diagnosis not present

## 2019-10-31 DIAGNOSIS — J301 Allergic rhinitis due to pollen: Secondary | ICD-10-CM | POA: Diagnosis not present

## 2019-10-31 DIAGNOSIS — H90A32 Mixed conductive and sensorineural hearing loss, unilateral, left ear with restricted hearing on the contralateral side: Secondary | ICD-10-CM | POA: Diagnosis not present

## 2019-11-14 ENCOUNTER — Other Ambulatory Visit: Payer: Self-pay

## 2019-11-14 ENCOUNTER — Telehealth (INDEPENDENT_AMBULATORY_CARE_PROVIDER_SITE_OTHER): Payer: Self-pay | Admitting: Gastroenterology

## 2019-11-14 DIAGNOSIS — Z1211 Encounter for screening for malignant neoplasm of colon: Secondary | ICD-10-CM

## 2019-11-14 MED ORDER — NA SULFATE-K SULFATE-MG SULF 17.5-3.13-1.6 GM/177ML PO SOLN: 1.0000 | Freq: Once | ORAL | 0 refills | Status: AC

## 2019-11-14 NOTE — Progress Notes (Signed)
Gastroenterology Pre-Procedure Review  Request Date: Tuesday 12/05/19 Requesting Physician: Dr. Marius Ditch  PATIENT REVIEW QUESTIONS: The patient responded to the following health history questions as indicated:    1. Are you having any GI issues? no 2. Do you have a personal history of Polyps? unsure thinks maybe 1 polyp years ago was done at [facility] did not see record of this in chart.  referral stated screening 3. Do you have a family history of Colon Cancer or Polyps? no 4. Diabetes Mellitus? no 5. Joint replacements in the past 12 months?no 6. Major health problems in the past 3 months?no 7. Any artificial heart valves, MVP, or defibrillator?no    MEDICATIONS & ALLERGIES:    Patient reports the following regarding taking any anticoagulation/antiplatelet therapy:   Plavix, Coumadin, Eliquis, Xarelto, Lovenox, Pradaxa, Brilinta, or Effient? no Aspirin? no  Patient confirms/reports the following medications:  Current Outpatient Medications  Medication Sig Dispense Refill  . alendronate (FOSAMAX) 70 MG tablet Take 1 tablet (70 mg total) by mouth every 7 (seven) days. Take with a full glass of water on an empty stomach. 4 tablet 11  . busPIRone (BUSPAR) 10 MG tablet TAKE 1 TABLET BY MOUTH TWICE A DAY 60 tablet 2  . citalopram (CELEXA) 20 MG tablet Take 1 tablet (20 mg total) by mouth daily. 90 tablet 1  . diclofenac sodium (VOLTAREN) 1 % GEL     . meloxicam (MOBIC) 7.5 MG tablet Take 1 tablet (7.5 mg total) by mouth daily as needed for pain. 30 tablet 2  . primidone (MYSOLINE) 50 MG tablet 2 in the AM, 1 q hs 270 tablet 1  . propranolol (INDERAL) 40 MG tablet Take 1 tablet (40 mg total) by mouth 2 (two) times daily. 120 tablet 1  . rOPINIRole (REQUIP) 1 MG tablet TAKE 1 TABLET (1 MG TOTAL) BY MOUTH AT BEDTIME. 90 tablet 0  . rosuvastatin (CRESTOR) 10 MG tablet Take 1 tablet (10 mg total) by mouth daily. 90 tablet 3  . fluticasone (FLONASE) 50 MCG/ACT nasal spray Place 2 sprays into both  nostrils daily.    . Na Sulfate-K Sulfate-Mg Sulf 17.5-3.13-1.6 GM/177ML SOLN Take 1 kit by mouth once for 1 dose. 354 mL 0   No current facility-administered medications for this visit.    Patient confirms/reports the following allergies:  Allergies  Allergen Reactions  . Ciprofloxacin     Sick with C.Diff for 2 months after taking medication  . Codeine Nausea And Vomiting    No orders of the defined types were placed in this encounter.   AUTHORIZATION INFORMATION Primary Insurance: 1D#: Group #:  Secondary Insurance: 1D#: Group #:  SCHEDULE INFORMATION: Date: Tuesday 12/05/19 Time: Location:[facility]

## 2019-11-15 ENCOUNTER — Ambulatory Visit (INDEPENDENT_AMBULATORY_CARE_PROVIDER_SITE_OTHER): Payer: PPO

## 2019-11-15 VITALS — Ht 65.0 in | Wt 133.0 lb

## 2019-11-15 DIAGNOSIS — Z0000 Encounter for general adult medical examination without abnormal findings: Secondary | ICD-10-CM

## 2019-11-15 NOTE — Patient Instructions (Addendum)
Michelle Walsh , Thank you for taking time to come for your Medicare Wellness Visit. I appreciate your ongoing commitment to your health goals. Please review the following plan we discussed and let me know if I can assist you in the future.   These are the goals we discussed: Goals      Patient Stated   .  Maintain Healthy Lifestyle (pt-stated)      Stay hydrated Stay active Healthy diet       This is a list of the screening recommended for you and due dates:  Health Maintenance  Topic Date Due  . Tetanus Vaccine  Never done  . Colon Cancer Screening  Never done  . Pneumonia vaccines (2 of 2 - PPSV23) 11/17/2019  . Flu Shot  11/26/2019  . Mammogram  06/28/2021  . DEXA scan (bone density measurement)  Completed  . COVID-19 Vaccine  Completed  .  Hepatitis C: One time screening is recommended by Center for Disease Control  (CDC) for  adults born from [birth year] through 1967.   Completed    Immunizations Immunization History  Administered Date(s) Administered  . Influenza, High Dose Seasonal PF 03/02/2019  . Moderna SARS-COVID-2 Vaccination 07/17/2019, 08/14/2019  . Pneumococcal Conjugate-13 11/17/2018   Keep all routine maintenance appointments.   Follow up 12/25/19 @ 11:00  Advanced directives: declined  Conditions/risks identified: none new  Follow up in one year for your annual wellness visit    Preventive Care 65 Years and Older, Female Preventive care refers to lifestyle choices and visits with your health care provider that can promote health and wellness. What does preventive care include?  A yearly physical exam. This is also called an annual well check.  Dental exams once or twice a year.  Routine eye exams. Ask your health care provider how often you should have your eyes checked.  Personal lifestyle choices, including:  Daily care of your teeth and gums.  Regular physical activity.  Eating a healthy diet.  Avoiding tobacco and drug use.  Limiting  alcohol use.  Practicing safe sex.  Taking low-dose aspirin every day.  Taking vitamin and mineral supplements as recommended by your health care provider. What happens during an annual well check? The services and screenings done by your health care provider during your annual well check will depend on your age, overall health, lifestyle risk factors, and family history of disease. Counseling  Your health care provider may ask you questions about your:  Alcohol use.  Tobacco use.  Drug use.  Emotional well-being.  Home and relationship well-being.  Sexual activity.  Eating habits.  History of falls.  Memory and ability to understand (cognition).  Work and work Statistician.  Reproductive health. Screening  You may have the following tests or measurements:  Height, weight, and BMI.  Blood pressure.  Lipid and cholesterol levels. These may be checked every 5 years, or more frequently if you are over 27 years old.  Skin check.  Lung cancer screening. You may have this screening every year starting at age 63 if you have a 30-pack-year history of smoking and currently smoke or have quit within the past 15 years.  Fecal occult blood test (FOBT) of the stool. You may have this test every year starting at age 29.  Flexible sigmoidoscopy or colonoscopy. You may have a sigmoidoscopy every 5 years or a colonoscopy every 10 years starting at age 24.  Hepatitis C blood test.  Hepatitis B blood test.  Sexually transmitted disease (  STD) testing.  Diabetes screening. This is done by checking your blood sugar (glucose) after you have not eaten for a while (fasting). You may have this done every 1-3 years.  Bone density scan. This is done to screen for osteoporosis. You may have this done starting at age 50.  Mammogram. This may be done every 1-2 years. Talk to your health care provider about how often you should have regular mammograms. Talk with your health care provider  about your test results, treatment options, and if necessary, the need for more tests. Vaccines  Your health care provider may recommend certain vaccines, such as:  Influenza vaccine. This is recommended every year.  Tetanus, diphtheria, and acellular pertussis (Tdap, Td) vaccine. You may need a Td booster every 10 years.  Zoster vaccine. You may need this after age 110.  Pneumococcal 13-valent conjugate (PCV13) vaccine. One dose is recommended after age 86.  Pneumococcal polysaccharide (PPSV23) vaccine. One dose is recommended after age 17. Talk to your health care provider about which screenings and vaccines you need and how often you need them. This information is not intended to replace advice given to you by your health care provider. Make sure you discuss any questions you have with your health care provider. Document Released: 05/10/2015 Document Revised: 01/01/2016 Document Reviewed: 02/12/2015 Elsevier Interactive Patient Education  2017 [place] Prevention in the Home Falls can cause injuries. They can happen to people of all ages. There are many things you can do to make your home safe and to help prevent falls. What can I do on the outside of my home?  Regularly fix the edges of walkways and driveways and fix any cracks.  Remove anything that might make you trip as you walk through a door, such as a raised step or threshold.  Trim any bushes or trees on the path to your home.  Use bright outdoor lighting.  Clear any walking paths of anything that might make someone trip, such as rocks or tools.  Regularly check to see if handrails are loose or broken. Make sure that both sides of any steps have handrails.  Any raised decks and porches should have guardrails on the edges.  Have any leaves, snow, or ice cleared regularly.  Use sand or salt on walking paths during winter.  Clean up any spills in your garage right away. This includes oil or grease  spills. What can I do in the bathroom?  Use night lights.  Install grab bars by the toilet and in the tub and shower. Do not use towel bars as grab bars.  Use non-skid mats or decals in the tub or shower.  If you need to sit down in the shower, use a plastic, non-slip stool.  Keep the floor dry. Clean up any water that spills on the floor as soon as it happens.  Remove soap buildup in the tub or shower regularly.  Attach bath mats securely with double-sided non-slip rug tape.  Do not have throw rugs and other things on the floor that can make you trip. What can I do in the bedroom?  Use night lights.  Make sure that you have a light by your bed that is easy to reach.  Do not use any sheets or blankets that are too big for your bed. They should not hang down onto the floor.  Have a firm chair that has side arms. You can use this for support while you get dressed.  Do  not have throw rugs and other things on the floor that can make you trip. What can I do in the kitchen?  Clean up any spills right away.  Avoid walking on wet floors.  Keep items that you use a lot in easy-to-reach places.  If you need to reach something above you, use a strong step stool that has a grab bar.  Keep electrical cords out of the way.  Do not use floor polish or wax that makes floors slippery. If you must use wax, use non-skid floor wax.  Do not have throw rugs and other things on the floor that can make you trip. What can I do with my stairs?  Do not leave any items on the stairs.  Make sure that there are handrails on both sides of the stairs and use them. Fix handrails that are broken or loose. Make sure that handrails are as long as the stairways.  Check any carpeting to make sure that it is firmly attached to the stairs. Fix any carpet that is loose or worn.  Avoid having throw rugs at the top or bottom of the stairs. If you do have throw rugs, attach them to the floor with carpet  tape.  Make sure that you have a light switch at the top of the stairs and the bottom of the stairs. If you do not have them, ask someone to add them for you. What else can I do to help prevent falls?  Wear shoes that:  Do not have high heels.  Have rubber bottoms.  Are comfortable and fit you well.  Are closed at the toe. Do not wear sandals.  If you use a stepladder:  Make sure that it is fully opened. Do not climb a closed stepladder.  Make sure that both sides of the stepladder are locked into place.  Ask someone to hold it for you, if possible.  Clearly mark and make sure that you can see:  Any grab bars or handrails.  First and last steps.  Where the edge of each step is.  Use tools that help you move around (mobility aids) if they are needed. These include:  Canes.  Walkers.  Scooters.  Crutches.  Turn on the lights when you go into a dark area. Replace any light bulbs as soon as they burn out.  Set up your furniture so you have a clear path. Avoid moving your furniture around.  If any of your floors are uneven, fix them.  If there are any pets around you, be aware of where they are.  Review your medicines with your doctor. Some medicines can make you feel dizzy. This can increase your chance of falling. Ask your doctor what other things that you can do to help prevent falls. This information is not intended to replace advice given to you by your health care provider. Make sure you discuss any questions you have with your health care provider. Document Released: 02/07/2009 Document Revised: 09/19/2015 Document Reviewed: 05/18/2014 Elsevier Interactive Patient Education  2017 [employer].

## 2019-11-15 NOTE — Progress Notes (Addendum)
Subjective:   Michelle Walsh is a 64 y.o. female who presents for an Initial Medicare Annual Wellness Visit.  Review of Systems    No ROS.  Medicare Wellness Virtual Visit.     Cardiac Risk Factors include: advanced age (>73men, >85 women)     Objective:    Today's Vitals   11/15/19 1533  Weight: 133 lb (60.3 kg)  Height: 5\' 5"  (1.651 m)   Body mass index is 22.13 kg/m.  Advanced Directives 11/15/2019 08/29/2019 02/17/2019  Does Patient Have a Medical Advance Directive? No No No  Would patient like information on creating a medical advance directive? No - Patient declined - -    Current Medications (verified) Outpatient Encounter Medications as of 11/15/2019  Medication Sig  . alendronate (FOSAMAX) 70 MG tablet Take 1 tablet (70 mg total) by mouth every 7 (seven) days. Take with a full glass of water on an empty stomach.  . busPIRone (BUSPAR) 10 MG tablet TAKE 1 TABLET BY MOUTH TWICE A DAY  . citalopram (CELEXA) 20 MG tablet Take 1 tablet (20 mg total) by mouth daily.  . diclofenac sodium (VOLTAREN) 1 % GEL   . fluticasone (FLONASE) 50 MCG/ACT nasal spray Place 2 sprays into both nostrils daily.  . meloxicam (MOBIC) 7.5 MG tablet Take 1 tablet (7.5 mg total) by mouth daily as needed for pain.  . primidone (MYSOLINE) 50 MG tablet 2 in the AM, 1 q hs  . propranolol (INDERAL) 40 MG tablet Take 1 tablet (40 mg total) by mouth 2 (two) times daily.  Marland Kitchen rOPINIRole (REQUIP) 1 MG tablet TAKE 1 TABLET (1 MG TOTAL) BY MOUTH AT BEDTIME.  . rosuvastatin (CRESTOR) 10 MG tablet Take 1 tablet (10 mg total) by mouth daily.   No facility-administered encounter medications on file as of 11/15/2019.    Allergies (verified) Ciprofloxacin and Codeine   History: Past Medical History:  Diagnosis Date  . Anxiety   . Chronic pain of right knee   . Depression   . Osteoarthritis   . Restless leg syndrome   . Tremors of nervous system   . Vitamin D deficiency    Past Surgical History:    Procedure Laterality Date  . CESAREAN SECTION    . teeth all removed     Family History  Problem Relation Age of Onset  . Breast cancer Maternal Grandmother 36  . Seizures Father    Social History   Socioeconomic History  . Marital status: Married    Spouse name: Not on file  . Number of children: 2  . Years of education: 76  . Highest education level: Not on file  Occupational History  . Occupation: retired  Tobacco Use  . Smoking status: Never Smoker  . Smokeless tobacco: Never Used  Vaping Use  . Vaping Use: Some days  Substance and Sexual Activity  . Alcohol use: Never    Comment: very rare - 1 time per year  . Drug use: Yes    Types: Marijuana    Comment: does it thru a vape - uses THC - "helps with anxiety"  . Sexual activity: Not on file  Other Topics Concern  . Not on file  Social History Narrative   2 children; one child lives with her and her 4 children   Right handed   One story home   Social Determinants of Health   Financial Resource Strain: Low Risk   . Difficulty of Paying Living Expenses: Not hard at  all  Food Insecurity: No Food Insecurity  . Worried About Charity fundraiser in the Last Year: Never true  . Ran Out of Food in the Last Year: Never true  Transportation Needs: No Transportation Needs  . Lack of Transportation (Medical): No  . Lack of Transportation (Non-Medical): No  Physical Activity:   . Days of Exercise per Week:   . Minutes of Exercise per Session:   Stress: No Stress Concern Present  . Feeling of Stress : Not at all  Social Connections: Unknown  . Frequency of Communication with Friends and Family: Not on file  . Frequency of Social Gatherings with Friends and Family: More than three times a week  . Attends Religious Services: Not on file  . Active Member of Clubs or Organizations: Not on file  . Attends Archivist Meetings: Not on file  . Marital Status: Not on file    Tobacco Counseling Counseling given:  Not Answered   Clinical Intake:  Pre-visit preparation completed: Yes        Diabetes: No  How often do you need to have someone help you when you read instructions, pamphlets, or other written materials from your doctor or pharmacy?: 1 - Never  Interpreter Needed?: No      Activities of Daily Living In your present state of health, do you have any difficulty performing the following activities: 11/15/2019 03/08/2019  Hearing? N N  Vision? N N  Difficulty concentrating or making decisions? N N  Walking or climbing stairs? N N  Dressing or bathing? N N  Doing errands, shopping? N N  Preparing Food and eating ? N -  Using the Toilet? N -  In the past six months, have you accidently leaked urine? N -  Do you have problems with loss of bowel control? N -  Managing your Medications? N -  Managing your Finances? N -  Housekeeping or managing your Housekeeping? N -  Some recent data might be hidden    Patient Care Team: [name], FNP as PCP - General (Family Medicine) Rico Junker, RN as Registered Nurse Theodore Demark, RN as Registered Nurse Tat, Eustace Quail, DO as Consulting Physician (Neurology)  Indicate any recent Medical Services you may have received from other than [facility] providers in the past year (date may be approximate).     Assessment:   This is a routine wellness examination for Michelle Walsh.  I connected with Michelle Walsh today by telephone and verified that I am speaking with the correct person using two identifiers. Location patient: home Location provider: work Persons participating in the virtual visit: patient, [name] scientist.    I discussed the limitations, risks, security and privacy concerns of performing an evaluation and management service by telephone and the availability of in person appointments. The patient expressed understanding and verbally consented to this telephonic visit.    Interactive audio and video telecommunications were attempted between  this provider and patient, however failed, due to patient having technical difficulties OR patient did not have access to video capability.  We continued and completed visit with audio only.  Some vital signs may be absent or patient reported.   Hearing/Vision screen  Hearing Screening   125Hz  250Hz  500Hz  1000Hz  2000Hz  3000Hz  4000Hz  6000Hz  8000Hz   Right ear:           Left ear:           Comments: Patient has difficulty hearing high pitch tones.  Followed by Selena Lesser  ENT.  Hearing aids not worn  Vision Screening Comments: Followed by Suzie Portela Wears corrective lenses Visual acuity not assessed, virtual visit.  They have seen their ophthalmologist in the last 12 months.     Dietary issues and exercise activities discussed: Current Exercise Habits: Home exercise routine, Time (Minutes): 20, Frequency (Times/Week): 4, Weekly Exercise (Minutes/Week): 80, Intensity: Mild  Healthy diet Good water intake  Goals      Patient Stated   .  Maintain Healthy Lifestyle (pt-stated)      Stay hydrated Stay active Healthy diet      Depression Screen PHQ 2/9 Scores 11/15/2019 10/24/2019 10/24/2019 07/24/2019 04/19/2019 12/21/2018  PHQ - 2 Score 0 0 0 0 0 0  PHQ- 9 Score 0 2 - 1 1 -    Fall Risk Fall Risk  11/15/2019 08/29/2019 04/19/2019 02/17/2019 12/21/2018  Falls in the past year? 0 1 0 0 0  Number falls in past yr: 0 0 - 0 0  Injury with Fall? - 0 - 0 -  Follow up Falls evaluation completed - Falls evaluation completed - Falls evaluation completed   Handrails in use when climbing stairs? Yes  Home free of loose throw rugs in walkways, pet beds, electrical cords, etc? Yes  Adequate lighting in your home to reduce risk of falls? Yes   TIMED UP AND GO: Was the test performed? No . Virtual visit.   Cognitive Function:  Patient is alert and oriented x3.     6CIT Screen 11/15/2019  What Year? 0 points  What month? 0 points  Months in reverse 0 points  Repeat phrase 0 points    Immunizations Immunization History  Administered Date(s) Administered  . Influenza, High Dose Seasonal PF 03/02/2019  . Moderna SARS-COVID-2 Vaccination 07/17/2019, 08/14/2019  . Pneumococcal Conjugate-13 11/17/2018    Health Maintenance Health Maintenance  Topic Date Due  . TETANUS/TDAP  Never done  . COLONOSCOPY  Never done  . PNA vac Low Risk Adult (2 of 2 - PPSV23) 11/17/2019  . INFLUENZA VACCINE  11/26/2019  . MAMMOGRAM  06/28/2021  . DEXA SCAN  Completed  . COVID-19 Vaccine  Completed  . Hepatitis C Screening  Completed   Tdap- Due, Education has been provided regarding the importance of this vaccine. Advised may receive this vaccine at local pharmacy or Health Dept. Aware to provide a copy of the vaccination record if obtained from local pharmacy or Health Dept. Verbalized acceptance and understanding.  Shingrix- Education has been provided regarding the importance of this vaccine. Advised may want to contact her insurance company to review cost. Understands she can receive this vaccine at local pharmacy or Health Dept. Aware to provide a copy of the vaccination record if obtained from local pharmacy or Health Dept. Verbalized acceptance and understanding.  Colonoscopy- scheduled per patient.  Dental Screening: Recommended annual dental exams for proper oral hygiene  Community Resource Referral / Chronic Care Management: CRR required this visit?  No   CCM required this visit?  No      Plan:   Keep all routine maintenance appointments.   Follow up 12/25/19 @ 11:00  I have personally reviewed and noted the following in the patient's chart:   . Medical and social history . Use of alcohol, tobacco or illicit drugs  . Current medications and supplements . Functional ability and status . Nutritional status . Physical activity . Advanced directives . List of other physicians . Hospitalizations, surgeries, and ER visits in previous 12 months . Vitals .  Screenings  to include cognitive, depression, and falls . Referrals and appointments  In addition, I have reviewed and discussed with patient certain preventive protocols, quality metrics, and best practice recommendations. A written personalized care plan for preventive services as well as general preventive health recommendations were provided to patient via mychart.     Varney Biles, LPN   9/84/2103    Agree with plan. [name], NP

## 2019-11-21 ENCOUNTER — Telehealth: Payer: Self-pay

## 2019-11-21 NOTE — Telephone Encounter (Signed)
Patient is calling to cancel colonoscopy because she can not afford the colonoscopy at this time. She states she will call back to rescheduled when she pay off some bills. Called ENDO and talk to Jocelyn Lamer and she states she will cancel the procedure.

## 2019-12-05 ENCOUNTER — Ambulatory Visit: Admit: 2019-12-05 | Payer: PPO | Admitting: Gastroenterology

## 2019-12-05 SURGERY — COLONOSCOPY WITH PROPOFOL
Anesthesia: General

## 2019-12-11 ENCOUNTER — Other Ambulatory Visit: Payer: Self-pay | Admitting: Family

## 2019-12-11 DIAGNOSIS — F418 Other specified anxiety disorders: Secondary | ICD-10-CM

## 2019-12-14 ENCOUNTER — Telehealth: Payer: Self-pay | Admitting: Neurology

## 2019-12-14 NOTE — Telephone Encounter (Signed)
Spoke with patient and she states she thinks its unnecessary to take and wants to stop the medicine. Plus patient states this mediation doesn't do what she thought it would do. Is this ok? If so patient wants to know how to wean down and stop the medication

## 2019-12-14 NOTE — Telephone Encounter (Signed)
Patient called in stating she currently takes Primidone and would like to stop taking it. She would like advice on how she should stop?

## 2019-12-14 NOTE — Telephone Encounter (Signed)
Is she referring to the primidone or propranolol?  If primidone, I think she is on 2 in the AM and 1 at night.  If that is correct, she can drop 1 pill per week until she is off (so 1 po bid x 1 week, the 1 po q day x 1 week and then d/c).  Also, if she doesn't want meds or botox then she can cancel her October f/u and let us know if she needs [place] in the future.

## 2019-12-15 NOTE — Telephone Encounter (Signed)
Spoke with patient and gave her weaning own instructions. She wrote it down and voiced understanding. Asked patient if she wanted to be on Botox or any other medication and she stated "No".   Follow up appointment in October has been canceled ; patient does not feel like the appt is necessary.

## 2019-12-25 ENCOUNTER — Ambulatory Visit (INDEPENDENT_AMBULATORY_CARE_PROVIDER_SITE_OTHER): Payer: PPO | Admitting: Family

## 2019-12-25 ENCOUNTER — Encounter: Payer: Self-pay | Admitting: Family

## 2019-12-25 ENCOUNTER — Other Ambulatory Visit: Payer: Self-pay

## 2019-12-25 VITALS — BP 130/68 | HR 55 | Temp 98.2°F | Ht 65.0 in | Wt 131.6 lb

## 2019-12-25 DIAGNOSIS — R001 Bradycardia, unspecified: Secondary | ICD-10-CM | POA: Diagnosis not present

## 2019-12-25 DIAGNOSIS — Z23 Encounter for immunization: Secondary | ICD-10-CM | POA: Diagnosis not present

## 2019-12-25 DIAGNOSIS — R251 Tremor, unspecified: Secondary | ICD-10-CM

## 2019-12-25 DIAGNOSIS — E785 Hyperlipidemia, unspecified: Secondary | ICD-10-CM | POA: Diagnosis not present

## 2019-12-25 MED ORDER — ROSUVASTATIN CALCIUM 10 MG PO TABS: 10.0000 mg | ORAL_TABLET | Freq: Every day | ORAL | 3 refills | Status: DC

## 2019-12-25 MED ORDER — PROPRANOLOL HCL 10 MG PO TABS: 10.0000 mg | ORAL_TABLET | Freq: Two times a day (BID) | ORAL | 0 refills | Status: DC

## 2019-12-25 NOTE — Assessment & Plan Note (Signed)
Chronic, poor control. Patient will continue primidone 50mg  BID as she felt 100mg  had been too strong. She will follow with Dr Tat, will follow.

## 2019-12-25 NOTE — Assessment & Plan Note (Signed)
Compliant with crestor, continue

## 2019-12-25 NOTE — Assessment & Plan Note (Addendum)
Asymptomatic. Wean off and stop propranolol. EKG shows sinus bradycardia. No acute ischemia. No prior EKG to compare too.  Close follow up and patient will monitor HR and call not normalized off medication.

## 2019-12-25 NOTE — Progress Notes (Signed)
Subjective:    Patient ID: Michelle Walsh, female    DOB: December 14, 1955, 64 y.o.   MRN: [id number]  CC: [name] is a 64 y.o. female who presents today for follow up.   HPI: Bradycardia- she is now on 40mg  propranolol 40mg  ( from 60mg  bid). Denies exertional chest pain or pressure, numbness or tingling radiating to left arm or jaw, palpitations, dizziness, frequent headaches, changes in vision, or shortness of breath.    Osteopenia - tolerating fosamax well.   Tremor- worse when she weaned off primidone. She has resumed primidone and would like to stay on primidone 50mg  bid.  HLD- compliant with crestor     5/5 Dr Tat- increased primidone 50mg  1 tablets in the morning and 1 tablet at night.  Surgical intervention consideration Continue propranolol 60mg  bid  HISTORY:  Past Medical History:  Diagnosis Date  . Anxiety   . Chronic pain of right knee   . Depression   . Osteoarthritis   . Restless leg syndrome   . Tremors of nervous system   . Vitamin D deficiency    Past Surgical History:  Procedure Laterality Date  . CESAREAN SECTION    . teeth all removed     Family History  Problem Relation Age of Onset  . Breast cancer Maternal Grandmother 52  . Seizures Father     Allergies: Ciprofloxacin and Codeine Current Outpatient Medications on File Prior to Visit  Medication Sig Dispense Refill  . alendronate (FOSAMAX) 70 MG tablet Take 1 tablet (70 mg total) by mouth every 7 (seven) days. Take with a full glass of water on an empty stomach. 4 tablet 11  . busPIRone (BUSPAR) 10 MG tablet TAKE 1 TABLET BY MOUTH TWICE A DAY 60 tablet 2  . citalopram (CELEXA) 20 MG tablet Take 1 tablet (20 mg total) by mouth daily. 90 tablet 1  . diclofenac sodium (VOLTAREN) 1 % GEL     . fluticasone (FLONASE) 50 MCG/ACT nasal spray Place 2 sprays into both nostrils daily.    . meloxicam (MOBIC) 7.5 MG tablet Take 1 tablet (7.5 mg total) by mouth daily as needed for pain. 30 tablet 2  .  primidone (MYSOLINE) 50 MG tablet 2 in the AM, 1 q hs 270 tablet 1  . rOPINIRole (REQUIP) 1 MG tablet TAKE 1 TABLET (1 MG TOTAL) BY MOUTH AT BEDTIME. 90 tablet 0   No current facility-administered medications on file prior to visit.    Social History   Tobacco Use  . Smoking status: Never Smoker  . Smokeless tobacco: Never Used  Vaping Use  . Vaping Use: Some days  Substance Use Topics  . Alcohol use: Never    Comment: very rare - 1 time per year  . Drug use: Yes    Types: Marijuana    Comment: does it thru a vape - uses THC - "helps with anxiety"    Review of Systems  Constitutional: Negative for chills and fever.  Respiratory: Negative for cough and shortness of breath.   Cardiovascular: Negative for chest pain and palpitations.  Gastrointestinal: Negative for nausea and vomiting.  Neurological: Positive for tremors. Negative for dizziness.      Objective:    BP 130/68   Pulse (!) 55   Temp 98.2 F (36.8 C)   Ht 5\' 5"  (1.651 m)   Wt 131 lb 9.6 oz (59.7 kg)   SpO2 98%   BMI 21.90 kg/m  BP Readings from Last 3  Encounters:  12/25/19 130/68  10/24/19 118/70  08/09/19 103/63   Wt Readings from Last 3 Encounters:  12/25/19 131 lb 9.6 oz (59.7 kg)  11/15/19 133 lb (60.3 kg)  10/24/19 133 lb 12.8 oz (60.7 kg)    Physical Exam Vitals reviewed.  Constitutional:      Appearance: She is well-developed.  Eyes:     Conjunctiva/sclera: Conjunctivae normal.  Cardiovascular:     Rate and Rhythm: Regular rhythm. Bradycardia present.     Pulses: Normal pulses.     Heart sounds: Normal heart sounds.  Pulmonary:     Effort: Pulmonary effort is normal.     Breath sounds: Normal breath sounds. No wheezing, rhonchi or rales.  Skin:    General: Skin is warm and dry.  Neurological:     Mental Status: She is alert.     Comments: tremor noted bilateral hands, more prominent when patient gestured with  Hands.   Psychiatric:        Speech: Speech normal.        Behavior:  Behavior normal.        Thought Content: Thought content normal.        Assessment & Plan:   Problem List Items Addressed This Visit      Other   Bradycardia - Primary    Asymptomatic. Wean off and stop propranolol. EKG shows sinus bradycardia. No acute ischemia. No prior EKG to compare too.  Close follow up and patient will monitor HR and call not normalized off medication.        Relevant Medications   propranolol (INDERAL) 10 MG tablet   Other Relevant Orders   EKG 12-Lead   HLD (hyperlipidemia)    Compliant with crestor, continue      Relevant Medications   propranolol (INDERAL) 10 MG tablet   Tremor of left hand    Chronic, poor control. Patient will continue primidone 50mg  BID as she felt 100mg  had been too strong. She will follow with Dr Tat, will follow.          I have discontinued [name]. Moscato's propranolol. I am also having her start on propranolol. Additionally, I am having her maintain her meloxicam, diclofenac sodium, alendronate, primidone, citalopram, rOPINIRole, fluticasone, and busPIRone.   Meds ordered this encounter  Medications  . propranolol (INDERAL) 10 MG tablet    Sig: Take 1 tablet (10 mg total) by mouth in the morning and at bedtime.    Dispense:  30 tablet    Refill:  0    Order Specific Question:   Supervising Provider    Answer:   [name] [[id number]]    Return precautions given.   Risks, benefits, and alternatives of the medications and treatment plan prescribed today were discussed, and patient expressed understanding.   Education regarding symptom management and diagnosis given to patient on AVS.  Continue to follow with [name], FNP for routine health maintenance.   [name] and I agreed with plan.   [name], FNP

## 2019-12-25 NOTE — Patient Instructions (Addendum)
Wean off and stop propranolol  Start 20 mg twice per day for 4 days, then take propranolol 10mg  twice per day for 4 days and the stop.   Normal heart rate is 60-99 bpm; let me know after tap where you heart rate is.   Let me know of any chest pain,shortness of breath, dizziness.     Bradycardia, Adult Bradycardia is a slower-than-normal heartbeat. A normal resting heart rate for an adult ranges from 60 to 100 beats per minute. With bradycardia, the resting heart rate is less than 60 beats per minute. Bradycardia can prevent enough oxygen from reaching certain areas of your body when you are active. It can be serious if it keeps enough oxygen from reaching your brain and other parts of your body. Bradycardia is not a problem for everyone. For some healthy adults, a slow resting heart rate is normal. What are the causes? This condition may be caused by:  A problem with the heart, including: ? A problem with the heart's electrical system, such as a heart block. With a heart block, electrical signals between the chambers of the heart are partially or completely blocked, so they are not able to work as they should. ? A problem with the heart's natural pacemaker (sinus node). ? Heart disease. ? A heart attack. ? Heart damage. ? Lyme disease. ? A heart infection. ? A heart condition that is present at birth (congenital heart defect).  Certain medicines that treat heart conditions.  Certain conditions, such as hypothyroidism and obstructive sleep apnea.  Problems with the balance of chemicals and other substances, like potassium, in the blood.  Trauma.  Radiation therapy. What increases the risk? You are more likely to develop this condition if you:  Are age 48 or older.  Have high blood pressure (hypertension), high cholesterol (hyperlipidemia), or diabetes.  Drink heavily, use tobacco or nicotine products, or use drugs. What are the signs or symptoms? Symptoms of this condition  include:  Light-headedness.  Feeling faint or fainting.  Fatigue and weakness.  Trouble with activity or exercise.  Shortness of breath.  Chest pain (angina).  Drowsiness.  Confusion.  Dizziness. How is this diagnosed? This condition may be diagnosed based on:  Your symptoms.  Your medical history.  A physical exam. During the exam, your health care provider will listen to your heartbeat and check your pulse. To confirm the diagnosis, your health care provider may order tests, such as:  Blood tests.  An electrocardiogram (ECG). This test records the heart's electrical activity. The test can show how fast your heart is beating and whether the heartbeat is steady.  A test in which you wear a portable device (event recorder or Holter monitor) to record your heart's electrical activity while you go about your day.  Anexercise test. How is this treated? Treatment for this condition depends on the cause of the condition and how severe your symptoms are. Treatment may involve:  Treatment of the underlying condition.  Changing your medicines or how much medicine you take.  Having a small, battery-operated device called a pacemaker implanted under the skin. When bradycardia occurs, this device can be used to increase your heart rate and help your heart beat in a regular rhythm. Follow these instructions at home: Lifestyle   Manage any health conditions that contribute to bradycardia as told by your health care provider.  Follow a heart-healthy diet. A nutrition specialist (dietitian) can help educate you about healthy food options and changes.  Follow an  exercise program that is approved by your health care provider.  Maintain a healthy weight.  Try to reduce or manage your stress, such as with yoga or meditation. If you need help reducing stress, ask your health care provider.  Do not use any products that contain nicotine or tobacco, such as cigarettes,  e-cigarettes, and chewing tobacco. If you need help quitting, ask your health care provider.  Do not use illegal drugs.  Limit alcohol intake to no more than 1 drink a day for nonpregnant women and 2 drinks a day for men. Be aware of how much alcohol is in your drink. In the [place] one drink equals one 12 oz bottle of beer (355 mL), one 5 oz glass of wine (148 mL), or one 1 oz glass of hard liquor (44 mL). General instructions  Take over-the-counter and prescription medicines only as told by your health care provider.  Keep all follow-up visits as told by your health care provider. This is important. How is this prevented? In some cases, bradycardia may be prevented by:  Treating underlying medical problems.  Stopping behaviors or medicines that can trigger the condition. Contact a health care provider if you:  Feel light-headed or dizzy.  Almost faint.  Feel weak or are easily fatigued during physical activity.  Experience confusion or have memory problems. Get help right away if:  You faint.  You have: ? An irregular heartbeat (palpitations). ? Chest pain. ? Trouble breathing. Summary  Bradycardia is a slower-than-normal heartbeat. With bradycardia, the resting heart rate is less than 60 beats per minute.  Treatment for this condition depends on the cause.  Manage any health conditions that contribute to bradycardia as told by your health care provider.  Do not use any products that contain nicotine or tobacco, such as cigarettes, e-cigarettes, and chewing tobacco, and limit alcohol intake.  Keep all follow-up visits as told by your health care provider. This is important. This information is not intended to replace advice given to you by your health care provider. Make sure you discuss any questions you have with your health care provider. Document Revised: 10/25/2017 Document Reviewed: 09/22/2017 Elsevier Patient Education  [employer].

## 2019-12-25 NOTE — Addendum Note (Signed)
Addended by: Dorna Leitz on: 12/25/2019 03:59 PM   Modules accepted: Orders

## 2020-01-03 ENCOUNTER — Telehealth: Payer: Self-pay | Admitting: Family

## 2020-01-12 NOTE — Telephone Encounter (Signed)
err

## 2020-01-15 ENCOUNTER — Other Ambulatory Visit: Payer: Self-pay | Admitting: Family

## 2020-01-15 DIAGNOSIS — R251 Tremor, unspecified: Secondary | ICD-10-CM

## 2020-01-16 ENCOUNTER — Ambulatory Visit: Payer: PPO | Admitting: Gastroenterology

## 2020-01-18 ENCOUNTER — Other Ambulatory Visit: Payer: Self-pay | Admitting: Family

## 2020-01-18 DIAGNOSIS — R001 Bradycardia, unspecified: Secondary | ICD-10-CM

## 2020-02-02 ENCOUNTER — Ambulatory Visit: Payer: PPO | Admitting: Family

## 2020-02-12 ENCOUNTER — Ambulatory Visit: Payer: PPO | Admitting: Neurology

## 2020-02-13 ENCOUNTER — Ambulatory Visit (INDEPENDENT_AMBULATORY_CARE_PROVIDER_SITE_OTHER): Payer: PPO | Admitting: Family

## 2020-02-13 ENCOUNTER — Other Ambulatory Visit: Payer: Self-pay

## 2020-02-13 ENCOUNTER — Encounter: Payer: Self-pay | Admitting: Family

## 2020-02-13 VITALS — BP 120/62 | HR 58 | Temp 98.1°F | Ht 65.0 in | Wt 134.4 lb

## 2020-02-13 DIAGNOSIS — Z23 Encounter for immunization: Secondary | ICD-10-CM | POA: Diagnosis not present

## 2020-02-13 DIAGNOSIS — R251 Tremor, unspecified: Secondary | ICD-10-CM | POA: Diagnosis not present

## 2020-02-13 DIAGNOSIS — R001 Bradycardia, unspecified: Secondary | ICD-10-CM

## 2020-02-13 NOTE — Assessment & Plan Note (Signed)
Stable . Continue primidone 50mg  bid

## 2020-02-13 NOTE — Patient Instructions (Signed)
Please ensure you re-schedule your colonoscopy  Referral to cardiology  Let us know if you dont hear back within a week in regards to an appointment being scheduled.

## 2020-02-13 NOTE — Progress Notes (Signed)
Subjective:    Patient ID: Michelle Walsh, female    DOB: 06-05-1955, 64 y.o.   MRN: [id number]  CC: Michelle Walsh is a 64 y.o. female who presents today for follow up bradycardia, tremor.   HPI: Feels well today. No complaints. No dizziness, cp, fatigue.  She is off of the propranolol and hasnt noticed increase in tremor.   Tremor- primidone 50mg  bid which works well. Tremor is not particularly bothersome for her and she doesn't feel interfere's with quality of life.  Colonoscopy due- had had scheduled and canceled.   HISTORY:  Past Medical History:  Diagnosis Date  . Anxiety   . Chronic pain of right knee   . Depression   . Osteoarthritis   . Restless leg syndrome   . Tremors of nervous system   . Vitamin D deficiency    Past Surgical History:  Procedure Laterality Date  . CESAREAN SECTION    . teeth all removed     Family History  Problem Relation Age of Onset  . Breast cancer Maternal Grandmother 41  . Seizures Father     Allergies: Ciprofloxacin and Codeine Current Outpatient Medications on File Prior to Visit  Medication Sig Dispense Refill  . alendronate (FOSAMAX) 70 MG tablet Take 1 tablet (70 mg total) by mouth every 7 (seven) days. Take with a full glass of water on an empty stomach. 4 tablet 11  . busPIRone (BUSPAR) 10 MG tablet TAKE 1 TABLET BY MOUTH TWICE A DAY 60 tablet 2  . citalopram (CELEXA) 20 MG tablet Take 1 tablet (20 mg total) by mouth daily. 90 tablet 1  . diclofenac sodium (VOLTAREN) 1 % GEL     . fluticasone (FLONASE) 50 MCG/ACT nasal spray Place 2 sprays into both nostrils daily.    . meloxicam (MOBIC) 7.5 MG tablet Take 1 tablet (7.5 mg total) by mouth daily as needed for pain. 30 tablet 2  . primidone (MYSOLINE) 50 MG tablet 2 in the AM, 1 q hs 270 tablet 1  . rOPINIRole (REQUIP) 1 MG tablet TAKE 1 TABLET (1 MG TOTAL) BY MOUTH AT BEDTIME. 90 tablet 0  . rosuvastatin (CRESTOR) 10 MG tablet Take 1 tablet (10 mg total) by mouth daily. 90 tablet 3     No current facility-administered medications on file prior to visit.    Social History   Tobacco Use  . Smoking status: Never Smoker  . Smokeless tobacco: Never Used  Vaping Use  . Vaping Use: Some days  Substance Use Topics  . Alcohol use: Never    Comment: very rare - 1 time per year  . Drug use: Yes    Types: Marijuana    Comment: does it thru a vape - uses THC - "helps with anxiety"    Review of Systems  Constitutional: Negative for chills and fever.  Respiratory: Negative for cough.   Cardiovascular: Negative for chest pain and palpitations.  Gastrointestinal: Negative for nausea and vomiting.  Neurological: Negative for dizziness.      Objective:    BP 120/62   Pulse (!) 58   Temp 98.1 F (36.7 C)   Ht 5\' 5"  (1.651 m)   Wt 134 lb 6.4 oz (61 kg)   SpO2 98%   BMI 22.37 kg/m  BP Readings from Last 3 Encounters:  02/13/20 120/62  12/25/19 130/68  10/24/19 118/70   Wt Readings from Last 3 Encounters:  02/13/20 134 lb 6.4 oz (61 kg)  12/25/19 131 lb  9.6 oz (59.7 kg)  11/15/19 133 lb (60.3 kg)    Physical Exam Vitals reviewed.  Constitutional:      Appearance: She is well-developed.  Eyes:     Conjunctiva/sclera: Conjunctivae normal.  Cardiovascular:     Rate and Rhythm: Normal rate and regular rhythm.     Pulses: Normal pulses.     Heart sounds: Normal heart sounds.  Pulmonary:     Effort: Pulmonary effort is normal.     Breath sounds: Normal breath sounds. No wheezing, rhonchi or rales.  Skin:    General: Skin is warm and dry.  Neurological:     Mental Status: She is alert.     Comments: Tremor bilateral hands while resting on legs. Tremor of head appreciated as well.   Psychiatric:        Speech: Speech normal.        Behavior: Behavior normal.        Thought Content: Thought content normal.        Assessment & Plan:   Problem List Items Addressed This Visit      Other   Bradycardia - Primary    Pleased as remains asymptomatic.  Prior HR 55. Today HR range 58 - 67 off the propranolol. She is euthyroid. Advised cardiology referral to ensure that no further evaluation warranted and bradycardia felt to be benign. Patient in agreement and referral placed.        Relevant Orders   Ambulatory referral to Cardiology   Tremor of left hand    Stable . Continue primidone 50mg  bid       Other Visit Diagnoses    Need for immunization against influenza       Relevant Orders   Flu Vaccine QUAD High Dose(Fluad) (Completed)       I have discontinued Michelle Walsh's propranolol. I am also having her maintain her meloxicam, diclofenac sodium, alendronate, primidone, citalopram, rOPINIRole, fluticasone, busPIRone, and rosuvastatin.   No orders of the defined types were placed in this encounter.   Return precautions given.   Risks, benefits, and alternatives of the medications and treatment plan prescribed today were discussed, and patient expressed understanding.   Education regarding symptom management and diagnosis given to patient on AVS.  Continue to follow with Michelle Hawthorne, FNP for routine health maintenance.   Michelle Walsh and I agreed with plan.   Michelle Paris, FNP

## 2020-02-13 NOTE — Assessment & Plan Note (Addendum)
Pleased as remains asymptomatic. Prior HR 55. Today HR range 58 - 67 off the propranolol. She is euthyroid. Advised cardiology referral to ensure that no further evaluation warranted and bradycardia felt to be benign. Patient in agreement and referral placed.

## 2020-02-21 ENCOUNTER — Other Ambulatory Visit: Payer: Self-pay | Admitting: Family Medicine

## 2020-02-22 ENCOUNTER — Other Ambulatory Visit: Payer: Self-pay | Admitting: Neurology

## 2020-02-23 ENCOUNTER — Telehealth: Payer: Self-pay | Admitting: Family

## 2020-02-23 NOTE — Telephone Encounter (Signed)
Rejection Reason - Patient did not respond" [place] at [facility] said on Feb 23, 2020 11:51 AM

## 2020-02-26 NOTE — Telephone Encounter (Signed)
See prior note Didn't appear to go through

## 2020-02-26 NOTE — Telephone Encounter (Signed)
Call pt Appears cardiology has tried to reach her Did she get a message or has declined consult?  I advise that has cardiology consult due to low heartrate

## 2020-02-27 NOTE — Telephone Encounter (Signed)
Michelle Walsh please see prior note; epic was freezing when trying to route to you

## 2020-02-29 NOTE — Telephone Encounter (Signed)
Pt set up appointment 11/24 @ 11.

## 2020-03-05 ENCOUNTER — Other Ambulatory Visit: Payer: Self-pay | Admitting: Family

## 2020-03-05 DIAGNOSIS — F418 Other specified anxiety disorders: Secondary | ICD-10-CM

## 2020-03-08 ENCOUNTER — Other Ambulatory Visit: Payer: Self-pay | Admitting: Family

## 2020-03-08 DIAGNOSIS — F418 Other specified anxiety disorders: Secondary | ICD-10-CM

## 2020-03-20 ENCOUNTER — Encounter: Payer: Self-pay | Admitting: Cardiology

## 2020-03-20 ENCOUNTER — Other Ambulatory Visit: Payer: Self-pay

## 2020-03-20 ENCOUNTER — Ambulatory Visit: Payer: PPO | Admitting: Cardiology

## 2020-03-20 VITALS — BP 118/62 | HR 69 | Ht 65.0 in | Wt 133.0 lb

## 2020-03-20 DIAGNOSIS — R251 Tremor, unspecified: Secondary | ICD-10-CM | POA: Diagnosis not present

## 2020-03-20 DIAGNOSIS — R001 Bradycardia, unspecified: Secondary | ICD-10-CM

## 2020-03-20 NOTE — Progress Notes (Signed)
Electrophysiology Office Note:    Date:  03/20/2020   ID:  Michelle Walsh, Michelle Walsh 13-Jun-1955, MRN [id number]  PCP:  [name], [facility] Cardiologist:  No primary care provider on file.  [facility] Electrophysiologist:  None   Referring MD: [name], FNP   Chief Complaint: Bradycardia  History of Present Illness:    Michelle Walsh is a 64 y.o. female who presents for an evaluation of bradycardia at the request of [name], [place]. Their medical history includes tremors, restless leg syndrome, depression, anxiety.  She has a tremor that involves mostly her left hand.  For this she has previously taken propranolol but this was stopped and changed to primidone given low heart rates.  Off of the propranolol, her heart rates ranged from 58-67 as of a recent office visit on February 13, 2020.  At that visit she was asymptomatic. She tells me that she had very poor tremor control while on propranolol.  Her tremor is much better controlled on primidone.  She thinks she has had a low heart rate for decades.  She is reviewed some of her previous medical records and sees that her heart rates are frequently in the 40s or 50s beats per minute.  She is never had a syncopal episode.  She is very active with her husband.  They take care of 4 grandchildren their home without any limitations.  Past Medical History:  Diagnosis Date  . Anxiety   . Chronic pain of right knee   . Depression   . Osteoarthritis   . Restless leg syndrome   . Tremors of nervous system   . Vitamin D deficiency     Past Surgical History:  Procedure Laterality Date  . CESAREAN SECTION    . teeth all removed      Current Medications: Current Meds  Medication Sig  . alendronate (FOSAMAX) 70 MG tablet Take 1 tablet (70 mg total) by mouth every 7 (seven) days. Take with a full glass of water on an empty stomach.  . busPIRone (BUSPAR) 10 MG tablet TAKE 1 TABLET BY MOUTH TWICE A DAY  . citalopram  (CELEXA) 20 MG tablet TAKE 1 TABLET BY MOUTH EVERY DAY  . diclofenac sodium (VOLTAREN) 1 % GEL as needed.   . fluticasone (FLONASE) 50 MCG/ACT nasal spray Place 2 sprays into both nostrils daily.  . meloxicam (MOBIC) 7.5 MG tablet Take 1 tablet (7.5 mg total) by mouth daily as needed for pain.  . primidone (MYSOLINE) 50 MG tablet 2 in the AM, 1 q hs  . rOPINIRole (REQUIP) 1 MG tablet TAKE 1 TABLET (1 MG TOTAL) BY MOUTH AT BEDTIME.  . rosuvastatin (CRESTOR) 10 MG tablet Take 1 tablet (10 mg total) by mouth daily.     Allergies:   Ciprofloxacin and Codeine   Social History   Socioeconomic History  . Marital status: Married    Spouse name: Not on file  . Number of children: 2  . Years of education: 70  . Highest education level: Not on file  Occupational History  . Occupation: retired  Tobacco Use  . Smoking status: Never Smoker  . Smokeless tobacco: Never Used  Vaping Use  . Vaping Use: Some days  Substance and Sexual Activity  . Alcohol use: Never    Comment: very rare - 1 time per year  . Drug use: Yes    Types: Marijuana    Comment: does it thru a vape - uses  THC - "helps with anxiety"  . Sexual activity: Not on file  Other Topics Concern  . Not on file  Social History Narrative   2 children; one child lives with her and her 4 children   Right handed   One story home   Social Determinants of Health   Financial Resource Strain: Low Risk   . Difficulty of Paying Living Expenses: Not hard at all  Food Insecurity: No Food Insecurity  . Worried About Charity fundraiser in the Last Year: Never true  . Ran Out of Food in the Last Year: Never true  Transportation Needs: No Transportation Needs  . Lack of Transportation (Medical): No  . Lack of Transportation (Non-Medical): No  Physical Activity:   . Days of Exercise per Week: Not on file  . Minutes of Exercise per Session: Not on file  Stress: No Stress Concern Present  . Feeling of Stress : Not at all  Social  Connections: Unknown  . Frequency of Communication with Friends and Family: Not on file  . Frequency of Social Gatherings with Friends and Family: More than three times a week  . Attends Religious Services: Not on file  . Active Member of Clubs or Organizations: Not on file  . Attends Archivist Meetings: Not on file  . Marital Status: Not on file     Family History: The patient's family history includes Breast cancer (age of onset: 84) in her maternal grandmother; Seizures in her father.  ROS:   Please see the history of present illness.    All other systems reviewed and are negative.  EKGs/Labs/Other Studies Reviewed:    The following studies were reviewed today: Prior notes  December 25, 2019 EKG personally reviewed shows sinus bradycardia with a heart rate of approximately 50 bpm.  EKG:  The ekg ordered today demonstrates normal sinus rhythm at 69 bpm.  Normal intervals.  No evidence of AV nodal disease.  Recent Labs: 07/18/2019: ALT 13 09/28/2019: BUN 13; Creatinine, Ser 0.92; Potassium 3.7; Sodium 139 10/24/2019: TSH 2.04  Recent Lipid Panel    Component Value Date/Time   CHOL 109 07/18/2019 0959   TRIG 58.0 07/18/2019 0959   HDL 37.00 (L) 07/18/2019 0959   CHOLHDL 3 07/18/2019 0959   VLDL 11.6 07/18/2019 0959   LDLCALC 61 07/18/2019 0959    Physical Exam:    VS:  BP 118/62   Pulse 69   Ht 5\' 5"  (1.651 m)   Wt 133 lb (60.3 kg)   SpO2 98%   BMI 22.13 kg/m     Wt Readings from Last 3 Encounters:  03/20/20 133 lb (60.3 kg)  02/13/20 134 lb 6.4 oz (61 kg)  12/25/19 131 lb 9.6 oz (59.7 kg)     GEN:  Well nourished, well developed in no acute distress HEENT: Normal NECK: No JVD; No carotid bruits LYMPHATICS: No lymphadenopathy CARDIAC: RRR, no murmurs, rubs, gallops RESPIRATORY:  Clear to auscultation without rales, wheezing or rhonchi  ABDOMEN: Soft, non-tender, non-distended MUSCULOSKELETAL:  No edema; No deformity  SKIN: Warm and dry NEUROLOGIC:   Alert and oriented x 3.  Tremor in her head, neck and bilateral upper extremities.  Left upper extremity worse than right upper extremity. PSYCHIATRIC:  Normal affect   ASSESSMENT:    1. Sinus bradycardia   2. Tremor of left hand    PLAN:    In order of problems listed above:  1. Sinus bradycardia Asymptomatic.  Her EKG shows normal AV  conduction.  I suspect this is her normal resting heart rate which has been present since childhood.  No further cardiovascular work-up indicated.  2.  Tremors On primidone.  Previously on propranolol but this lower the heart rate even more.  Tremors are better controlled on primidone.    Medication Adjustments/Labs and Tests Ordered: Current medicines are reviewed at length with the patient today.  Concerns regarding medicines are outlined above.  No orders of the defined types were placed in this encounter.  No orders of the defined types were placed in this encounter.    Signed, Lars Mage, MD, [facility]  03/20/2020 11:35 AM    Electrophysiology [facility]

## 2020-04-30 ENCOUNTER — Telehealth: Payer: Self-pay

## 2020-04-30 NOTE — Telephone Encounter (Signed)
Pt would like PCP to refill primidone (MYSOLINE) 50 MG tablet. Originally prescribed by someone else but wants PCP to manage

## 2020-04-30 NOTE — Telephone Encounter (Signed)
Are you willing to fill for patient? Originally was filled by Lurena Joiner Tat.

## 2020-05-02 MED ORDER — PRIMIDONE 50 MG PO TABS: 50.0000 mg | ORAL_TABLET | Freq: Two times a day (BID) | ORAL | 1 refills | Status: DC

## 2020-05-02 NOTE — Telephone Encounter (Signed)
Call pt [name].  Please confirm accurate and ensure she has f/u scheduled in next couple of months

## 2020-06-06 ENCOUNTER — Other Ambulatory Visit: Payer: Self-pay | Admitting: Family

## 2020-06-06 DIAGNOSIS — F418 Other specified anxiety disorders: Secondary | ICD-10-CM

## 2020-06-08 ENCOUNTER — Other Ambulatory Visit: Payer: Self-pay | Admitting: Family Medicine

## 2020-06-23 ENCOUNTER — Other Ambulatory Visit: Payer: Self-pay | Admitting: Family

## 2020-06-23 DIAGNOSIS — M8580 Other specified disorders of bone density and structure, unspecified site: Secondary | ICD-10-CM

## 2020-06-29 ENCOUNTER — Other Ambulatory Visit: Payer: Self-pay | Admitting: Family

## 2020-06-29 DIAGNOSIS — F418 Other specified anxiety disorders: Secondary | ICD-10-CM

## 2020-07-25 ENCOUNTER — Other Ambulatory Visit: Payer: Self-pay | Admitting: Family

## 2020-08-14 ENCOUNTER — Other Ambulatory Visit: Payer: Self-pay

## 2020-08-14 ENCOUNTER — Encounter: Payer: Self-pay | Admitting: Family

## 2020-08-14 ENCOUNTER — Ambulatory Visit (INDEPENDENT_AMBULATORY_CARE_PROVIDER_SITE_OTHER): Payer: PPO

## 2020-08-14 ENCOUNTER — Ambulatory Visit (INDEPENDENT_AMBULATORY_CARE_PROVIDER_SITE_OTHER): Payer: PPO | Admitting: Family

## 2020-08-14 VITALS — BP 102/60 | HR 68 | Temp 97.0°F | Ht 65.0 in | Wt 132.6 lb

## 2020-08-14 DIAGNOSIS — M159 Polyosteoarthritis, unspecified: Secondary | ICD-10-CM | POA: Diagnosis not present

## 2020-08-14 DIAGNOSIS — F418 Other specified anxiety disorders: Secondary | ICD-10-CM | POA: Diagnosis not present

## 2020-08-14 DIAGNOSIS — M8580 Other specified disorders of bone density and structure, unspecified site: Secondary | ICD-10-CM

## 2020-08-14 DIAGNOSIS — M25512 Pain in left shoulder: Secondary | ICD-10-CM

## 2020-08-14 DIAGNOSIS — M12812 Other specific arthropathies, not elsewhere classified, left shoulder: Secondary | ICD-10-CM | POA: Diagnosis not present

## 2020-08-14 DIAGNOSIS — I700 Atherosclerosis of aorta: Secondary | ICD-10-CM | POA: Diagnosis not present

## 2020-08-14 DIAGNOSIS — M19012 Primary osteoarthritis, left shoulder: Secondary | ICD-10-CM | POA: Diagnosis not present

## 2020-08-14 MED ORDER — DULOXETINE HCL 30 MG PO CPEP: 30.0000 mg | ORAL_CAPSULE | Freq: Every day | ORAL | 3 refills | Status: DC

## 2020-08-14 MED ORDER — MELOXICAM 7.5 MG PO TABS: 7.5000 mg | ORAL_TABLET | Freq: Every day | ORAL | 2 refills | Status: DC | PRN

## 2020-08-14 MED ORDER — ROPINIROLE HCL 1 MG PO TABS: 1.0000 mg | ORAL_TABLET | Freq: Every day | ORAL | 0 refills | Status: DC

## 2020-08-14 MED ORDER — CITALOPRAM HYDROBROMIDE 20 MG PO TABS: 20.0000 mg | ORAL_TABLET | Freq: Every day | ORAL | 1 refills | Status: DC

## 2020-08-14 NOTE — Assessment & Plan Note (Addendum)
Uncontrolled. Recent exacerbation in setting of family stress. In setting of left shoulder pain, we also opted to discontinue citalopram and start cymbalta. She will continue buspar 10mg  bid.

## 2020-08-14 NOTE — Progress Notes (Signed)
Subjective:    Patient ID: Michelle Walsh, female    DOB: 03-20-1956, 65 y.o.   MRN: [id number]  CC: Michelle Walsh is a 65 y.o. female who presents today for follow up.   HPI: Left anterior shoulder pain x 2 weeks, worsened. She noticed pain one morning when she woke up unprovoked.  She has taken tylenol for left shoulder pain with relief.  No icing. Pain with reaching forward or reaching behind her.  She sleeps with left arm flexed under pillow. No pain with raising left arm. Left handed.  No erythema, numbness, cp, rash, edema, fever, injury or known injury, neck pain.  She has run out of mobic 7.5mg .  Osteopenia- compliant with fosamax. No trouble swallowing.   Depression and anxiety-recent concerns with family dynamics as has been stressed with this. Compliant with celexa 20mg  and buspar 10mg  BID with relief. No si/hi.     QTc 439 7/22   HISTORY:  Past Medical History:  Diagnosis Date  . Anxiety   . Chronic pain of right knee   . Depression   . Osteoarthritis   . Restless leg syndrome   . Tremors of nervous system   . Vitamin D deficiency    Past Surgical History:  Procedure Laterality Date  . CESAREAN SECTION    . teeth all removed     Family History  Problem Relation Age of Onset  . Breast cancer Maternal Grandmother 79  . Seizures Father     Allergies: Ciprofloxacin and Codeine Current Outpatient Medications on File Prior to Visit  Medication Sig Dispense Refill  . alendronate (FOSAMAX) 70 MG tablet TAKE 1 TABLET BY MOUTH EVERY 7 (SEVEN) DAYS. TAKE WITH A FULL GLASS OF WATER ON AN EMPTY STOMACH. 12 tablet 3  . busPIRone (BUSPAR) 10 MG tablet TAKE 1 TABLET BY MOUTH TWICE A DAY 180 tablet 1  . diclofenac sodium (VOLTAREN) 1 % GEL as needed.     . fluticasone (FLONASE) 50 MCG/ACT nasal spray Place 2 sprays into both nostrils daily.    . primidone (MYSOLINE) 50 MG tablet TAKE 1 TABLET (50 MG TOTAL) BY MOUTH IN THE MORNING AND AT BEDTIME. 180 tablet 1  .  rosuvastatin (CRESTOR) 10 MG tablet Take 1 tablet (10 mg total) by mouth daily. 90 tablet 3   No current facility-administered medications on file prior to visit.    Social History   Tobacco Use  . Smoking status: Never Smoker  . Smokeless tobacco: Never Used  Vaping Use  . Vaping Use: Some days  Substance Use Topics  . Alcohol use: Never    Comment: very rare - 1 time per year  . Drug use: Yes    Types: Marijuana    Comment: does it thru a vape - uses THC - "helps with anxiety"    Review of Systems  Constitutional: Negative for chills and fever.  Respiratory: Negative for cough.   Cardiovascular: Negative for chest pain and palpitations.  Gastrointestinal: Negative for nausea and vomiting.  Musculoskeletal: Positive for arthralgias (left shoulder).  Neurological: Negative for numbness and headaches.  Psychiatric/Behavioral: Negative for sleep disturbance and suicidal ideas. The patient is nervous/anxious.       Objective:    BP 102/60   Pulse 68   Temp (!) 97 F (36.1 C)   Ht 5\' 5"  (1.651 m)   Wt 132 lb 9.6 oz (60.1 kg)   SpO2 97%   BMI 22.07 kg/m  BP Readings from Last 3  Encounters:  08/14/20 102/60  03/20/20 118/62  02/13/20 120/62   Wt Readings from Last 3 Encounters:  08/14/20 132 lb 9.6 oz (60.1 kg)  03/20/20 133 lb (60.3 kg)  02/13/20 134 lb 6.4 oz (61 kg)    Physical Exam Vitals reviewed.  Constitutional:      Appearance: She is well-developed.  Eyes:     Conjunctiva/sclera: Conjunctivae normal.  Cardiovascular:     Rate and Rhythm: Normal rate and regular rhythm.     Pulses: Normal pulses.     Heart sounds: Normal heart sounds.  Pulmonary:     Effort: Pulmonary effort is normal.     Breath sounds: Normal breath sounds. No wheezing, rhonchi or rales.  Musculoskeletal:     Right shoulder: Normal. No swelling.     Left shoulder: Bony tenderness present. Decreased range of motion.     Comments: Left Shoulder:   Left shoulder joint more  prominent than right  pain with palpation over  AC joint.  pain with internal and external rotation. No pain with resisted lateral extension .   Negative active painful arc sign. Negative passive arc ( Neer's). Negative drop arm.  No pain, swelling, or ecchymosis noted over long head of biceps.   Strength and sensation normal BUE's. Bilateral fine tremor observed when hands extended.    Skin:    General: Skin is warm and dry.  Neurological:     Mental Status: She is alert.  Psychiatric:        Speech: Speech normal.        Behavior: Behavior normal.        Thought Content: Thought content normal.        Assessment & Plan:   Problem List Items Addressed This Visit      Musculoskeletal and Integument   Osteoarthritis of multiple joints   Relevant Medications   meloxicam (MOBIC) 7.5 MG tablet   Osteopenia    Compliant with fosamax 70mg , DEXA UTD. Continue.         Other   Depression with anxiety - Primary    Uncontrolled. Recent exacerbation in setting of family stress. In setting of left shoulder pain, we also opted to discontinue citalopram and start cymbalta. She will continue buspar 10mg  bid.       Relevant Medications   DULoxetine (CYMBALTA) 30 MG capsule   Other Relevant Orders   Ambulatory referral to Psychology   Left shoulder pain    Worsening. Exam consistent with AC joint dysfunction. Pending XR. She will use mobic 7.5mg  prn. If no improvement, consider  orthopedic referral. Close follow up.      Relevant Orders   DG Shoulder Left       I have discontinued [name]. [place]'s citalopram. I am also having her start on DULoxetine. Additionally, I am having her maintain her diclofenac sodium, fluticasone, rosuvastatin, alendronate, busPIRone, primidone, and meloxicam.   Meds ordered this encounter  Medications  . meloxicam (MOBIC) 7.5 MG tablet    Sig: Take 1 tablet (7.5 mg total) by mouth daily as needed for pain.    Dispense:  30 tablet    Refill:  2     Order Specific Question:   Supervising Provider    Answer:   Deborra Medina L [[id number]]  . DULoxetine (CYMBALTA) 30 MG capsule    Sig: Take 1 capsule (30 mg total) by mouth daily.    Dispense:  90 capsule    Refill:  3    Order Specific  Question:   Supervising Provider    Answer:   [name] [[id number]]    Return precautions given.   Risks, benefits, and alternatives of the medications and treatment plan prescribed today were discussed, and patient expressed understanding.   Education regarding symptom management and diagnosis given to patient on AVS.  Continue to follow with [name], FNP for routine health maintenance.   [name] and I agreed with plan.   [name], FNP

## 2020-08-14 NOTE — Assessment & Plan Note (Signed)
Worsening. Exam consistent with AC joint dysfunction. Pending XR. She will use mobic 7.5mg  prn. If no improvement, consider  orthopedic referral. Close follow up.

## 2020-08-14 NOTE — Patient Instructions (Signed)
STOP citalopram  Start cymbalta  Let me know how you are doing

## 2020-08-14 NOTE — Assessment & Plan Note (Signed)
Compliant with fosamax 70mg , DEXA UTD. Continue.

## 2020-09-02 ENCOUNTER — Ambulatory Visit (INDEPENDENT_AMBULATORY_CARE_PROVIDER_SITE_OTHER): Payer: PPO | Admitting: Psychologist

## 2020-09-02 DIAGNOSIS — F4322 Adjustment disorder with anxiety: Secondary | ICD-10-CM | POA: Diagnosis not present

## 2020-09-09 ENCOUNTER — Ambulatory Visit (INDEPENDENT_AMBULATORY_CARE_PROVIDER_SITE_OTHER): Payer: PPO | Admitting: Psychologist

## 2020-09-09 DIAGNOSIS — F4322 Adjustment disorder with anxiety: Secondary | ICD-10-CM | POA: Diagnosis not present

## 2020-10-30 ENCOUNTER — Telehealth: Payer: Self-pay | Admitting: Family

## 2020-10-30 NOTE — Telephone Encounter (Signed)
Just FYI I called patient & she does see you next week. She said that she felt like starting the Cymbalta she had more constipation issues. I told her since you were out of office that it was safe to take a daily stool softener & she could try the one cap full a day of Miralax to see if any relief. She said that she has dealt with constipation off & on in the past just worsened as of late. If no relief from either I advised the vegetable based laxative Senakot, but to make this last resort. Pt will discuss with you when she comes in 7/12.

## 2020-10-30 NOTE — Telephone Encounter (Signed)
Pt called and wanted to know what she could take for constipation

## 2020-11-05 ENCOUNTER — Encounter: Payer: Self-pay | Admitting: Family

## 2020-11-05 ENCOUNTER — Ambulatory Visit (INDEPENDENT_AMBULATORY_CARE_PROVIDER_SITE_OTHER): Payer: PPO

## 2020-11-05 ENCOUNTER — Other Ambulatory Visit: Payer: Self-pay

## 2020-11-05 ENCOUNTER — Ambulatory Visit (INDEPENDENT_AMBULATORY_CARE_PROVIDER_SITE_OTHER): Payer: PPO | Admitting: Family

## 2020-11-05 VITALS — BP 120/58 | HR 64 | Temp 96.8°F | Ht 65.0 in | Wt 132.8 lb

## 2020-11-05 DIAGNOSIS — M79622 Pain in left upper arm: Secondary | ICD-10-CM | POA: Diagnosis not present

## 2020-11-05 DIAGNOSIS — I700 Atherosclerosis of aorta: Secondary | ICD-10-CM | POA: Diagnosis not present

## 2020-11-05 DIAGNOSIS — F418 Other specified anxiety disorders: Secondary | ICD-10-CM

## 2020-11-05 DIAGNOSIS — M25512 Pain in left shoulder: Secondary | ICD-10-CM

## 2020-11-05 DIAGNOSIS — M25522 Pain in left elbow: Secondary | ICD-10-CM | POA: Diagnosis not present

## 2020-11-05 DIAGNOSIS — G2581 Restless legs syndrome: Secondary | ICD-10-CM | POA: Diagnosis not present

## 2020-11-05 MED ORDER — DULOXETINE HCL 60 MG PO CPEP: 60.0000 mg | ORAL_CAPSULE | Freq: Every day | ORAL | 1 refills | Status: DC

## 2020-11-05 MED ORDER — ROPINIROLE HCL 1 MG PO TABS: 1.0000 mg | ORAL_TABLET | Freq: Every day | ORAL | 0 refills | Status: DC

## 2020-11-05 NOTE — Assessment & Plan Note (Signed)
Chronic, stable. Continue crestor 10mg 

## 2020-11-05 NOTE — Assessment & Plan Note (Signed)
Unchanged.Concern for rotator cuff etiology complicated AC joint dysfunction.  Pending XR humerus and elbow joint. Orthopedic consult.

## 2020-11-05 NOTE — Progress Notes (Signed)
Subjective:    Patient ID: Michelle Walsh, female    DOB: 09-Mar-1956, 64 y.o.   MRN: [id number]  CC: Michelle Walsh is a 65 y.o. female who presents today for follow up.   HPI: Feels well today She is here to follow up on left shoulder She has no other concerns.    Continues to have anterior left shoulder pain , constantly, 3 months, unchanged. Pain is exacerbated with lifting or rotating such when lifting arm to replace the comforter on her bed. Sleeps with left arm under her, flexed. She has noticed medial side olecranon appears larger than right. She feels may be swollen. No olecranon pain or pain with movement of left olecranon.    Started cymbalta 3 months ago in setting of left shoulder pain and feels anxiety has greatly improved. She is interested in increasing.   She has taken mobic daily with improvement of right knee pain. She hasnt noticed pain relief in shoulder however.   Aortic atherosclerosis seen on Xr left shoulder. She is compliant with crestor.   03/20/20 EKG QTC 439   HISTORY:  Past Medical History:  Diagnosis Date   Anxiety    Chronic pain of right knee    Depression    Osteoarthritis    Restless leg syndrome    Tremors of nervous system    Vitamin D deficiency    Past Surgical History:  Procedure Laterality Date   CESAREAN SECTION     teeth all removed     Family History  Problem Relation Age of Onset   Breast cancer Maternal Grandmother 66   Seizures Father     Allergies: Ciprofloxacin and Codeine Current Outpatient Medications on File Prior to Visit  Medication Sig Dispense Refill   alendronate (FOSAMAX) 70 MG tablet TAKE 1 TABLET BY MOUTH EVERY 7 (SEVEN) DAYS. TAKE WITH A FULL GLASS OF WATER ON AN EMPTY STOMACH. 12 tablet 3   busPIRone (BUSPAR) 10 MG tablet TAKE 1 TABLET BY MOUTH TWICE A DAY 180 tablet 1   diclofenac sodium (VOLTAREN) 1 % GEL as needed.      fluticasone (FLONASE) 50 MCG/ACT nasal spray Place 2 sprays into both nostrils  daily.     meloxicam (MOBIC) 7.5 MG tablet Take 1 tablet (7.5 mg total) by mouth daily as needed for pain. 30 tablet 2   primidone (MYSOLINE) 50 MG tablet TAKE 1 TABLET (50 MG TOTAL) BY MOUTH IN THE MORNING AND AT BEDTIME. 180 tablet 1   rosuvastatin (CRESTOR) 10 MG tablet Take 1 tablet (10 mg total) by mouth daily. 90 tablet 3   No current facility-administered medications on file prior to visit.    Social History   Tobacco Use   Smoking status: Never   Smokeless tobacco: Never  Vaping Use   Vaping Use: Some days  Substance Use Topics   Alcohol use: Never    Comment: very rare - 1 time per year   Drug use: Yes    Types: Marijuana    Comment: does it thru a vape - uses THC - "helps with anxiety"    Review of Systems  Constitutional:  Negative for chills and fever.  Respiratory:  Negative for cough.   Cardiovascular:  Negative for chest pain and palpitations.  Gastrointestinal:  Negative for nausea and vomiting.  Musculoskeletal:  Positive for arthralgias and joint swelling.     Objective:    BP (!) 120/58 (BP Location: Left Arm, Patient Position: Sitting)   Pulse  64   Temp (!) 96.8 F (36 C)   Ht 5\' 5"  (1.651 m)   Wt 132 lb 12.8 oz (60.2 kg)   SpO2 97%   BMI 22.10 kg/m  BP Readings from Last 3 Encounters:  11/05/20 (!) 120/58  08/14/20 102/60  03/20/20 118/62   Wt Readings from Last 3 Encounters:  11/05/20 132 lb 12.8 oz (60.2 kg)  08/14/20 132 lb 9.6 oz (60.1 kg)  03/20/20 133 lb (60.3 kg)    Physical Exam Vitals reviewed.  Constitutional:      Appearance: She is well-developed.  Eyes:     Conjunctiva/sclera: Conjunctivae normal.  Cardiovascular:     Rate and Rhythm: Normal rate and regular rhythm.     Pulses: Normal pulses.     Heart sounds: Normal heart sounds.  Pulmonary:     Effort: Pulmonary effort is normal.     Breath sounds: Normal breath sounds. No wheezing, rhonchi or rales.  Musculoskeletal:     Left shoulder: Bony tenderness (over AC  joint) present. No swelling or effusion. Decreased range of motion.     Left elbow: No swelling. Tenderness present in medial epicondyle.     Comments: Left Shoulder:   No asymmetry of shoulders when comparing right and left.Pain with palpation over  AC joint. Pain with internal and external rotation. No pain with resisted lateral extension .  Negative drop arm.  No pain, swelling, or ecchymosis noted over long head of biceps.  No bicep pain with resisted flexion and extension.  Negative speeds test.   Strength and sensation normal BUE's. No pain with flexion, extension at left olecranon.   Skin:    General: Skin is warm and dry.  Neurological:     Mental Status: She is alert.  Psychiatric:        Speech: Speech normal.        Behavior: Behavior normal.        Thought Content: Thought content normal.       Assessment & Plan:   Problem List Items Addressed This Visit       Cardiovascular and Mediastinum   Aortic atherosclerosis (HCC)    Chronic, stable. Continue crestor 10mg .          Other   Depression with anxiety    Improved with cymbalta. We agreed reasonable to increase cymbalta to 60mg . Continue buspar 10mg  bid.         Relevant Medications   DULoxetine (CYMBALTA) 60 MG capsule   Left shoulder pain - Primary    Unchanged.Concern for rotator cuff etiology complicated AC joint dysfunction.  Pending XR humerus and elbow joint. Orthopedic consult.         Relevant Orders   DG Humerus Left   DG Elbow Complete Left   Ambulatory referral to Orthopedic Surgery   Restless leg syndrome   Relevant Medications   rOPINIRole (REQUIP) 1 MG tablet     I have changed [name]. [place]'s DULoxetine. I am also having her maintain her diclofenac sodium, fluticasone, rosuvastatin, alendronate, busPIRone, primidone, meloxicam, and rOPINIRole.   Meds ordered this encounter  Medications   rOPINIRole (REQUIP) 1 MG tablet    Sig: Take 1 tablet (1 mg total) by mouth at  bedtime.    Dispense:  90 tablet    Refill:  0   DULoxetine (CYMBALTA) 60 MG capsule    Sig: Take 1 capsule (60 mg total) by mouth daily.    Dispense:  90 capsule    Refill:  1    Order Specific Question:   Supervising Provider    Answer:   [name] [[id number]]    Return precautions given.   Risks, benefits, and alternatives of the medications and treatment plan prescribed today were discussed, and patient expressed understanding.   Education regarding symptom management and diagnosis given to patient on AVS.  Continue to follow with [name], FNP for routine health maintenance.   [name] and I agreed with plan.   [name], FNP

## 2020-11-05 NOTE — Assessment & Plan Note (Addendum)
Improved with cymbalta. We agreed reasonable to increase cymbalta to 60mg . Continue buspar 10mg  bid.

## 2020-11-05 NOTE — Telephone Encounter (Signed)
noted 

## 2020-11-05 NOTE — Patient Instructions (Signed)
Referral to orthopedics Let us know if you dont hear back within a week in regards to an appointment being scheduled.

## 2020-11-06 ENCOUNTER — Other Ambulatory Visit: Payer: Self-pay | Admitting: Family

## 2020-11-06 DIAGNOSIS — M159 Polyosteoarthritis, unspecified: Secondary | ICD-10-CM

## 2020-11-07 DIAGNOSIS — M67814 Other specified disorders of tendon, left shoulder: Secondary | ICD-10-CM | POA: Diagnosis not present

## 2020-11-15 ENCOUNTER — Ambulatory Visit: Payer: PPO

## 2020-11-19 DIAGNOSIS — M67814 Other specified disorders of tendon, left shoulder: Secondary | ICD-10-CM | POA: Diagnosis not present

## 2020-11-21 DIAGNOSIS — M67814 Other specified disorders of tendon, left shoulder: Secondary | ICD-10-CM | POA: Diagnosis not present

## 2020-12-12 DIAGNOSIS — M25612 Stiffness of left shoulder, not elsewhere classified: Secondary | ICD-10-CM | POA: Diagnosis not present

## 2020-12-12 DIAGNOSIS — M7542 Impingement syndrome of left shoulder: Secondary | ICD-10-CM | POA: Diagnosis not present

## 2020-12-12 DIAGNOSIS — M25512 Pain in left shoulder: Secondary | ICD-10-CM | POA: Diagnosis not present

## 2020-12-18 ENCOUNTER — Encounter: Payer: Self-pay | Admitting: Emergency Medicine

## 2020-12-18 ENCOUNTER — Emergency Department
Admission: EM | Admit: 2020-12-18 | Discharge: 2020-12-18 | Disposition: A | Discharge status: Home / Self Care | Payer: PPO | Attending: Student in an Organized Health Care Education/Training Program | Admitting: Student in an Organized Health Care Education/Training Program

## 2020-12-18 ENCOUNTER — Other Ambulatory Visit: Payer: Self-pay

## 2020-12-18 ENCOUNTER — Emergency Department: Payer: PPO

## 2020-12-18 ENCOUNTER — Ambulatory Visit
Admission: EM | Admit: 2020-12-18 | Discharge: 2020-12-18 | Disposition: A | Discharge status: Home / Self Care | Payer: PPO

## 2020-12-18 DIAGNOSIS — R1084 Generalized abdominal pain: Secondary | ICD-10-CM | POA: Diagnosis not present

## 2020-12-18 DIAGNOSIS — N3000 Acute cystitis without hematuria: Secondary | ICD-10-CM | POA: Insufficient documentation

## 2020-12-18 DIAGNOSIS — K5900 Constipation, unspecified: Secondary | ICD-10-CM | POA: Diagnosis not present

## 2020-12-18 DIAGNOSIS — R109 Unspecified abdominal pain: Secondary | ICD-10-CM | POA: Diagnosis not present

## 2020-12-18 DIAGNOSIS — R918 Other nonspecific abnormal finding of lung field: Secondary | ICD-10-CM | POA: Diagnosis not present

## 2020-12-18 LAB — URINALYSIS, COMPLETE (UACMP) WITH MICROSCOPIC
Bilirubin Urine: NEGATIVE
Glucose, UA: NEGATIVE mg/dL
Ketones, ur: NEGATIVE mg/dL
Nitrite: NEGATIVE
Protein, ur: NEGATIVE mg/dL
Specific Gravity, Urine: 1.017 (ref 1.005–1.030)
pH: 6 (ref 5.0–8.0)

## 2020-12-18 LAB — CBC WITH DIFFERENTIAL/PLATELET
Abs Immature Granulocytes: 0.05 10*3/uL (ref 0.00–0.07)
Basophils Absolute: 0.1 10*3/uL (ref 0.0–0.1)
Basophils Relative: 0 %
Eosinophils Absolute: 0.2 10*3/uL (ref 0.0–0.5)
Eosinophils Relative: 1 %
HCT: 37.1 % (ref 36.0–46.0)
Hemoglobin: 12.1 g/dL (ref 12.0–15.0)
Immature Granulocytes: 0 %
Lymphocytes Relative: 13 %
Lymphs Abs: 2.1 10*3/uL (ref 0.7–4.0)
MCH: 30.7 pg (ref 26.0–34.0)
MCHC: 32.6 g/dL (ref 30.0–36.0)
MCV: 94.2 fL (ref 80.0–100.0)
Monocytes Absolute: 1 10*3/uL (ref 0.1–1.0)
Monocytes Relative: 6 %
Neutro Abs: 13.3 10*3/uL — ABNORMAL HIGH (ref 1.7–7.7)
Neutrophils Relative %: 80 %
Platelets: 305 10*3/uL (ref 150–400)
RBC: 3.94 MIL/uL (ref 3.87–5.11)
RDW: 13.8 % (ref 11.5–15.5)
WBC: 16.7 10*3/uL — ABNORMAL HIGH (ref 4.0–10.5)
nRBC: 0 % (ref 0.0–0.2)

## 2020-12-18 LAB — COMPREHENSIVE METABOLIC PANEL
ALT: 15 U/L (ref 0–44)
AST: 18 U/L (ref 15–41)
Albumin: 3.9 g/dL (ref 3.5–5.0)
Alkaline Phosphatase: 54 U/L (ref 38–126)
Anion gap: 11 (ref 5–15)
BUN: 19 mg/dL (ref 8–23)
CO2: 29 mmol/L (ref 22–32)
Calcium: 9 mg/dL (ref 8.9–10.3)
Chloride: 97 mmol/L — ABNORMAL LOW (ref 98–111)
Creatinine, Ser: 0.98 mg/dL (ref 0.44–1.00)
GFR, Estimated: >60 mL/min (ref >60)
Glucose, Bld: 109 mg/dL — ABNORMAL HIGH (ref 70–99)
Potassium: 3.9 mmol/L (ref 3.5–5.1)
Sodium: 137 mmol/L (ref 135–145)
Total Bilirubin: 0.9 mg/dL (ref 0.3–1.2)
Total Protein: 7.4 g/dL (ref 6.5–8.1)

## 2020-12-18 LAB — CBC
HCT: 37.6 % (ref 36.0–46.0)
Hemoglobin: 12.1 g/dL (ref 12.0–15.0)
MCH: 30.3 pg (ref 26.0–34.0)
MCHC: 32.2 g/dL (ref 30.0–36.0)
MCV: 94.2 fL (ref 80.0–100.0)
Platelets: 306 10*3/uL (ref 150–400)
RBC: 3.99 MIL/uL (ref 3.87–5.11)
RDW: 13.7 % (ref 11.5–15.5)
WBC: 17.1 10*3/uL — ABNORMAL HIGH (ref 4.0–10.5)
nRBC: 0 % (ref 0.0–0.2)

## 2020-12-18 LAB — LIPASE, BLOOD: Lipase: 29 U/L (ref 11–51)

## 2020-12-18 MED ORDER — SORBITOL 70 % SOLN
960.0000 mL | TOPICAL_OIL | Freq: Once | ORAL | Status: AC
  Administered 2020-12-18: 960 mL via RECTAL
  Filled 2020-12-18: qty 473

## 2020-12-18 MED ORDER — IOHEXOL 350 MG/ML SOLN
80.0000 mL | Freq: Once | INTRAVENOUS | Status: AC | PRN
  Administered 2020-12-18: 80 mL via INTRAVENOUS
  Filled 2020-12-18: qty 80

## 2020-12-18 MED ORDER — CEPHALEXIN 500 MG PO CAPS
1000.0000 mg | ORAL_CAPSULE | Freq: Once | ORAL | Status: AC
  Administered 2020-12-18: 1000 mg via ORAL
  Filled 2020-12-18: qty 2

## 2020-12-18 MED ORDER — MORPHINE SULFATE (PF) 4 MG/ML IV SOLN
4.0000 mg | Freq: Once | INTRAVENOUS | Status: AC
  Administered 2020-12-18: 4 mg via INTRAVENOUS
  Filled 2020-12-18: qty 1

## 2020-12-18 MED ORDER — CEPHALEXIN 500 MG PO CAPS: 500.0000 mg | ORAL_CAPSULE | Freq: Four times a day (QID) | ORAL | 0 refills | Status: DC

## 2020-12-18 MED ORDER — ONDANSETRON HCL 4 MG/2ML IJ SOLN
INTRAMUSCULAR | Status: AC
  Administered 2020-12-18: 4 mg
  Filled 2020-12-18: qty 2

## 2020-12-18 NOTE — Discharge Instructions (Addendum)
Your current condition warrants further evaluation and/or treatment which exceed services available to you in this urgent care setting. I have discussed with you your currrent condition and the need for further evaluation and/or treatment in an emergency department setting. In response to my medical recommendation, you have opted to go to the emergency department. 

## 2020-12-18 NOTE — ED Notes (Signed)
Given approx 100 ml of enema  Tolerated well

## 2020-12-18 NOTE — Discharge Instructions (Addendum)
You can use Colace, Senokot, and Metamucil safely together for your constipation. Following the dosing on the package. You have also been prescribed Keflex. Please follow up with your PCP, or return to the ER if you develop any worsening, including worsening pain or fever.

## 2020-12-18 NOTE — ED Provider Notes (Signed)
Chief Complaint   Chief Complaint  Patient presents with   Abdominal Pain     Subjective, HPI  Michelle Walsh is a very pleasant 65 y.o. female who presents with 10/10 abdominal pain for which she reports a stabbing since Sunday.  Patient reports increasing her dosage of meloxicam, but states that she has not had a decent bowel movement since Sunday.  Patient reports bloating and distention to her stomach.  Patient reports "this hurts worse than when I had a baby".  Patient does not report any fever, chills, vomiting.  She reports that she is passing gas.  Patient does not report any history of diverticulitis.  History obtained from patient.   Patient's problem list, past medical and social history, medications, and allergies were reviewed by me and updated in Epic.    ROS  See HPI.  Objective   Vitals:   12/18/20 0956  BP: 120/83  Pulse: 84  Resp: 20  Temp: (!) 97.5 F (36.4 C)  SpO2: 95%    Vital signs and nursing note reviewed.   No LMP recorded. Patient is postmenopausal.   General: Appears well-developed and well-nourished. No acute distress.  HEENT Head: Normocephalic and atraumatic.  Eyes: Conjunctivae and EOM are normal. No eye drainage or scleral icterus bilaterally.  Ears: Hearing grossly intact, no drainage or visible deformity.  Nose: No nasal deviation or rhinorrhea.  Mouth/Throat: No stridor or tracheal deviation. Neck: Normal range of motion, neck is supple.  Cardiovascular: Normal rate Pulm/Chest: No respiratory distress Abdominal: Soft, distended, and severely tender with guarding. Bowel tones hypotonic in all quadrants. Unable to completely examine abdomen due to pain. Neurological: Alert and oriented to person, place, and time.  Skin: Skin is warm and dry.  Psychiatric: Normal mood, affect, behavior, and thought content.   Assessment & Plan  1. Generalized abdominal pain   65 y.o. female presents with 10/10 abdominal pain for which she reports a  stabbing since Sunday.  Patient reports increasing her dosage of meloxicam, but states that she has not had a decent bowel movement since Sunday.  Patient reports bloating and distention to her stomach.  Patient reports "this hurts worse than when I had a baby".  Patient does not report any fever, chills, vomiting.  She reports that she is passing gas.  Patient does not report any history of diverticulitis.  Chart review completed.  Given symptoms along with assessment findings and pain level in clinic today, I feel as if the patient would be best served in the emergency department and will need additional work-up and imaging for which we are unable to provide in the urgent care setting.  Patient is stable on discharge and will present to the emergency department via private vehicle.  Differentials include: Diverticulitis, obstruction, intra-abdominal abscess solely from exquisite tenderness with mild palpation of abdomen.  Plan:   Discharge Instructions      Your current condition warrants further evaluation and/or treatment which exceed services available to you in this urgent care setting. I have discussed with you your currrent condition and the need for further evaluation and/or treatment in an emergency department setting. In response to my medical recommendation, you have opted to go to the emergency department.         Serafina Royals, [place] 12/18/20 1015

## 2020-12-18 NOTE — ED Triage Notes (Signed)
Pt reports that she was at the ortho MD 2 weeks ago and they doubled up on her Meloxicam and has not been able to have a BM since Sunday. But it was not a good BM. Her stomach is bloated.

## 2020-12-18 NOTE — ED Triage Notes (Signed)
Pt here with 10/10 LLQ, stabbing pain since Sunday. Doubled up on Meloxicam from ortho 2 weeks ago and has not has a good bowel movement since Sunday. Stomach is bloated and distended.

## 2020-12-18 NOTE — ED Notes (Signed)
Up to [facility].

## 2020-12-18 NOTE — ED Notes (Signed)
Pt has small results with first round of enema; approximately 169m given; pt encouraged to hold enema and call when needed; pt verbalized understanding

## 2020-12-18 NOTE — ED Provider Notes (Signed)
[facility] Emergency Department Provider Note  ____________________________________________   Event Date/Time   First MD Initiated Contact with Patient 12/18/20 1437     (approximate)  I have reviewed the triage vital signs and the nursing notes.   HISTORY  Chief Complaint Abdominal Pain and Constipation   HPI Michelle Walsh is a 65 y.o. female who presents to the emergency department for evaluation of constipation and abdominal pain.  Patient rates her abdominal pain a 10/10, states this hurts "worse than having a baby".  She reports last bowel movement was a very small movement on Sunday, was having normal regular movements prior to Sunday.  She did have a recent change in medication to increase meloxicam 2 weeks ago, also recently had diet changed to cut out sodas.  She is unaware of any other medication or diet changes prior to onset of symptoms.  She denies any fever, nausea or vomiting, dysuria.  She reports that she is still passing gas, though reports it to "very small amount compared to normal".         Past Medical History:  Diagnosis Date   Anxiety    Chronic pain of right knee    Depression    Osteoarthritis    Restless leg syndrome    Tremors of nervous system    Vitamin D deficiency     Patient Active Problem List   Diagnosis Date Noted   Aortic atherosclerosis ([place]) 11/05/2020   Left shoulder pain 08/14/2020   Bradycardia 12/25/2019   HLD (hyperlipidemia) 12/25/2019   Decreased hearing 10/24/2019   Restless leg syndrome    Osteopenia 07/24/2019   Tremor of left hand 07/24/2019   Uterine prolapse 07/24/2019   Screening for breast cancer 04/19/2019   Routine physical examination 04/19/2019   Cervical dystonia 12/21/2018   Vitamin D deficiency 12/21/2018   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 Authorizing Provider  cephALEXin (KEFLEX) 500 MG capsule Take 1 capsule (500 mg total) by mouth 4 (four) times daily for 7 days. 12/18/20 12/25/20 Yes Abdirahim Flavell, Farrel Gordon, PA  alendronate (FOSAMAX) 70 MG tablet TAKE 1 TABLET BY MOUTH EVERY 7 (SEVEN) DAYS. TAKE WITH A FULL GLASS OF WATER ON AN EMPTY STOMACH. 06/24/20   [name], FNP  busPIRone (BUSPAR) 10 MG tablet TAKE 1 TABLET BY MOUTH TWICE A DAY 07/01/20   [name], FNP  diclofenac sodium (VOLTAREN) 1 % GEL as needed.  11/17/18   [provider]  DULoxetine (CYMBALTA) 60 MG capsule Take 1 capsule (60 mg total) by mouth daily. 11/05/20   [name], FNP  fluticasone (FLONASE) 50 MCG/ACT nasal spray Place 2 sprays into both nostrils daily. 10/31/19   [provider]  meloxicam (MOBIC) 7.5 MG tablet TAKE 1 TABLET BY MOUTH DAILY AS NEEDED FOR PAIN 11/06/20   [name], FNP  primidone (MYSOLINE) 50 MG tablet TAKE 1 TABLET (50 MG TOTAL) BY MOUTH IN THE MORNING AND AT BEDTIME. 07/25/20   [name], FNP  rOPINIRole (REQUIP) 1 MG tablet Take 1 tablet (1 mg total) by mouth at bedtime. 11/05/20  [name], FNP  rosuvastatin (CRESTOR) 10 MG tablet Take 1 tablet (10 mg total) by mouth daily. 12/25/19   [name], FNP    Allergies Ciprofloxacin and Codeine  Family History  Problem Relation Age of Onset   Breast cancer Maternal Grandmother 64   Seizures Father     Social History Social History   Tobacco Use   Smoking status: Never   Smokeless tobacco: Never  Vaping Use   Vaping Use: Some days  Substance Use Topics   Alcohol use: Never    Comment: very rare - 1 time per year   Drug use: Yes    Types: Marijuana    Comment: does it thru a vape - uses THC - "helps with anxiety"     Review of Systems Constitutional: No fever/chills Eyes: No visual changes. ENT: No sore throat. Cardiovascular: Denies chest pain. Respiratory: Denies shortness of breath. Gastrointestinal: + abdominal pain.  No nausea, no vomiting.  No diarrhea.  + constipation. Genitourinary: Negative for dysuria. Musculoskeletal: Negative for back pain. Skin: Negative for rash. Neurological: Negative for headaches, focal weakness or numbness.  ____________________________________________   PHYSICAL EXAM:  VITAL SIGNS: ED Triage Vitals [12/18/20 1043]  Enc Vitals Group     BP 111/81     Pulse Rate 71     Resp 20     Temp 98.4 F (36.9 C)     Temp Source Oral     SpO2 98 %     Weight 132 lb (59.9 kg)     Height '5\' 4"'$  (1.626 m)     Head Circumference      Peak Flow      Pain Score 10     Pain Loc      Pain Edu?      Excl. in [place]?    Constitutional: Alert and oriented. Well appearing and in no acute distress. Eyes: Conjunctivae are normal. PERRL. EOMI. Head: Atraumatic. Nose: No congestion/rhinnorhea. Mouth/Throat: Mucous membranes are moist.  Oropharynx non-erythematous. Neck: No stridor.   Cardiovascular: Normal rate, regular rhythm. Grossly normal heart sounds.  Good peripheral circulation. Respiratory: Normal respiratory effort.  No retractions. Lungs CTAB. Gastrointestinal: Soft, with mild distention noted.  There is no tenderness to the right upper quadrant or left upper quadrant.  There is mild tenderness to the right lower quadrant, more significant tenderness to the left lower quadrant as well as suprapubic region.  No abdominal bruits. No CVA tenderness.  Rectal exam with no palpable stool in the vault. Musculoskeletal: No lower extremity tenderness nor edema.  No joint effusions. Neurologic:  Normal speech and language. No gross focal neurologic deficits are appreciated. No gait instability. Skin:  Skin is warm, dry and intact. No rash noted. Psychiatric: Mood and affect  are normal. Speech and behavior are normal.  ____________________________________________   LABS (all labs ordered are listed, but only abnormal results are displayed)  Labs Reviewed  COMPREHENSIVE METABOLIC PANEL - Abnormal; Notable for the following components:      Result Value   Chloride 97 (*)    Glucose, Bld 109 (*)    All other components within normal limits  CBC - Abnormal; Notable for the following components:   WBC 17.1 (*)    All other components within normal limits  URINALYSIS, COMPLETE (UACMP) WITH MICROSCOPIC - Abnormal; Notable for the following components:   Color, Urine YELLOW (*)    APPearance HAZY (*)    Hgb urine dipstick MODERATE (*)  Leukocytes,Ua TRACE (*)    Bacteria, UA RARE (*)    All other components within normal limits  CBC WITH DIFFERENTIAL/PLATELET - Abnormal; Notable for the following components:   WBC 16.7 (*)    Neutro Abs 13.3 (*)    All other components within normal limits  URINE CULTURE  LIPASE, BLOOD  DIFFERENTIAL    ____________________________________________  RADIOLOGY   Official radiology report(s): CT ABDOMEN PELVIS W CONTRAST  Result Date: 12/18/2020 CLINICAL DATA:  Abdominal pain, acute, nonlocalized EXAM: CT ABDOMEN AND PELVIS WITH CONTRAST TECHNIQUE: Multidetector CT imaging of the abdomen and pelvis was performed using the standard protocol following bolus administration of intravenous contrast. CONTRAST:  33m OMNIPAQUE IOHEXOL 350 MG/ML SOLN COMPARISON:  None. FINDINGS: Inferior chest: The lung bases are well-aerated. Hepatobiliary: The liver is normal in size without focal abnormality. No intrahepatic or extrahepatic biliary ductal dilation. The gallbladder appears normal. Spleen: Normal in size without focal abnormality. Pancreas: No pancreatic ductal dilatation or surrounding inflammatory changes. Adrenals/Urinary Tract: Adrenal glands are unremarkable. Kidneys are normal, without renal calculi, focal lesion, or  hydronephrosis. Bladder is unremarkable. Stomach/Bowel: The stomach, small bowel and large bowel are normal in caliber without abnormal wall thickening or surrounding inflammatory changes. Moderate to large stool burden in the colon. Reproductive: Uterus and bilateral adnexa are unremarkable. Lymphatic: Scattered prominent lymph nodes in the lower mid abdomen appear reactive. Vasculature: The abdominal aorta is normal in caliber. The portal venous system is patent. Other: No abdominopelvic ascites. Musculoskeletal: No aggressive osseous lesions. The soft tissues are unremarkable. IMPRESSION: 1. Moderate to large stool burden in the colon. 2. There are a few scattered prominent lymph nodes in the lower mid abdomen which are favored reactive, and can be seen in the setting of enteritis. Electronically Signed   By: YAlbin FellingM.D.   On: 12/18/2020 15:37   DG Abdomen Acute W/Chest  Result Date: 12/18/2020 CLINICAL DATA:  Abdominal pain EXAM: DG ABDOMEN ACUTE WITH 1 VIEW CHEST COMPARISON:  None. FINDINGS: There is no evidence of dilated bowel loops or free intraperitoneal air. Large amount of stool throughout the colon. No radiopaque calculi or other significant radiographic abnormality is seen. Heart size and mediastinal contours are within normal limits. No focal consolidation, pleural effusion or pneumothorax. Calcified left lower lobe pulmonary nodule likely reflecting sequela prior granulomatous disease. IMPRESSION: Large amount of stool throughout the colon. No acute cardiopulmonary disease. Electronically Signed   By: HKathreen DevoidM.D.   On: 12/18/2020 12:33      ____________________________________________   INITIAL IMPRESSION / ASSESSMENT AND PLAN / ED COURSE  As part of my medical decision making, I reviewed the following data within the eElli[place]notes reviewed and incorporated, Labs reviewed, Radiograph reviewed, and Notes from prior ED visits        Patient is  a 65year old female who presents to the emergency department for evaluation of abdominal pain accompanied by constipation, worse this morning.  She previously presented to urgent care, who referred her here given the severity of her pain.  See HPI for further details.  In triage, patient has normal vital signs.  Physical exam as above.  Notably, the patient is quite tender to palpation of the suprapubic and left lower quadrant with mild tenderness of the right lower quadrant region.  Labs were obtained and demonstrate a leukocytosis of 16.7 with a left shift.  CMP grossly within normal limits.  Urinalysis demonstrates rare bacteria, trace leukocytes and moderate hemoglobin.  X-ray  for an acute abdomen with chest was obtained and demonstrates a large stool burden, however no evidence of bowel obstruction.  CT of the abdomen and pelvis with contrast was obtained and again demonstrates large stool burden with some scattered lymph nodes present.  Discussed the findings with the patient.  Smog enema was attempted.  Patient only able to tolerate small amounts at a time, but did produce 1 small bowel movement.  She also reports increased ability to pass gas since passage of the small bowel movement.  Discussed options with the patient, and given that constipation symptoms started 4 days ago, reasonable to continue with outpatient treatment.  She is amenable with this plan.  Will encourage Colace, Senokot and Metamucil combination for constipation.  We will also start the patient on Keflex for possible associated simple cystitis.  The patient is reporting a significant improvement in her pain as well.  She is stable this time for outpatient follow-up, return precautions were discussed at length.      ____________________________________________   FINAL CLINICAL IMPRESSION(S) / ED DIAGNOSES  Final diagnoses:  Constipation, unspecified constipation type  Acute cystitis without hematuria     ED Discharge  Orders          Ordered    cephALEXin (KEFLEX) 500 MG capsule  4 times daily        12/18/20 1939             Note:  This document was prepared using Dragon voice recognition software and may include unintentional dictation errors.    Marlana Salvage, PA 12/18/20 1948    Merlyn Lot, MD 12/19/20 1037

## 2020-12-19 ENCOUNTER — Other Ambulatory Visit: Payer: Self-pay | Admitting: Family

## 2020-12-19 DIAGNOSIS — E785 Hyperlipidemia, unspecified: Secondary | ICD-10-CM

## 2020-12-20 LAB — URINE CULTURE: Culture: <10000 — AB

## 2020-12-23 ENCOUNTER — Emergency Department: Payer: PPO

## 2020-12-23 ENCOUNTER — Inpatient Hospital Stay: Payer: PPO

## 2020-12-23 ENCOUNTER — Encounter: Payer: Self-pay | Admitting: Radiology

## 2020-12-23 ENCOUNTER — Other Ambulatory Visit: Payer: Self-pay

## 2020-12-23 ENCOUNTER — Inpatient Hospital Stay
Admission: EM | Admit: 2020-12-23 | Discharge: 2020-12-26 | DRG: 392 | Disposition: A | Discharge status: Home / Self Care | Payer: PPO | Attending: Student | Admitting: Student

## 2020-12-23 DIAGNOSIS — N179 Acute kidney failure, unspecified: Secondary | ICD-10-CM | POA: Diagnosis present

## 2020-12-23 DIAGNOSIS — F419 Anxiety disorder, unspecified: Secondary | ICD-10-CM | POA: Diagnosis not present

## 2020-12-23 DIAGNOSIS — R1032 Left lower quadrant pain: Secondary | ICD-10-CM | POA: Diagnosis not present

## 2020-12-23 DIAGNOSIS — K5989 Other specified functional intestinal disorders: Secondary | ICD-10-CM

## 2020-12-23 DIAGNOSIS — K5909 Other constipation: Secondary | ICD-10-CM | POA: Diagnosis not present

## 2020-12-23 DIAGNOSIS — F32A Depression, unspecified: Secondary | ICD-10-CM | POA: Diagnosis present

## 2020-12-23 DIAGNOSIS — R1030 Lower abdominal pain, unspecified: Secondary | ICD-10-CM | POA: Diagnosis present

## 2020-12-23 DIAGNOSIS — R1110 Vomiting, unspecified: Secondary | ICD-10-CM | POA: Diagnosis not present

## 2020-12-23 DIAGNOSIS — G2581 Restless legs syndrome: Secondary | ICD-10-CM | POA: Diagnosis not present

## 2020-12-23 DIAGNOSIS — K5900 Constipation, unspecified: Secondary | ICD-10-CM

## 2020-12-23 DIAGNOSIS — Z803 Family history of malignant neoplasm of breast: Secondary | ICD-10-CM | POA: Diagnosis not present

## 2020-12-23 DIAGNOSIS — R251 Tremor, unspecified: Secondary | ICD-10-CM | POA: Diagnosis not present

## 2020-12-23 DIAGNOSIS — N182 Chronic kidney disease, stage 2 (mild): Secondary | ICD-10-CM | POA: Diagnosis present

## 2020-12-23 DIAGNOSIS — M8580 Other specified disorders of bone density and structure, unspecified site: Secondary | ICD-10-CM | POA: Diagnosis present

## 2020-12-23 DIAGNOSIS — E785 Hyperlipidemia, unspecified: Secondary | ICD-10-CM | POA: Diagnosis present

## 2020-12-23 DIAGNOSIS — N390 Urinary tract infection, site not specified: Secondary | ICD-10-CM | POA: Diagnosis present

## 2020-12-23 DIAGNOSIS — E876 Hypokalemia: Secondary | ICD-10-CM | POA: Diagnosis not present

## 2020-12-23 DIAGNOSIS — E559 Vitamin D deficiency, unspecified: Secondary | ICD-10-CM | POA: Diagnosis not present

## 2020-12-23 DIAGNOSIS — Z79899 Other long term (current) drug therapy: Secondary | ICD-10-CM | POA: Diagnosis not present

## 2020-12-23 DIAGNOSIS — Z7983 Long term (current) use of bisphosphonates: Secondary | ICD-10-CM

## 2020-12-23 DIAGNOSIS — R739 Hyperglycemia, unspecified: Secondary | ICD-10-CM | POA: Diagnosis not present

## 2020-12-23 DIAGNOSIS — R109 Unspecified abdominal pain: Secondary | ICD-10-CM | POA: Diagnosis present

## 2020-12-23 DIAGNOSIS — F418 Other specified anxiety disorders: Secondary | ICD-10-CM | POA: Diagnosis not present

## 2020-12-23 DIAGNOSIS — D72829 Elevated white blood cell count, unspecified: Secondary | ICD-10-CM | POA: Insufficient documentation

## 2020-12-23 DIAGNOSIS — R112 Nausea with vomiting, unspecified: Secondary | ICD-10-CM | POA: Diagnosis not present

## 2020-12-23 DIAGNOSIS — R1084 Generalized abdominal pain: Secondary | ICD-10-CM | POA: Diagnosis not present

## 2020-12-23 DIAGNOSIS — R1111 Vomiting without nausea: Secondary | ICD-10-CM | POA: Diagnosis not present

## 2020-12-23 DIAGNOSIS — R1031 Right lower quadrant pain: Secondary | ICD-10-CM | POA: Diagnosis not present

## 2020-12-23 DIAGNOSIS — Z20822 Contact with and (suspected) exposure to covid-19: Secondary | ICD-10-CM | POA: Diagnosis not present

## 2020-12-23 DIAGNOSIS — G250 Essential tremor: Secondary | ICD-10-CM | POA: Diagnosis not present

## 2020-12-23 LAB — LACTIC ACID, PLASMA: Lactic Acid, Venous: 1.4 mmol/L (ref 0.5–1.9)

## 2020-12-23 LAB — URINALYSIS, COMPLETE (UACMP) WITH MICROSCOPIC
Bacteria, UA: NONE SEEN
Bilirubin Urine: NEGATIVE
Glucose, UA: 50 mg/dL — AB
Ketones, ur: 80 mg/dL — AB
Leukocytes,Ua: NEGATIVE
Nitrite: NEGATIVE
Protein, ur: NEGATIVE mg/dL
Specific Gravity, Urine: 1.013 (ref 1.005–1.030)
pH: 5 (ref 5.0–8.0)

## 2020-12-23 LAB — PROCALCITONIN: Procalcitonin: <0.1 ng/mL

## 2020-12-23 LAB — HEMOGLOBIN A1C
Hgb A1c MFr Bld: 5.7 % — ABNORMAL HIGH (ref 4.8–5.6)
Mean Plasma Glucose: 116.89 mg/dL

## 2020-12-23 LAB — CBC
HCT: 39.3 % (ref 36.0–46.0)
Hemoglobin: 12.9 g/dL (ref 12.0–15.0)
MCH: 29.8 pg (ref 26.0–34.0)
MCHC: 32.8 g/dL (ref 30.0–36.0)
MCV: 90.8 fL (ref 80.0–100.0)
Platelets: 360 10*3/uL (ref 150–400)
RBC: 4.33 MIL/uL (ref 3.87–5.11)
RDW: 13.2 % (ref 11.5–15.5)
WBC: 12.8 10*3/uL — ABNORMAL HIGH (ref 4.0–10.5)
nRBC: 0 % (ref 0.0–0.2)

## 2020-12-23 LAB — COMPREHENSIVE METABOLIC PANEL
ALT: 16 U/L (ref 0–44)
AST: 21 U/L (ref 15–41)
Albumin: 4 g/dL (ref 3.5–5.0)
Alkaline Phosphatase: 56 U/L (ref 38–126)
Anion gap: 12 (ref 5–15)
BUN: 21 mg/dL (ref 8–23)
CO2: 25 mmol/L (ref 22–32)
Calcium: 9.2 mg/dL (ref 8.9–10.3)
Chloride: 96 mmol/L — ABNORMAL LOW (ref 98–111)
Creatinine, Ser: 1.14 mg/dL — ABNORMAL HIGH (ref 0.44–1.00)
GFR, Estimated: 53 mL/min — ABNORMAL LOW (ref >60)
Glucose, Bld: 224 mg/dL — ABNORMAL HIGH (ref 70–99)
Potassium: 3.5 mmol/L (ref 3.5–5.1)
Sodium: 133 mmol/L — ABNORMAL LOW (ref 135–145)
Total Bilirubin: 1 mg/dL (ref 0.3–1.2)
Total Protein: 7.4 g/dL (ref 6.5–8.1)

## 2020-12-23 LAB — RESP PANEL BY RT-PCR (FLU A&B, COVID) ARPGX2
Influenza A by PCR: NEGATIVE
Influenza B by PCR: NEGATIVE
SARS Coronavirus 2 by RT PCR: NEGATIVE

## 2020-12-23 LAB — CBG MONITORING, ED
Glucose-Capillary: 131 mg/dL — ABNORMAL HIGH (ref 70–99)
Glucose-Capillary: 123 mg/dL — ABNORMAL HIGH (ref 70–99)

## 2020-12-23 LAB — PROTIME-INR
INR: 1.1 (ref 0.8–1.2)
Prothrombin Time: 14.1 seconds (ref 11.4–15.2)

## 2020-12-23 LAB — APTT: aPTT: 30 seconds (ref 24–36)

## 2020-12-23 LAB — GLUCOSE, CAPILLARY: Glucose-Capillary: 106 mg/dL — ABNORMAL HIGH (ref 70–99)

## 2020-12-23 MED ORDER — ACETAMINOPHEN 325 MG PO TABS: 650.0000 mg | ORAL_TABLET | Freq: Four times a day (QID) | ORAL | Status: DC | PRN

## 2020-12-23 MED ORDER — LACTATED RINGERS IV BOLUS
500.0000 mL | Freq: Once | INTRAVENOUS | Status: AC
  Administered 2020-12-23: 500 mL via INTRAVENOUS

## 2020-12-23 MED ORDER — DULOXETINE HCL 30 MG PO CPEP
60.0000 mg | ORAL_CAPSULE | Freq: Every day | ORAL | Status: DC
  Administered 2020-12-23 – 2020-12-26 (×4): 60 mg via ORAL
  Filled 2020-12-23 (×3): qty 2
  Filled 2020-12-23: qty 1

## 2020-12-23 MED ORDER — MORPHINE SULFATE (PF) 4 MG/ML IV SOLN
4.0000 mg | Freq: Once | INTRAVENOUS | Status: AC
  Administered 2020-12-23: 4 mg via INTRAVENOUS
  Filled 2020-12-23: qty 1

## 2020-12-23 MED ORDER — SORBITOL 70 % SOLN
960.0000 mL | TOPICAL_OIL | Freq: Once | ORAL | Status: AC
  Administered 2020-12-23: 960 mL via RECTAL
  Filled 2020-12-23: qty 473

## 2020-12-23 MED ORDER — SODIUM CHLORIDE 0.9 % IV BOLUS
1000.0000 mL | Freq: Once | INTRAVENOUS | Status: AC
  Administered 2020-12-23: 1000 mL via INTRAVENOUS

## 2020-12-23 MED ORDER — ONDANSETRON HCL 4 MG/2ML IJ SOLN: 4.0000 mg | Freq: Three times a day (TID) | INTRAMUSCULAR | Status: DC | PRN

## 2020-12-23 MED ORDER — POLYETHYLENE GLYCOL 3350 17 G PO PACK
17.0000 g | PACK | Freq: Two times a day (BID) | ORAL | Status: DC
  Administered 2020-12-23 – 2020-12-26 (×7): 17 g via ORAL
  Filled 2020-12-23 (×7): qty 1

## 2020-12-23 MED ORDER — BUSPIRONE HCL 10 MG PO TABS
10.0000 mg | ORAL_TABLET | Freq: Two times a day (BID) | ORAL | Status: DC
  Administered 2020-12-23 – 2020-12-26 (×7): 10 mg via ORAL
  Filled 2020-12-23 (×5): qty 1
  Filled 2020-12-23: qty 2
  Filled 2020-12-23 (×2): qty 1

## 2020-12-23 MED ORDER — DIATRIZOATE MEGLUMINE & SODIUM 66-10 % PO SOLN
1200.0000 mL | Freq: Once | ORAL | Status: AC
  Administered 2020-12-23: 1200 mL
  Filled 2020-12-23: qty 1200

## 2020-12-23 MED ORDER — DIATRIZOATE MEGLUMINE & SODIUM 66-10 % PO SOLN
480.0000 mL | Freq: Once | ORAL | Status: DC
  Filled 2020-12-23: qty 480

## 2020-12-23 MED ORDER — INSULIN ASPART 100 UNIT/ML IJ SOLN
0.0000 [IU] | INTRAMUSCULAR | Status: DC
  Administered 2020-12-23: 2 [IU] via SUBCUTANEOUS

## 2020-12-23 MED ORDER — ROPINIROLE HCL 1 MG PO TABS
1.0000 mg | ORAL_TABLET | Freq: Every day | ORAL | Status: DC
  Administered 2020-12-23 – 2020-12-25 (×3): 1 mg via ORAL
  Filled 2020-12-23 (×4): qty 1

## 2020-12-23 MED ORDER — ONDANSETRON HCL 4 MG/2ML IJ SOLN
4.0000 mg | Freq: Once | INTRAMUSCULAR | Status: AC
  Administered 2020-12-23: 4 mg via INTRAVENOUS
  Filled 2020-12-23: qty 2

## 2020-12-23 MED ORDER — MORPHINE SULFATE (PF) 2 MG/ML IV SOLN: 2.0000 mg | INTRAVENOUS | Status: DC | PRN

## 2020-12-23 MED ORDER — ROSUVASTATIN CALCIUM 10 MG PO TABS
10.0000 mg | ORAL_TABLET | Freq: Every day | ORAL | Status: DC
  Administered 2020-12-23 – 2020-12-26 (×4): 10 mg via ORAL
  Filled 2020-12-23 (×4): qty 1

## 2020-12-23 MED ORDER — IOHEXOL 350 MG/ML SOLN
75.0000 mL | Freq: Once | INTRAVENOUS | Status: AC | PRN
  Administered 2020-12-23: 75 mL via INTRAVENOUS

## 2020-12-23 MED ORDER — SODIUM CHLORIDE 0.9 % IV SOLN: INTRAVENOUS | Status: DC

## 2020-12-23 MED ORDER — FLUTICASONE PROPIONATE 50 MCG/ACT NA SUSP
2.0000 | Freq: Every day | NASAL | Status: DC | PRN
  Filled 2020-12-23: qty 16

## 2020-12-23 MED ORDER — SORBITOL 70 % SOLN
60.0000 mL | Freq: Once | Status: AC
  Administered 2020-12-23: 60 mL via ORAL
  Filled 2020-12-23: qty 60

## 2020-12-23 MED ORDER — SENNOSIDES-DOCUSATE SODIUM 8.6-50 MG PO TABS
1.0000 | ORAL_TABLET | Freq: Two times a day (BID) | ORAL | Status: DC
  Administered 2020-12-23 – 2020-12-25 (×5): 1 via ORAL
  Filled 2020-12-23 (×5): qty 1

## 2020-12-23 MED ORDER — PRIMIDONE 50 MG PO TABS
50.0000 mg | ORAL_TABLET | Freq: Two times a day (BID) | ORAL | Status: DC
  Administered 2020-12-23 – 2020-12-26 (×6): 50 mg via ORAL
  Filled 2020-12-23 (×10): qty 1

## 2020-12-23 MED ORDER — ONDANSETRON 4 MG PO TBDP
4.0000 mg | ORAL_TABLET | Freq: Once | ORAL | Status: AC | PRN
  Administered 2020-12-23: 4 mg via ORAL
  Filled 2020-12-23: qty 1

## 2020-12-23 NOTE — ED Notes (Signed)
Pt taken to bathroom, pt unable to void at this time.

## 2020-12-23 NOTE — ED Notes (Signed)
Patient transported to CT 

## 2020-12-23 NOTE — Consult Note (Addendum)
[facility] SURGICAL CONSULTATION NOTE (initial) - [id number]   HISTORY OF PRESENT ILLNESS (HPI):  65 y.o. female presented to [facility] ED today for evaluation of abdominal pain. Patient initially presented to the ED on 08/24 for complaints of similar abdominal pain and constipation. She had CT Abdomen/Pelvis at that time, which was concerning for constipation and her symptoms were improved with enema and having small bowel movements. She was discharged home with recommendations for bowel regimen. Patient presents to the ED again today for similar symptoms. She continues to have somewhat generalized abdominal pain with associated nausea and emesis. No fever, chills, cough, CP, SOB, or urinary changes. She does report having a large, non-bloody, bowel movement last night. She denied any chronic narcotic use. Only previous surgeries are C-Section.  Work up in the ED showed mild leukocytosis to 12.5K, which is improved from last week at 17.1K. She did have slight AKI with sCr - 1.14. Mild hyponatremia to 133. Lactic acid levels are normal at 1.4. She did have a CT Abdomen/Pelvis as well which was concerning for significant increase in stool burden from previous with cecal dilation to 7 cm and there was some question of possible distal obstructive process vs ileus. She was ultimately admitted to medicine service.   At the time of my evaluation around 1220, she was on the commode after receiving SMOG enema. She reports that she was able to have a small BM. Her abdominal discomfort and distension had improved some, but she still appeared uncomfortable.   Surgery is consulted by hospitalist physician Dr. Ivor Costa, MD in this context for evaluation and management of severe constipation.   PAST MEDICAL HISTORY (PMH):  Past Medical History:  Diagnosis Date   Anxiety    Chronic pain of right knee    Depression    Osteoarthritis    Restless leg syndrome    Tremors of nervous system    Vitamin  D deficiency      PAST SURGICAL HISTORY ([place]):  Past Surgical History:  Procedure Laterality Date   CESAREAN SECTION     teeth all removed       MEDICATIONS:  Prior to Admission medications   Medication Sig Start Date End Date Taking? Authorizing Provider  alendronate (FOSAMAX) 70 MG tablet TAKE 1 TABLET BY MOUTH EVERY 7 (SEVEN) DAYS. TAKE WITH A FULL GLASS OF WATER ON AN EMPTY STOMACH. Patient taking differently: Take 70 mg by mouth every Wednesday. 06/24/20  Yes [name], FNP  busPIRone (BUSPAR) 10 MG tablet TAKE 1 TABLET BY MOUTH TWICE A DAY 07/01/20  Yes Arnett, Yvetta Coder, FNP  cephALEXin (KEFLEX) 500 MG capsule Take 1 capsule (500 mg total) by mouth 4 (four) times daily for 7 days. 12/18/20 12/25/20 Yes Rodgers, Farrel Gordon, PA  DULoxetine (CYMBALTA) 60 MG capsule Take 1 capsule (60 mg total) by mouth daily. 11/05/20  Yes Arnett, Yvetta Coder, FNP  fluticasone (FLONASE) 50 MCG/ACT nasal spray Place 2 sprays into both nostrils daily as needed for allergies or rhinitis.   Yes [provider]  meloxicam (MOBIC) 7.5 MG tablet TAKE 1 TABLET BY MOUTH DAILY AS NEEDED FOR PAIN 11/06/20  Yes [name], FNP  primidone (MYSOLINE) 50 MG tablet TAKE 1 TABLET (50 MG TOTAL) BY MOUTH IN THE MORNING AND AT BEDTIME. 07/25/20  Yes Arnett, Yvetta Coder, FNP  rOPINIRole (REQUIP) 1 MG tablet Take 1 tablet (1 mg total) by mouth at bedtime. 11/05/20  Yes [name], FNP  rosuvastatin (  CRESTOR) 10 MG tablet TAKE 1 TABLET BY MOUTH EVERY DAY 12/19/20  Yes Arnett, Yvetta Coder, FNP     ALLERGIES:  Allergies  Allergen Reactions   Ciprofloxacin     Sick with C.Diff for 2 months after taking medication   Codeine Nausea And Vomiting     SOCIAL HISTORY:  Social History   Socioeconomic History   Marital status: Married    Spouse name: Not on file   Number of children: 2   Years of education: 12   Highest education level: Not on file  Occupational History   Occupation: retired   Tobacco Use   Smoking status: Never   Smokeless tobacco: Never  Scientific laboratory technician Use: Some days  Substance and Sexual Activity   Alcohol use: Never    Comment: very rare - 1 time per year   Drug use: Yes    Types: Marijuana    Comment: does it thru a vape - uses THC - "helps with anxiety"   Sexual activity: Not on file  Other Topics Concern   Not on file  Social History Narrative   2 children; one child lives with her and her 4 children   Right handed   One story home   Social Determinants of Health   Financial Resource Strain: Not on file  Food Insecurity: Not on file  Transportation Needs: Not on file  Physical Activity: Not on file  Stress: Not on file  Social Connections: Not on file  Intimate Partner Violence: Not on file     FAMILY HISTORY:  Family History  Problem Relation Age of Onset   Breast cancer Maternal Grandmother 11   Seizures Father       REVIEW OF SYSTEMS:  Review of Systems  Constitutional:  Negative for chills and fever.  HENT:  Negative for congestion and sore throat.   Respiratory:  Negative for cough and shortness of breath.   Cardiovascular:  Negative for chest pain and palpitations.  Gastrointestinal:  Positive for abdominal pain, constipation, nausea and vomiting. Negative for blood in stool, diarrhea and melena.  Genitourinary:  Negative for dysuria and urgency.  All other systems reviewed and are negative.  VITAL SIGNS:  Temp:  [97.8 F (36.6 C)] 97.8 F (36.6 C) (08/29 0123) Pulse Rate:  [72-96] 96 (08/29 0806) Resp:  [16-18] 17 (08/29 0806) BP: (135-153)/(70-74) 135/72 (08/29 0806) SpO2:  [93 %-100 %] 99 % (08/29 0806) Weight:  [59.9 kg] 59.9 kg (08/29 0123)     Height: '5\' 4"'$  (162.6 cm) Weight: 59.9 kg BMI (Calculated): 22.65   INTAKE/OUTPUT:  No intake/output data recorded.  PHYSICAL EXAM:  Physical Exam Vitals and nursing note reviewed. Exam conducted with a chaperone present.  Constitutional:      General: She is  not in acute distress.    Appearance: She is well-developed. She is not ill-appearing.     Comments: Patient sitting on commode, appears uncomfortable  HENT:     Head: Normocephalic and atraumatic.  Eyes:     General: No scleral icterus.    Extraocular Movements: Extraocular movements intact.     Pupils: Pupils are equal, round, and reactive to light.  Cardiovascular:     Rate and Rhythm: Normal rate.     Heart sounds: Normal heart sounds.  Pulmonary:     Effort: Pulmonary effort is normal. No respiratory distress.     Breath sounds: Normal breath sounds.  Abdominal:     General: There is distension (Mild).  Palpations: Abdomen is soft.     Tenderness: There is generalized abdominal tenderness (Soreness). There is no guarding or rebound.     Comments: Abdomen is soft, mildly distended, she reports generalized soreness without overt tenderness, no rebound/guarding, no peritonitis   Genitourinary:    Comments: Deferred Skin:    General: Skin is warm and dry.     Coloration: Skin is not jaundiced or pale.  Neurological:     General: No focal deficit present.     Mental Status: She is alert and oriented to person, place, and time.  Psychiatric:        Mood and Affect: Mood normal.        Behavior: Behavior normal.     Labs:  CBC Latest Ref Rng & Units 12/23/2020 12/18/2020 12/18/2020  WBC 4.0 - 10.5 K/uL 12.8(H) 16.7(H) 17.1(H)  Hemoglobin 12.0 - 15.0 g/dL 12.9 12.1 12.1  Hematocrit 36.0 - 46.0 % 39.3 37.1 37.6  Platelets 150 - 400 K/uL 360 305 306   CMP Latest Ref Rng & Units 12/23/2020 12/18/2020 09/28/2019  Glucose 70 - 99 mg/dL 224(H) 109(H) 87  BUN 8 - 23 mg/dL '21 19 13  '$ Creatinine 0.44 - 1.00 mg/dL 1.14(H) 0.98 0.92  Sodium 135 - 145 mmol/L 133(L) 137 139  Potassium 3.5 - 5.1 mmol/L 3.5 3.9 3.7  Chloride 98 - 111 mmol/L 96(L) 97(L) 101  CO2 22 - 32 mmol/L 25 29 32  Calcium 8.9 - 10.3 mg/dL 9.2 9.0 8.4  Total Protein 6.5 - 8.1 g/dL 7.4 7.4 -  Total Bilirubin 0.3 - 1.2  mg/dL 1.0 0.9 -  Alkaline Phos 38 - 126 U/L 56 54 -  AST 15 - 41 U/L 21 18 -  ALT 0 - 44 U/L 16 15 -     Imaging studies:   CT Abdomen/Pelvis (08/24) personally reviewed with significant colonic stool burden otherwise no intra-abdominal findings, and radiologist report reviewed below:  IMPRESSION: 1. Moderate to large stool burden in the colon. 2. There are a few scattered prominent lymph nodes in the lower mid abdomen which are favored reactive, and can be seen in the setting of enteritis.  CT Abdomen/Pelvis (12/23/2020) personally reviewed which shows worsening pan-colonic stool burden now with dilated cecum, sigmoid colon appears decompressed however I suspect there is extrinsic compression by dilated cecum, no evidence of perforation/abscess/infection, and radiologist report reviewed below:  IMPRESSION: 1. Markedly distended colon with stool, progressive in the interval since prior study. There is pericecal edema/fluid associated. Apparent mild wall thickening noted in the proximal descending colon with some adjacent trace edema/fluid. There is a relatively abrupt transition from distended sigmoid colon filled with formed stool and the decompressed distal sigmoid colon and rectum. This appearance can be seen in the setting of enema common but obstruction secondary to distal colonic stricture not excluded. Severe colonic ileus could have this appearance. Given the disproportionate distention of the right colon, Ogilvie syndrome is a consideration. No substantial stool volume in the rectum to suggest impaction/stercoral colitis. 2. Mild fullness noted intrarenal collecting system bilaterally and each renal pelvis, progressive in the short interval since prior study. There is mild proximal right hydroureter down to the level of the distended cecum which appears to displace the ureter laterally and potentially compress it between the cecum and the iliopsoas complex. 3. Hazy soft  tissue anterior to the distal aorta involving the IMA origin, nonspecific but potentially related to inflammatory etiology/vasculitis. Appearances similar to the 12/18/2020 exam. 4. Aortic Atherosclerosis (ICD10-I70.0).  Assessment/Plan: (ICD-10's: K59.00) 65 y.o. female with severe symptomatic constipation with dilated cecum which appears to potentially be compressing distal sigmoid colon   - She seems to have some positive, albeit minimal, response to enema in the ED. I will obtained contrast enema to rule out distal obstruction. If this is negative, she will need aggressive bowel regimen from above and below.  - Agree with NPO for now; okay for PO medications - Continue IVF resuscitation; monitor renal function - No emergent surgical intervention warranted - Monitor abdominal examination; on-going bowel function - Consider serial KUBs as needed   - Pain control prn (avoid narcotics if feasible); antiemetics prn   - Mobilization as toerlated  - Further management per primary service; we will follow   All of the above findings and recommendations were discussed with the patient, and all of patient's questions were answered to her expressed satisfaction.  Thank you for the opportunity to participate in this patient's care.   -- Edison Simon, PA-C [facility] 12/23/2020, 11:48 AM [phone] M-F: 7am - 4pm  Review of the EMR shows that her last colonoscopy was in 2015.  It was performed in the [facility] system for recurrent episodes of Clostridium difficile colitis.  A 1.3 cm adenomatous polyp was removed from the sigmoid colon.  It was benign on final pathology. Will await results of BE.  I saw and evaluated the patient.  I agree with the above documentation, exam, and plan, which I have edited where appropriate. Fredirick Maudlin  3:31 PM

## 2020-12-23 NOTE — H&P (Signed)
History and Physical    Michelle Walsh [id number] DOB: 07/15/1955 DOA: 12/23/2020  Referring MD/NP/PA:   PCP: [name], FNP   Patient coming from:  The patient is coming from home.  At baseline, pt is independent for most of ADL.        Chief Complaint: Abdominal pain, abdominal distention, constipation, nausea, vomiting  HPI: Michelle Walsh is a 65 y.o. female with medical history significant of hyperlipidemia, depression with anxiety, RLS, tremor, cerebellar ataxia, uterine prolapse, CKD-2, who presents with abdominal pain, abdominal distention, constipation, nausea, vomiting.  Patient has been having abdominal pain and abdominal distention for more than 8 days, which has been going on for more than 8 days.  The abdominal pain is diffuse, constant, sharp, 8-10 out of 10 in severity, nonradiating.  She has vomited at least 8 times with nonbilious nonbloody vomiting. Associated with constipation.  Patient states that she had 1 bowel movement yesterday evening, then no any bowel movement anymore.  Denies fever or chills.  No chest pain, cough, shortness breath.  Denies any symptoms of UTI.  No hematuria.  Patient was seen in the ED on 8/24. CT can showed moderate to large stool burden in the colon and UA showed trace amount of leukocyte.  Patient was started Keflex empirically for possible cystitis.  Patient has been taking his antibiotics, but symptoms no improvement.  Patient states that she did not have symptoms for UTI.  No hematuria  ED Course: pt was found to have WBC 12.8 (16.7 on 8/24), pending COVID-19 PCR, negative urinalysis, slightly worsening renal function, temperature normal, blood pressure 135/72, heart rate 96, RR 17, oxygen saturation 93-99% on room air.  Blood sugar 224 B. Mapp (patient does not have history of diabetes).  Patient is admitted to [facility] as inpatient.  Review of Systems:   General: no fevers, chills, no body weight gain, has poor appetite, has  fatigue HEENT: no blurry vision, hearing changes or sore throat Respiratory: no dyspnea, coughing, wheezing CV: no chest pain, no palpitations GI: has nausea, vomiting, abdominal pain, constipation GU: no dysuria, burning on urination, increased urinary frequency, hematuria  Ext: no leg edema Neuro: no unilateral weakness, numbness, or tingling, no vision change or hearing loss Skin: no rash, no skin tear. MSK: No muscle spasm, no deformity, no limitation of range of movement in spin Heme: No easy bruising.  Travel history: No recent long distant travel.  Allergy:  Allergies  Allergen Reactions   Ciprofloxacin     Sick with C.Diff for 2 months after taking medication   Codeine Nausea And Vomiting    Past Medical History:  Diagnosis Date   Anxiety    Chronic pain of right knee    Depression    Osteoarthritis    Restless leg syndrome    Tremors of nervous system    Vitamin D deficiency     Past Surgical History:  Procedure Laterality Date   CESAREAN SECTION     teeth all removed      Social History:  reports that she has never smoked. She has never used smokeless tobacco. She reports current drug use. Drug: Marijuana. She reports that she does not drink alcohol.  Family History:  Family History  Problem Relation Age of Onset   Breast cancer Maternal Grandmother 57   Seizures Father      Prior to Admission medications   Medication Sig Start Date End Date Taking? Authorizing Provider  alendronate (FOSAMAX) 70  MG tablet TAKE 1 TABLET BY MOUTH EVERY 7 (SEVEN) DAYS. TAKE WITH A FULL GLASS OF WATER ON AN EMPTY STOMACH. 06/24/20   [name], FNP  busPIRone (BUSPAR) 10 MG tablet TAKE 1 TABLET BY MOUTH TWICE A DAY 07/01/20   [name], FNP  cephALEXin (KEFLEX) 500 MG capsule Take 1 capsule (500 mg total) by mouth 4 (four) times daily for 7 days. 12/18/20 12/25/20  Marlana Salvage, PA  diclofenac sodium (VOLTAREN) 1 % GEL as needed.  11/17/18   [provider]  DULoxetine (CYMBALTA) 60 MG capsule Take 1 capsule (60 mg total) by mouth daily. 11/05/20   [name], FNP  fluticasone (FLONASE) 50 MCG/ACT nasal spray Place 2 sprays into both nostrils daily. 10/31/19   [provider]  meloxicam (MOBIC) 7.5 MG tablet TAKE 1 TABLET BY MOUTH DAILY AS NEEDED FOR PAIN 11/06/20   [name], FNP  primidone (MYSOLINE) 50 MG tablet TAKE 1 TABLET (50 MG TOTAL) BY MOUTH IN THE MORNING AND AT BEDTIME. 07/25/20   [name], FNP  rOPINIRole (REQUIP) 1 MG tablet Take 1 tablet (1 mg total) by mouth at bedtime. 11/05/20   [name], FNP  rosuvastatin (CRESTOR) 10 MG tablet TAKE 1 TABLET BY MOUTH EVERY DAY 12/19/20   [name], FNP    Physical Exam: Vitals:   12/23/20 0122 12/23/20 0123 12/23/20 0645 12/23/20 0806  BP:  (!) 153/74 (!) 146/70 135/72  Pulse:  72 90 96  Resp: '16  18 17  '$ Temp:  97.8 F (36.6 C)    TempSrc: Oral     SpO2:  100% 93% 99%  Weight:  59.9 kg    Height:  '5\' 4"'$  (1.626 m)     General: Not in acute distress HEENT:       Eyes: PERRL, EOMI, no scleral icterus.       ENT: No discharge from the ears and nose, no pharynx injection, no tonsillar enlargement.        Neck: No JVD, no bruit, no mass felt. Heme: No neck lymph node enlargement. Cardiac: S1/S2, RRR, No murmurs, No gallops or rubs. Respiratory: No rales, wheezing, rhonchi or rubs. GI: Significantly distended, diffused tenderness, worse on bilateral sides of abdomen, no rebound pain, no organomegaly, BS present. GU: No hematuria Ext: No pitting leg edema bilaterally. 1+DP/PT pulse bilaterally. Musculoskeletal: No joint deformities, No joint redness or warmth, no limitation of ROM in spin. Skin: No rashes.  Neuro: Alert, oriented X3, cranial nerves II-XII grossly intact, moves all extremities normally.  Psych: Patient is not psychotic, no suicidal or hemocidal ideation.  Labs on Admission: I have personally reviewed following  labs and imaging studies  CBC: Recent Labs  Lab 12/18/20 1049 12/23/20 0126  WBC 16.7*  17.1* 12.8*  NEUTROABS 13.3*  --   HGB 12.1  12.1 12.9  HCT 37.1  37.6 39.3  MCV 94.2  94.2 90.8  PLT 305  306 [id number]   Basic Metabolic Panel: Recent Labs  Lab 12/18/20 1049 12/23/20 0126  NA 137 133*  K 3.9 3.5  CL 97* 96*  CO2 29 25  GLUCOSE 109* 224*  BUN 19 21  CREATININE 0.98 1.14*  CALCIUM 9.0 9.2   GFR: Estimated Creatinine Clearance: 41.4 mL/min (A) (by C-G formula based on SCr of 1.14 mg/dL (H)). Liver Function Tests: Recent Labs  Lab 12/18/20 1049 12/23/20 0126  AST 18 21  ALT 15 16  ALKPHOS 54 56  BILITOT 0.9 1.0  PROT 7.4 7.4  ALBUMIN 3.9 4.0   Recent Labs  Lab 12/18/20 1049  LIPASE 29   No results for input(s): AMMONIA in the last 168 hours. Coagulation Profile: Recent Labs  Lab 12/23/20 0952  INR 1.1   Cardiac Enzymes: No results for input(s): CKTOTAL, CKMB, CKMBINDEX, TROPONINI in the last 168 hours. BNP (last 3 results) No results for input(s): PROBNP in the last 8760 hours. HbA1C: No results for input(s): HGBA1C in the last 72 hours. CBG: No results for input(s): GLUCAP in the last 168 hours. Lipid Profile: No results for input(s): CHOL, HDL, LDLCALC, TRIG, CHOLHDL, LDLDIRECT in the last 72 hours. Thyroid Function Tests: No results for input(s): TSH, T4TOTAL, FREET4, T3FREE, THYROIDAB in the last 72 hours. Anemia Panel: No results for input(s): VITAMINB12, FOLATE, FERRITIN, TIBC, IRON, RETICCTPCT in the last 72 hours. Urine analysis:    Component Value Date/Time   COLORURINE AMBER (A) 12/23/2020 0210   APPEARANCEUR CLEAR (A) 12/23/2020 0210   APPEARANCEUR Cloudy 09/15/2013 1213   LABSPEC 1.013 12/23/2020 0210   LABSPEC 1.024 09/15/2013 1213   PHURINE 5.0 12/23/2020 0210   GLUCOSEU 50 (A) 12/23/2020 0210   GLUCOSEU Negative 09/15/2013 1213   HGBUR SMALL (A) 12/23/2020 0210   BILIRUBINUR NEGATIVE 12/23/2020 0210   BILIRUBINUR  Negative 09/15/2013 1213   KETONESUR 80 (A) 12/23/2020 0210   PROTEINUR NEGATIVE 12/23/2020 0210   NITRITE NEGATIVE 12/23/2020 0210   LEUKOCYTESUR NEGATIVE 12/23/2020 0210   LEUKOCYTESUR Negative 09/15/2013 1213   Sepsis Labs: '@LABRCNTIP'$ (procalcitonin:4,lacticidven:4) ) Recent Results (from the past 240 hour(s))  Urine Culture     Status: Abnormal   Collection Time: 12/18/20 10:49 AM   Specimen: Urine, Random  Result Value Ref Range Status   Specimen Description   Final    URINE, RANDOM Performed at [facility], [address], [place], [place] [address]    Special Requests   Final    NONE Performed at [facility], [address] [place], [place] [address]    Culture (A)  Final    <10,000 COLONIES/mL INSIGNIFICANT GROWTH Performed at [facility], [place] [address] [place], [place] [address]    Report Status 12/20/2020 FINAL  Final  Resp Panel by RT-PCR (Flu A&B, Covid) Nasopharyngeal Swab     Status: None   Collection Time: 12/23/20  8:51 AM   Specimen: Nasopharyngeal Swab; Nasopharyngeal(NP) swabs in vial transport medium  Result Value Ref Range Status   SARS Coronavirus 2 by RT PCR NEGATIVE NEGATIVE Final    Comment: (NOTE) SARS-CoV-2 target nucleic acids are NOT DETECTED.  The SARS-CoV-2 RNA is generally detectable in upper respiratory specimens during the acute phase of infection. The lowest concentration of SARS-CoV-2 viral copies this assay can detect is 138 copies/mL. A negative result does not preclude SARS-Cov-2 infection and should not be used as the sole basis for treatment or other patient management decisions. A negative result may occur with  improper specimen collection/handling, submission of specimen other than nasopharyngeal swab, presence of viral mutation(s) within the areas targeted by this assay, and inadequate number of viral copies(<138 copies/mL). A negative result must be combined with clinical observations, patient  history, and epidemiological information. The expected result is Negative.  Fact Sheet for Patients:  EntrepreneurPulse.com.au  Fact Sheet for Healthcare Providers:  IncredibleEmployment.be  This test is no t yet approved or cleared by the [place] FDA and  has been authorized for detection and/or diagnosis of SARS-CoV-2 by FDA  under an Emergency Use Authorization (EUA). This EUA will remain  in effect (meaning this test can be used) for the duration of the COVID-19 declaration under Section 564(b)(1) of the Act, 21 U.S.C.section 360bbb-3(b)(1), unless the authorization is terminated  or revoked sooner.       Influenza A by PCR NEGATIVE NEGATIVE Final   Influenza B by PCR NEGATIVE NEGATIVE Final    Comment: (NOTE) The Xpert Xpress SARS-CoV-2/FLU/RSV plus assay is intended as an aid in the diagnosis of influenza from Nasopharyngeal swab specimens and should not be used as a sole basis for treatment. Nasal washings and aspirates are unacceptable for Xpert Xpress SARS-CoV-2/FLU/RSV testing.  Fact Sheet for Patients: EntrepreneurPulse.com.au  Fact Sheet for Healthcare Providers: IncredibleEmployment.be  This test is not yet approved or cleared by the [place] FDA and has been authorized for detection and/or diagnosis of SARS-CoV-2 by FDA under an Emergency Use Authorization (EUA). This EUA will remain in effect (meaning this test can be used) for the duration of the COVID-19 declaration under Section 564(b)(1) of the Act, 21 U.S.C. section 360bbb-3(b)(1), unless the authorization is terminated or revoked.  Performed at [facility], [address] [place], [place] [address]      Radiological Exams on Admission: CT ABDOMEN PELVIS W CONTRAST  Result Date: 12/23/2020 CLINICAL DATA:  Non localized and worsening abdominal pain with vomiting. EXAM: CT ABDOMEN AND PELVIS WITH CONTRAST  TECHNIQUE: Multidetector CT imaging of the abdomen and pelvis was performed using the standard protocol following bolus administration of intravenous contrast. CONTRAST:  22m OMNIPAQUE IOHEXOL 350 MG/ML SOLN COMPARISON:  12/18/2020. FINDINGS: Lower chest: Unremarkable. Hepatobiliary: No suspicious focal abnormality within the liver parenchyma. 3 mm hypodensity in the posterior right liver (18/2) too small to characterize but most likely benign. There is no evidence for gallstones, gallbladder wall thickening, or pericholecystic fluid. No intrahepatic or extrahepatic biliary dilation. Pancreas: No focal mass lesion. No dilatation of the main duct. No intraparenchymal cyst. No peripancreatic edema. Spleen: No splenomegaly. No focal mass lesion. Adrenals/Urinary Tract: No adrenal nodule or mass. No suspicious renal mass. Mild fullness noted intrarenal collecting system bilaterally in each renal pelvis, progressive in the short interval since prior study. There is mild proximal right hydroureter. The urinary bladder appears normal for the degree of distention. Stomach/Bowel: Stomach is decompressed with probable fundal diverticulum. Duodenum is normally positioned as is the ligament of Treitz. No small bowel wall thickening. No small bowel dilatation. The terminal ileum is normal. The appendix is not well visualized, but there is no edema or inflammation in the region of the cecum. Cecum is markedly distended with stool, progressive in the interval now measuring 7.2 cm diameter there is pericecal edema and trace fluid associated. Prominent stool in the hepatic flexure with gaseous distention of the transverse colon up to 5 cm diameter. Splenic flexure is distended with fluid and stool with prominent stool volume in the proximal sigmoid colon. There is some pericolonic edema associated with the proximal descending colon and there may be some mild wall thickening in the same segment. Distal sigmoid colon and rectum are  decompressed. There is a relatively abrupt transition from distended sigmoid colon filled with formed stool and the decompressed distal sigmoid colon and rectum (image 67/2). Vascular/Lymphatic: There is moderate atherosclerotic calcification of the abdominal aorta without aneurysm. Hazy soft tissue attenuation is seen anterior to the aorta at the level of the IMA origin. The IMA does opacify. There is no gastrohepatic or hepatoduodenal ligament lymphadenopathy. No  retroperitoneal or mesenteric lymphadenopathy. No pelvic sidewall lymphadenopathy. Reproductive: Unremarkable. Other: No intraperitoneal free fluid. Musculoskeletal: No worrisome lytic or sclerotic osseous abnormality. IMPRESSION: 1. Markedly distended colon with stool, progressive in the interval since prior study. There is pericecal edema/fluid associated. Apparent mild wall thickening noted in the proximal descending colon with some adjacent trace edema/fluid. There is a relatively abrupt transition from distended sigmoid colon filled with formed stool and the decompressed distal sigmoid colon and rectum. This appearance can be seen in the setting of enema common but obstruction secondary to distal colonic stricture not excluded. Severe colonic ileus could have this appearance. Given the disproportionate distention of the right colon, Ogilvie syndrome is a consideration. No substantial stool volume in the rectum to suggest impaction/stercoral colitis. 2. Mild fullness noted intrarenal collecting system bilaterally and each renal pelvis, progressive in the short interval since prior study. There is mild proximal right hydroureter down to the level of the distended cecum which appears to displace the ureter laterally and potentially compress it between the cecum and the iliopsoas complex. 3. Hazy soft tissue anterior to the distal aorta involving the IMA origin, nonspecific but potentially related to inflammatory etiology/vasculitis. Appearances similar  to the 12/18/2020 exam. 4. Aortic Atherosclerosis (ICD10-I70.0). Electronically Signed   By: Misty Stanley M.D.   On: 12/23/2020 08:14     EKG: I have personally reviewed.  Sinus rhythm, QTC 457, early R wave progression.   Assessment/Plan Principal Problem:   Abdominal pain Active Problems:   Depression with anxiety   HLD (hyperlipidemia)   Tremors of nervous system   CKD (chronic kidney disease), stage II   Leukocytosis   Hyperglycemia   Abdominal pain: Etiology is not very clear.  CT scan showed  increase in stool burden from previous with cecal dilation to 7 cm and there was some question of possible distal obstructive process vs ileus. Dr. Celine Ahr of general surgeon is consulted, who recommended conservative treatment with enema and laxative. -Admitted to [facility] bed as inpatient -N.p.o. -As needed morphine, Zofran -IV fluid: Total of 1.5 L IV fluid, then 75 cc/h normal saline -Gastrografin enema and sorbitol -MiraLAX, senna code  Depression with anxiety -Continue home medications  HLD (hyperlipidemia) -Crestor  Tremors of nervous system -Primidone  CKD (chronic kidney disease), stage II: Slightly worsening.  Recent baseline creatinine 0.98 on 12/18/2020.  Her creatinine is 1.14, BUN 21, likely due to dehydration -IV fluid as above  Hyperglycemia: Blood sugar 224.  Denies history of diabetes -Check A1c -Sliding scale insulin  Questionable UTI: Patient was started on Keflex on 8/24 in ED. patient denies any symptoms of UTI.  Urinalysis showed trace amount of leukocyte in that visit, today urinalysis negative.  Patient had leukocytosis with WBC 16.7, which improved to 12.8.  No fever.   -Will discontinue antibiotics -Follow-up of blood culture and urine culture    DVT ppx: SCD Code Status: Full code Family Communication: not done, no family member is at bed side.     Disposition Plan:  Anticipate discharge back to previous environment Consults called:  Dr. Celine Ahr  of surgery Admission status and Level of care: Med-Surg:     as inpt     Status is: Inpatient  Remains inpatient appropriate because:Inpatient level of care appropriate due to severity of illness  Dispo: The patient is from: Home              Anticipated d/c is to: Home  Patient currently is not medically stable to d/c.   Difficult to place patient No           Date of Service 12/23/2020    Ivor Costa [facility]   If 7PM-7AM, please contact night-coverage [email or web address] 12/23/2020, 10:59 AM

## 2020-12-23 NOTE — ED Notes (Signed)
Xray called this RN stating that DG BE imaging will be done at 1500

## 2020-12-23 NOTE — ED Provider Notes (Signed)
I assumed care of this patient approximately 0 700.  Please see outgoing providers note for full details regarding patient's initial evaluation assessment.  In brief patient presents with history of couple days of some abdominal pain associate with vomiting and recent bowel movement.  Patient was recently evaluated in the emergency room on 8/24 and had a CT that showed a large stool burden and she was diagnosed with symptomatic constipation discharged with recommendation for laxative therapy.  Work-up in the ED today shows a UA not suggestive of cystitis, CBC with slight leukocytosis of 12.8 down from 16.75 days ago without acute anemia.  CMP shows a glucose of 224 without any other significant electrolyte or metabolic derangements.  Advised to follow-up lactic acid and CT abdomen pelvis.  In the meantime patient will receive IV fluids Zofran and morphine.  CT on pelvis remarkable for clear significant distention of the colon progressively entire study with some pericecal edema and fluid and mild wall thickening in the proximal descending colon some trace edema.  There is also a cut off transition from the distended colon filled with formed stool and a decompressed distal sigmoid colon and rectum.  Possibly secondary to a stricture versus severe ileus versus Ogilvie syndrome.  There is also some mild fullness noted in the intrarenal collecting system bilaterally and progressive since last study.  This is some mild proximal right hydroureter likely secondary to some compression from the cecum.  There is also some nonspecific haziness around the descending aorta at the IMA origin but this is similar to prior study somewhat nonspecific at this time.  Given findings on CT of possible stricture, stricture versus local AV syndrome I discussed with on-call surgeon Dr. Celine Ahr who recommended no emergent surgical evaluation possible admission for bowel cleanout and possible follow-up with a colonoscopy.  Enema and  laxative ordered.  I will give additional 500 cc of fluid.  I will admit to medicine service for further evaluation and management.   Lucrezia Starch, MD 12/23/20 [phone]

## 2020-12-23 NOTE — ED Notes (Signed)
Consulting MD at bedside

## 2020-12-23 NOTE — Plan of Care (Signed)

## 2020-12-23 NOTE — ED Notes (Signed)
Patient transported to X-ray 

## 2020-12-23 NOTE — ED Provider Notes (Signed)
[facility] Emergency Department Provider Note ____________________________________________   Event Date/Time   First MD Initiated Contact with Patient 12/23/20 0559     (approximate)  I have reviewed the triage vital signs and the nursing notes.   HISTORY  Chief Complaint Abdominal Pain    HPI Michelle Walsh is a 65 y.o. female with PMH as noted below who presents with persistent lower abdominal pain over the last several days, associated with vomiting since around 8 PM last night, with about 8 episodes since that time.  The patient states that she previously had been constipated but had a large bowel movement earlier in the evening.  She denies fever or chills.  She has had no blood in the stool or in the vomitus.  Past Medical History:  Diagnosis Date   Anxiety    Chronic pain of right knee    Depression    Osteoarthritis    Restless leg syndrome    Tremors of nervous system    Vitamin D deficiency     Patient Active Problem List   Diagnosis Date Noted   Aortic atherosclerosis (Standard) 11/05/2020   Left shoulder pain 08/14/2020   Bradycardia 12/25/2019   HLD (hyperlipidemia) 12/25/2019   Decreased hearing 10/24/2019   Restless leg syndrome    Osteopenia 07/24/2019   Tremor of left hand 07/24/2019   Uterine prolapse 07/24/2019   Screening for breast cancer 04/19/2019   Routine physical examination 04/19/2019   Cervical dystonia 12/21/2018   Vitamin D deficiency 12/21/2018   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 Authorizing Provider  alendronate (FOSAMAX) 70 MG tablet TAKE 1 TABLET BY MOUTH EVERY 7 (SEVEN) DAYS.  TAKE WITH A FULL GLASS OF WATER ON AN EMPTY STOMACH. 06/24/20   [name], FNP  busPIRone (BUSPAR) 10 MG tablet TAKE 1 TABLET BY MOUTH TWICE A DAY 07/01/20   [name], FNP  cephALEXin (KEFLEX) 500 MG capsule Take 1 capsule (500 mg total) by mouth 4 (four) times daily for 7 days. 12/18/20 12/25/20  Marlana Salvage, PA  diclofenac sodium (VOLTAREN) 1 % GEL as needed.  11/17/18   [provider]  DULoxetine (CYMBALTA) 60 MG capsule Take 1 capsule (60 mg total) by mouth daily. 11/05/20   [name], FNP  fluticasone (FLONASE) 50 MCG/ACT nasal spray Place 2 sprays into both nostrils daily. 10/31/19   [provider]  meloxicam (MOBIC) 7.5 MG tablet TAKE 1 TABLET BY MOUTH DAILY AS NEEDED FOR PAIN 11/06/20   [name], FNP  primidone (MYSOLINE) 50 MG tablet TAKE 1 TABLET (50 MG TOTAL) BY MOUTH IN THE MORNING AND AT BEDTIME. 07/25/20   [name], FNP  rOPINIRole (REQUIP) 1 MG tablet Take 1 tablet (1 mg total) by mouth at bedtime. 11/05/20   [name], FNP  rosuvastatin (CRESTOR) 10 MG tablet TAKE 1 TABLET BY MOUTH EVERY DAY 12/19/20   [name], FNP    Allergies Ciprofloxacin and Codeine  Family History  Problem Relation Age of Onset   Breast cancer Maternal Grandmother 30   Seizures Father  Social History Social History   Tobacco Use   Smoking status: Never   Smokeless tobacco: Never  Vaping Use   Vaping Use: Some days  Substance Use Topics   Alcohol use: Never    Comment: very rare - 1 time per year   Drug use: Yes    Types: Marijuana    Comment: does it thru a vape - uses THC - "helps with anxiety"    Review of Systems  Constitutional: No fever/chills. Eyes: No visual changes. ENT: No sore throat. Cardiovascular: Denies chest pain. Respiratory: Denies shortness of breath. Gastrointestinal: Positive for nausea and vomiting. Genitourinary: Negative for dysuria.  Musculoskeletal: Negative for back  pain. Skin: Negative for rash. Neurological: Negative for headaches, focal weakness or numbness.   ____________________________________________   PHYSICAL EXAM:  VITAL SIGNS: ED Triage Vitals  Enc Vitals Group     BP 12/23/20 0123 (!) 153/74     Pulse Rate 12/23/20 0123 72     Resp 12/23/20 0122 16     Temp 12/23/20 0123 97.8 F (36.6 C)     Temp Source 12/23/20 0122 Oral     SpO2 12/23/20 0123 100 %     Weight 12/23/20 0123 132 lb (59.9 kg)     Height 12/23/20 0123 '5\' 4"'$  (1.626 m)     Head Circumference --      Peak Flow --      Pain Score 12/23/20 0122 10     Pain Loc --      Pain Edu? --      Excl. in [place]? --     Constitutional: Alert and oriented.  Slightly uncomfortable appearing but in no acute distress. Eyes: Conjunctivae are normal.  No scleral icterus. Head: Atraumatic. Nose: No congestion/rhinnorhea. Mouth/Throat: Mucous membranes are moist.   Neck: Normal range of motion.  Cardiovascular: Normal rate, regular rhythm. Good peripheral circulation. Respiratory: Normal respiratory effort.  No retractions.  Gastrointestinal: Soft with moderate bilateral lower quadrant tenderness.  Mild distention.  Genitourinary: No flank tenderness. Musculoskeletal: Extremities warm and well perfused.  Neurologic:  Normal speech and language. No gross focal neurologic deficits are appreciated.  Skin:  Skin is warm and dry. No rash noted. Psychiatric: Mood and affect are normal. Speech and behavior are normal.  ____________________________________________   LABS (all labs ordered are listed, but only abnormal results are displayed)  Labs Reviewed  COMPREHENSIVE METABOLIC PANEL - Abnormal; Notable for the following components:      Result Value   Sodium 133 (*)    Chloride 96 (*)    Glucose, Bld 224 (*)    Creatinine, Ser 1.14 (*)    GFR, Estimated 53 (*)    All other components within normal limits  CBC - Abnormal; Notable for the following components:   WBC 12.8 (*)     All other components within normal limits  URINALYSIS, COMPLETE (UACMP) WITH MICROSCOPIC - Abnormal; Notable for the following components:   Color, Urine AMBER (*)    APPearance CLEAR (*)    Glucose, UA 50 (*)    Hgb urine dipstick SMALL (*)    Ketones, ur 80 (*)    All other components within normal limits  LACTIC ACID, PLASMA  LACTIC ACID, PLASMA   ____________________________________________  EKG   ____________________________________________  RADIOLOGY  CT/pelvis: Pending  ____________________________________________   PROCEDURES  Procedure(s) performed: No  Procedures  Critical Care performed: No ____________________________________________   INITIAL IMPRESSION / ASSESSMENT AND PLAN / ED COURSE  Pertinent labs &  imaging results that were available during my care of the patient were reviewed by me and considered in my medical decision making (see chart for details).   65 year old female with PMH as noted above presents with persistent lower abdominal pain over the last several days as well as nausea and vomiting that started yesterday evening.  I reviewed the past medical records in [place].  The patient was seen in the ED on 8/24 with severe abdominal pain and constipation.  CT at that time showed a large amount of stool throughout the colon and possible enteritis but no obstruction or other acute abnormality.  The patient was discharged with Colace, Senokot, and Metamucil.  She states that she did have a large bowel movement yesterday evening and feels like things are clearing up.  On exam currently, the patient is uncomfortable appearing but in no acute distress.  Her vital signs are normal except for mild hypertension.  She does have tenderness in bilateral lower quadrants.  Exam is otherwise unremarkable.  Given the negative CT few days ago, I suspect that the pain and vomiting are related to ongoing constipation and/or enteritis.  However, differential also  includes diverticulitis, colitis, ischemic bowel, SBO, volvulus, UTI/pyelonephritis, or less likely hepatobiliary cause.  Initial lab work-up obtained from triage is unremarkable except for leukocytosis although this has improved since last visit.  We will obtain a CT, give analgesia and antiemetic, fluids, and reassess.  ----------------------------------------- 7:18 AM on 12/23/2020 -----------------------------------------  He is pending.  I signed the patient out to the oncoming ED physician Dr. Tamala Julian.  ____________________________________________   FINAL CLINICAL IMPRESSION(S) / ED DIAGNOSES  Final diagnoses:  Lower abdominal pain      NEW MEDICATIONS STARTED DURING THIS VISIT:  New Prescriptions   No medications on file     Note:  This document was prepared using Dragon voice recognition software and may include unintentional dictation errors.    Arta Silence, MD 12/23/20 [phone]

## 2020-12-23 NOTE — ED Triage Notes (Signed)
Pt with vomiting, abd pain for three hours. Pt denies diarrhea. Pt complains of lower abd pain.

## 2020-12-24 LAB — GLUCOSE, CAPILLARY
Glucose-Capillary: 100 mg/dL — ABNORMAL HIGH (ref 70–99)
Glucose-Capillary: 112 mg/dL — ABNORMAL HIGH (ref 70–99)
Glucose-Capillary: 120 mg/dL — ABNORMAL HIGH (ref 70–99)
Glucose-Capillary: 90 mg/dL (ref 70–99)
Glucose-Capillary: 94 mg/dL (ref 70–99)
Glucose-Capillary: 99 mg/dL (ref 70–99)

## 2020-12-24 LAB — BASIC METABOLIC PANEL
Anion gap: 8 (ref 5–15)
BUN: 16 mg/dL (ref 8–23)
CO2: 27 mmol/L (ref 22–32)
Calcium: 7.9 mg/dL — ABNORMAL LOW (ref 8.9–10.3)
Chloride: 101 mmol/L (ref 98–111)
Creatinine, Ser: 0.83 mg/dL (ref 0.44–1.00)
GFR, Estimated: >60 mL/min (ref >60)
Glucose, Bld: 92 mg/dL (ref 70–99)
Potassium: 3.1 mmol/L — ABNORMAL LOW (ref 3.5–5.1)
Sodium: 136 mmol/L (ref 135–145)

## 2020-12-24 LAB — CBC
HCT: 32.3 % — ABNORMAL LOW (ref 36.0–46.0)
Hemoglobin: 10.5 g/dL — ABNORMAL LOW (ref 12.0–15.0)
MCH: 30 pg (ref 26.0–34.0)
MCHC: 32.5 g/dL (ref 30.0–36.0)
MCV: 92.3 fL (ref 80.0–100.0)
Platelets: 291 10*3/uL (ref 150–400)
RBC: 3.5 MIL/uL — ABNORMAL LOW (ref 3.87–5.11)
RDW: 13.6 % (ref 11.5–15.5)
WBC: 16 10*3/uL — ABNORMAL HIGH (ref 4.0–10.5)
nRBC: 0 % (ref 0.0–0.2)

## 2020-12-24 LAB — URINE CULTURE: Culture: NO GROWTH

## 2020-12-24 LAB — HIV ANTIBODY (ROUTINE TESTING W REFLEX): HIV Screen 4th Generation wRfx: NONREACTIVE

## 2020-12-24 MED ORDER — DOCUSATE SODIUM 100 MG PO CAPS
100.0000 mg | ORAL_CAPSULE | Freq: Every day | ORAL | Status: DC
  Administered 2020-12-24 – 2020-12-26 (×3): 100 mg via ORAL
  Filled 2020-12-24 (×3): qty 1

## 2020-12-24 NOTE — Progress Notes (Signed)
PROGRESS NOTE    Michelle Walsh  [id number] DOB: July 04, 1955 DOA: 12/23/2020 PCP: [name], FNP   Brief Narrative:  Michelle Walsh is a 65 y.o. female with medical history significant of hyperlipidemia, depression with anxiety, RLS, tremor, cerebellar ataxia, uterine prolapse, CKD-2, who presents with abdominal pain, abdominal distention, constipation, nausea, vomiting. Patient has been having abdominal pain and abdominal distention for more than 8 days with non-bloody emesis. 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  Assessment & Plan:   Principal Problem:   Abdominal pain Active Problems:   Depression with anxiety   HLD (hyperlipidemia)   Tremors of nervous system   CKD (chronic kidney disease), stage II   Hyperglycemia   UTI (urinary tract infection)   Intractable abdominal pain, POA Concurrent nausea vomiting, rule out small bowel obstruction -Unclear etiology, patient indicates profound constipation and obstipation over the last week  -Surgery consulted given questionable obstructive process noted on CT -Continue supportive care, reported bowel movement overnight per patient, unclear if this is postobstructive or true resolution of ileus/obstructive process -Continue to follow bowel movement, repeat imaging per surgery  Depression with anxiety -Continue home medications   HLD (hyperlipidemia) -Crestor   Essential tremors of nervous system -Primidone ongoing   AKI on CKD (chronic kidney disease), stage II, POA -Continue IV fluids, likely provoked given poor p.o. intake in the setting of above  -Follow morning labs    Hyperglycemia:  - A1c 5.7, likely acute phase reactant, follow repeat labs   Questionable UTI, POA:  -Completed antibiotic course with Keflex started 12/18/2020  -No further symptoms, follow clinically  -Cultures pending    DVT ppx: SCD, early ambulation Code Status: Full code.      Family Communication: None present  Status is: Inpatient  Dispo: The patient is from: Home              Anticipated d/c is to: Same              Anticipated d/c date is: 24 to 48 hours              Patient currently not medically stable for discharge  Consultants:  General surgery  Procedures:  None  Antimicrobials:  None  Subjective: No acute issues or events overnight, reported bowel movement overnight somewhat small, reports improvement in nausea vomiting abdominal pain.  Otherwise denies chest pain shortness of breath headache fevers chills  Objective: Vitals:   12/23/20 1800 12/23/20 1900 12/23/20 2028 12/24/20 0425  BP: 134/65 (!) 99/59 120/61 (!) 102/55  Pulse: 83 79 82 81  Resp: 16   15  Temp:  97.9 F (36.6 C) 98.4 F (36.9 C) 98.7 F (37.1 C)  TempSrc:  Oral Oral   SpO2: 97% 94% 99%   Weight:      Height:        Intake/Output Summary (Last 24 hours) at 12/24/2020 0801 Last data filed at 12/23/2020 [id number] Gross per 24 hour  Intake 1499 ml  Output --  Net 1499 ml   Filed Weights   12/23/20 0123  Weight: 59.9 kg    Examination:  General:  Pleasantly resting in bed, No acute distress. HEENT:  Normocephalic atraumatic.  Sclerae nonicteric, noninjected.  Extraocular movements intact bilaterally. Neck:  Without mass or deformity.  Trachea is midline. Lungs:  Clear to auscultate bilaterally without rhonchi, wheeze, or rales. Heart:  Regular rate and rhythm.  Without murmurs, rubs, or gallops. Abdomen:  Soft, nontender, minimally distended.  Without guarding or rebound. Extremities: Without cyanosis, clubbing, edema, or obvious deformity. Vascular:  Dorsalis pedis and posterior tibial pulses palpable bilaterally. Skin:  Warm and dry, no erythema, no ulcerations.   Data Reviewed: I have personally reviewed following labs and imaging studies  CBC: Recent Labs  Lab 12/18/20 1049 12/23/20 0126 12/24/20 0419  WBC 16.7*  17.1* 12.8* 16.0*  NEUTROABS  13.3*  --   --   HGB 12.1  12.1 12.9 10.5*  HCT 37.1  37.6 39.3 32.3*  MCV 94.2  94.2 90.8 92.3  PLT 305  306 360 [id number]   Basic Metabolic Panel: Recent Labs  Lab 12/18/20 1049 12/23/20 0126 12/24/20 0419  NA 137 133* 136  K 3.9 3.5 3.1*  CL 97* 96* 101  CO2 '29 25 27  '$ GLUCOSE 109* 224* 92  BUN '19 21 16  '$ CREATININE 0.98 1.14* 0.83  CALCIUM 9.0 9.2 7.9*   GFR: Estimated Creatinine Clearance: 56.8 mL/min (by C-G formula based on SCr of 0.83 mg/dL). Liver Function Tests: Recent Labs  Lab 12/18/20 1049 12/23/20 0126  AST 18 21  ALT 15 16  ALKPHOS 54 56  BILITOT 0.9 1.0  PROT 7.4 7.4  ALBUMIN 3.9 4.0   Recent Labs  Lab 12/18/20 1049  LIPASE 29   No results for input(s): AMMONIA in the last 168 hours. Coagulation Profile: Recent Labs  Lab 12/23/20 0952  INR 1.1   Cardiac Enzymes: No results for input(s): CKTOTAL, CKMB, CKMBINDEX, TROPONINI in the last 168 hours. BNP (last 3 results) No results for input(s): PROBNP in the last 8760 hours. HbA1C: Recent Labs    12/23/20 0126  HGBA1C 5.7*   CBG: Recent Labs  Lab 12/23/20 1426 12/23/20 1735 12/23/20 2032 12/24/20 0019 12/24/20 0604  GLUCAP 131* 123* 106* 112* 90   Lipid Profile: No results for input(s): CHOL, HDL, LDLCALC, TRIG, CHOLHDL, LDLDIRECT in the last 72 hours. Thyroid Function Tests: No results for input(s): TSH, T4TOTAL, FREET4, T3FREE, THYROIDAB in the last 72 hours. Anemia Panel: No results for input(s): VITAMINB12, FOLATE, FERRITIN, TIBC, IRON, RETICCTPCT in the last 72 hours. Sepsis Labs: Recent Labs  Lab 12/23/20 0126 12/23/20 0812  PROCALCITON <0.10  --   LATICACIDVEN  --  1.4    Recent Results (from the past 240 hour(s))  Urine Culture     Status: Abnormal   Collection Time: 12/18/20 10:49 AM   Specimen: Urine, Random  Result Value Ref Range Status   Specimen Description   Final    URINE, RANDOM Performed at [facility], [address] [place], [place]  [address]    Special Requests   Final    NONE Performed at [facility], [address], [place], [place] [address]    Culture (A)  Final    <10,000 COLONIES/mL INSIGNIFICANT GROWTH Performed at [facility], [place] [address], [place], [place] [address]    Report Status 12/20/2020 FINAL  Final  Resp Panel by RT-PCR (Flu A&B, Covid) Nasopharyngeal Swab     Status: None   Collection Time: 12/23/20  8:51 AM   Specimen: Nasopharyngeal Swab; Nasopharyngeal(NP) swabs in vial transport medium  Result Value Ref Range Status   SARS Coronavirus 2 by RT PCR NEGATIVE NEGATIVE Final    Comment: (NOTE) SARS-CoV-2 target nucleic acids are NOT DETECTED.  The SARS-CoV-2 RNA is generally detectable in upper respiratory specimens during the acute phase of infection. The lowest concentration of SARS-CoV-2 viral copies this assay  can detect is 138 copies/mL. A negative result does not preclude SARS-Cov-2 infection and should not be used as the sole basis for treatment or other patient management decisions. A negative result may occur with  improper specimen collection/handling, submission of specimen other than nasopharyngeal swab, presence of viral mutation(s) within the areas targeted by this assay, and inadequate number of viral copies(<138 copies/mL). A negative result must be combined with clinical observations, patient history, and epidemiological information. The expected result is Negative.  Fact Sheet for Patients:  EntrepreneurPulse.com.au  Fact Sheet for Healthcare Providers:  IncredibleEmployment.be  This test is no t yet approved or cleared by the [place] FDA and  has been authorized for detection and/or diagnosis of SARS-CoV-2 by FDA under an Emergency Use Authorization (EUA). This EUA will remain  in effect (meaning this test can be used) for the duration of the COVID-19 declaration under Section 564(b)(1) of the Act,  21 U.S.C.section 360bbb-3(b)(1), unless the authorization is terminated  or revoked sooner.       Influenza A by PCR NEGATIVE NEGATIVE Final   Influenza B by PCR NEGATIVE NEGATIVE Final    Comment: (NOTE) The Xpert Xpress SARS-CoV-2/FLU/RSV plus assay is intended as an aid in the diagnosis of influenza from Nasopharyngeal swab specimens and should not be used as a sole basis for treatment. Nasal washings and aspirates are unacceptable for Xpert Xpress SARS-CoV-2/FLU/RSV testing.  Fact Sheet for Patients: EntrepreneurPulse.com.au  Fact Sheet for Healthcare Providers: IncredibleEmployment.be  This test is not yet approved or cleared by the [place] FDA and has been authorized for detection and/or diagnosis of SARS-CoV-2 by FDA under an Emergency Use Authorization (EUA). This EUA will remain in effect (meaning this test can be used) for the duration of the COVID-19 declaration under Section 564(b)(1) of the Act, 21 U.S.C. section 360bbb-3(b)(1), unless the authorization is terminated or revoked.  Performed at [facility], [address] [place], [place] [address]   Culture, blood (x 2)     Status: None (Preliminary result)   Collection Time: 12/23/20  9:52 AM   Specimen: BLOOD  Result Value Ref Range Status   Specimen Description BLOOD LEFT AC  Final   Special Requests   Final    BOTTLES DRAWN AEROBIC AND ANAEROBIC Blood Culture results may not be optimal due to an excessive volume of blood received in culture bottles   Culture   Final    NO GROWTH < 24 HOURS Performed at [facility], [address], [place], [place] [address]    Report Status PENDING  Incomplete  Culture, blood (x 2)     Status: None (Preliminary result)   Collection Time: 12/23/20  9:52 AM   Specimen: BLOOD  Result Value Ref Range Status   Specimen Description BLOOD RIGHT AC  Final   Special Requests   Final    BOTTLES DRAWN AEROBIC AND  ANAEROBIC Blood Culture results may not be optimal due to an excessive volume of blood received in culture bottles Performed at [facility], [address] [place], [place] [phone]    Culture PENDING  Incomplete   Report Status PENDING  Incomplete         Radiology Studies: CT ABDOMEN PELVIS W CONTRAST  Result Date: 12/23/2020 CLINICAL DATA:  Non localized and worsening abdominal pain with vomiting. EXAM: CT ABDOMEN AND PELVIS WITH CONTRAST TECHNIQUE: Multidetector CT imaging of the abdomen and pelvis was performed using the standard protocol following bolus administration of intravenous contrast.  CONTRAST:  55m OMNIPAQUE IOHEXOL 350 MG/ML SOLN COMPARISON:  12/18/2020. FINDINGS: Lower chest: Unremarkable. Hepatobiliary: No suspicious focal abnormality within the liver parenchyma. 3 mm hypodensity in the posterior right liver (18/2) too small to characterize but most likely benign. There is no evidence for gallstones, gallbladder wall thickening, or pericholecystic fluid. No intrahepatic or extrahepatic biliary dilation. Pancreas: No focal mass lesion. No dilatation of the main duct. No intraparenchymal cyst. No peripancreatic edema. Spleen: No splenomegaly. No focal mass lesion. Adrenals/Urinary Tract: No adrenal nodule or mass. No suspicious renal mass. Mild fullness noted intrarenal collecting system bilaterally in each renal pelvis, progressive in the short interval since prior study. There is mild proximal right hydroureter. The urinary bladder appears normal for the degree of distention. Stomach/Bowel: Stomach is decompressed with probable fundal diverticulum. Duodenum is normally positioned as is the ligament of Treitz. No small bowel wall thickening. No small bowel dilatation. The terminal ileum is normal. The appendix is not well visualized, but there is no edema or inflammation in the region of the cecum. Cecum is markedly distended with stool, progressive in the interval now  measuring 7.2 cm diameter there is pericecal edema and trace fluid associated. Prominent stool in the hepatic flexure with gaseous distention of the transverse colon up to 5 cm diameter. Splenic flexure is distended with fluid and stool with prominent stool volume in the proximal sigmoid colon. There is some pericolonic edema associated with the proximal descending colon and there may be some mild wall thickening in the same segment. Distal sigmoid colon and rectum are decompressed. There is a relatively abrupt transition from distended sigmoid colon filled with formed stool and the decompressed distal sigmoid colon and rectum (image 67/2). Vascular/Lymphatic: There is moderate atherosclerotic calcification of the abdominal aorta without aneurysm. Hazy soft tissue attenuation is seen anterior to the aorta at the level of the IMA origin. The IMA does opacify. There is no gastrohepatic or hepatoduodenal ligament lymphadenopathy. No retroperitoneal or mesenteric lymphadenopathy. No pelvic sidewall lymphadenopathy. Reproductive: Unremarkable. Other: No intraperitoneal free fluid. Musculoskeletal: No worrisome lytic or sclerotic osseous abnormality. IMPRESSION: 1. Markedly distended colon with stool, progressive in the interval since prior study. There is pericecal edema/fluid associated. Apparent mild wall thickening noted in the proximal descending colon with some adjacent trace edema/fluid. There is a relatively abrupt transition from distended sigmoid colon filled with formed stool and the decompressed distal sigmoid colon and rectum. This appearance can be seen in the setting of enema common but obstruction secondary to distal colonic stricture not excluded. Severe colonic ileus could have this appearance. Given the disproportionate distention of the right colon, Ogilvie syndrome is a consideration. No substantial stool volume in the rectum to suggest impaction/stercoral colitis. 2. Mild fullness noted intrarenal  collecting system bilaterally and each renal pelvis, progressive in the short interval since prior study. There is mild proximal right hydroureter down to the level of the distended cecum which appears to displace the ureter laterally and potentially compress it between the cecum and the iliopsoas complex. 3. Hazy soft tissue anterior to the distal aorta involving the IMA origin, nonspecific but potentially related to inflammatory etiology/vasculitis. Appearances similar to the 12/18/2020 exam. 4. Aortic Atherosclerosis (ICD10-I70.0). Electronically Signed   By: EMisty StanleyM.D.   On: 12/23/2020 08:14   DG BE (COLON)W SINGLE CM (SOL OR THIN BA)  Result Date: 12/23/2020 CLINICAL DATA:  Constipation EXAM: WATER SOLUBLE CONTRAST ENEMA TECHNIQUE: Single contrast barium enema was performed utilizing water-soluble contrast. FLUOROSCOPY TIME:  Fluoroscopy  Time:  2 minutes 36 seconds Radiation Exposure Index (if provided by the fluoroscopic device): 48.5 mGy Number of Acquired Spot Images: 0 COMPARISON:  CT abdomen pelvis 12/23/2020 FINDINGS: Scout radiograph demonstrates dilated segments of colon. Retained contrast material within the renal collecting system. The patient was positioned left lateral decubitus position. A rectal tube was inserted and balloon insufflated. There was a poor seal with this tube, and contrast only progressed to the distal sigmoid colon. The balloon was then deflated and a new rectal tube and balloon device was utilized. This provided a greater seal. Contrast was then instilled via gravity through the new rectal tube, which progressed to the proximal transverse colon. The transverse colon appear dilated. The contrast was then evacuated via gravity, with minimal residual contrast in the rectosigmoid colon. The procedure was highly therapeutic, with a large volume of stool evacuated. Approximately 500 mL of water-soluble contrast was administered. IMPRESSION: Dilated colon with multiple  filling defects compatible with fecal material. Contrast passed to the level of the proximal transverse colon. The procedure was successful in evacuating a large volume of stool from the colon. No evidence of distal colonic stricture or point of obstruction. Electronically Signed   By: Maurine Simmering M.D.   On: 12/23/2020 17:12     Scheduled Meds:  busPIRone  10 mg Oral BID   diatrizoate meglumine-sodium  480 mL Per Tube Once   DULoxetine  60 mg Oral Daily   insulin aspart  0-15 Units Subcutaneous Q4H   polyethylene glycol  17 g Oral BID   primidone  50 mg Oral BID   rOPINIRole  1 mg Oral QHS   rosuvastatin  10 mg Oral Daily   senna-docusate  1 tablet Oral BID   Continuous Infusions:  sodium chloride 75 mL/hr at 12/23/20 0931    LOS: 1 day   Time spent: 60mn  Ralph Brouwer C Darnita Woodrum, DO [facility]  If 7PM-7AM, please contact night-coverage [email or web address]  12/24/2020, 8:01 AM

## 2020-12-24 NOTE — Progress Notes (Addendum)
[facility] SURGICAL PROGRESS NOTE (cpt [phone])  Hospital Day(s): 1.   Interval History: Patient seen and examined, no acute events or new complaints overnight. Patient reports she is feeling much better this morning compared to presentation yesterday. Her abdominal pain and distension have resolved. No fever, chills, nausea, emesis. She had multiple large bowel movements with gastrografin enema yesterday. She continues to pass flatus. She did have a slight bump in leukocytosis to 16.0K but this is similar to earlier in the week. Renal function remains normal with sCr - 0.83. Mild hypokalemia to 3.1. No other complaints this morning.   Review of Systems:  Constitutional: denies fever, chills  HEENT: denies cough or congestion  Respiratory: denies any shortness of breath  Cardiovascular: denies chest pain or palpitations  Gastrointestinal: denies abdominal pain, N/V, or diarrhea/and bowel function as per interval history Genitourinary: denies burning with urination or urinary frequency  Vital signs in last 24 hours: [min-max] current  Temp:  [97.9 F (36.6 C)-98.7 F (37.1 C)] 98.7 F (37.1 C) (08/30 0425) Pulse Rate:  [73-99] 81 (08/30 0425) Resp:  [15-23] 15 (08/30 0425) BP: (99-144)/(55-80) 102/55 (08/30 0425) SpO2:  [94 %-99 %] 99 % (08/29 2028)     Height: '5\' 4"'$  (162.6 cm) Weight: 59.9 kg BMI (Calculated): 22.65   Intake/Output last 2 shifts:  08/29 0701 - 08/30 0700 In: [place] [IV Piggyback:1499] Out: -    Physical Exam:  Constitutional: alert, cooperative and no distress  HENT: normocephalic without obvious abnormality  Eyes: PERRL, EOM's grossly intact and symmetric  Respiratory: breathing non-labored at rest  Cardiovascular: regular rate and sinus rhythm  Gastrointestinal: Soft, non-tender, distension is resolved, no rebound/guarding Musculoskeletal: no edema or wounds, motor and sensation grossly intact, NT    Labs:  CBC Latest Ref Rng & Units 12/24/2020  12/23/2020 12/18/2020  WBC 4.0 - 10.5 K/uL 16.0(H) 12.8(H) 16.7(H)  Hemoglobin 12.0 - 15.0 g/dL 10.5(L) 12.9 12.1  Hematocrit 36.0 - 46.0 % 32.3(L) 39.3 37.1  Platelets 150 - 400 K/uL 291 360 305   CMP Latest Ref Rng & Units 12/24/2020 12/23/2020 12/18/2020  Glucose 70 - 99 mg/dL 92 224(H) 109(H)  BUN 8 - 23 mg/dL '16 21 19  '$ Creatinine 0.44 - 1.00 mg/dL 0.83 1.14(H) 0.98  Sodium 135 - 145 mmol/L 136 133(L) 137  Potassium 3.5 - 5.1 mmol/L 3.1(L) 3.5 3.9  Chloride 98 - 111 mmol/L 101 96(L) 97(L)  CO2 22 - 32 mmol/L '27 25 29  '$ Calcium 8.9 - 10.3 mg/dL 7.9(L) 9.2 9.0  Total Protein 6.5 - 8.1 g/dL - 7.4 7.4  Total Bilirubin 0.3 - 1.2 mg/dL - 1.0 0.9  Alkaline Phos 38 - 126 U/L - 56 54  AST 15 - 41 U/L - 21 18  ALT 0 - 44 U/L - 16 15     Imaging studies: No new pertinent imaging studies   Assessment/Plan: (ICD-10's: K59.00) 65 y.o. female with, now clinically resolving, severe symptomatic constipation with dilated cecum which appeared to potentially be compressing distal sigmoid colon   - Given her significant improvement in last 24 hours, will initiate CLD; okay to ADAT   - Continue aggressive PO bowel regimen - No emergent surgical intervention warranted - Monitor abdominal examination; on-going bowel function   - Pain control prn (avoid narcotics if feasible); antiemetics prn              - Mobilization as toerlated             - Further  management per primary service; no further general surgery issues at the moment, we will sign of but be available should need arise  All of the above findings and recommendations were discussed with the patient, and the medical team, and all of patient's questions were answered to her expressed satisfaction.  -- Edison Simon, PA-C [facility] 12/24/2020, 7:24 AM [phone] M-F: 7am - 4pm  I saw and evaluated the patient.  I agree with the above documentation, exam, and plan, which I have edited where appropriate. Fredirick Maudlin  1:27 PM

## 2020-12-24 NOTE — Progress Notes (Signed)
Mobility Specialist - Progress Note   12/24/20 1300  Mobility  Activity Ambulated in hall  Level of Assistance Independent  Assistive Device None  Distance Ambulated (ft) 360 ft  Mobility Ambulated independently in hallway  Mobility Response Tolerated well  Mobility performed by Mobility specialist  $Mobility charge 1 Mobility    Ambulated in hallway independently, no complaints. 78 HR, 98% O2 RA.    Kathee Delton Mobility Specialist 12/24/20, 2:01 PM

## 2020-12-25 LAB — BASIC METABOLIC PANEL
Anion gap: 8 (ref 5–15)
BUN: 11 mg/dL (ref 8–23)
CO2: 27 mmol/L (ref 22–32)
Calcium: 8.3 mg/dL — ABNORMAL LOW (ref 8.9–10.3)
Chloride: 106 mmol/L (ref 98–111)
Creatinine, Ser: 0.86 mg/dL (ref 0.44–1.00)
GFR, Estimated: >60 mL/min (ref >60)
Glucose, Bld: 94 mg/dL (ref 70–99)
Potassium: 3.6 mmol/L (ref 3.5–5.1)
Sodium: 141 mmol/L (ref 135–145)

## 2020-12-25 LAB — CBC
HCT: 34.3 % — ABNORMAL LOW (ref 36.0–46.0)
Hemoglobin: 11.2 g/dL — ABNORMAL LOW (ref 12.0–15.0)
MCH: 30.4 pg (ref 26.0–34.0)
MCHC: 32.7 g/dL (ref 30.0–36.0)
MCV: 93.2 fL (ref 80.0–100.0)
Platelets: 323 10*3/uL (ref 150–400)
RBC: 3.68 MIL/uL — ABNORMAL LOW (ref 3.87–5.11)
RDW: 13.3 % (ref 11.5–15.5)
WBC: 10.7 10*3/uL — ABNORMAL HIGH (ref 4.0–10.5)
nRBC: 0 % (ref 0.0–0.2)

## 2020-12-25 LAB — GLUCOSE, CAPILLARY
Glucose-Capillary: 94 mg/dL (ref 70–99)
Glucose-Capillary: 91 mg/dL (ref 70–99)
Glucose-Capillary: 92 mg/dL (ref 70–99)
Glucose-Capillary: 89 mg/dL (ref 70–99)
Glucose-Capillary: 102 mg/dL — ABNORMAL HIGH (ref 70–99)

## 2020-12-25 MED ORDER — INSULIN ASPART 100 UNIT/ML IJ SOLN: 0.0000 [IU] | Freq: Three times a day (TID) | INTRAMUSCULAR | Status: DC

## 2020-12-25 MED ORDER — BISACODYL 10 MG RE SUPP
10.0000 mg | Freq: Once | RECTAL | Status: AC
  Administered 2020-12-25: 10 mg via RECTAL
  Filled 2020-12-25: qty 1

## 2020-12-25 MED ORDER — ENOXAPARIN SODIUM 40 MG/0.4ML IJ SOSY
40.0000 mg | PREFILLED_SYRINGE | INTRAMUSCULAR | Status: DC
  Administered 2020-12-25: 40 mg via SUBCUTANEOUS
  Filled 2020-12-25: qty 0.4

## 2020-12-25 MED ORDER — BISACODYL 5 MG PO TBEC
10.0000 mg | DELAYED_RELEASE_TABLET | Freq: Two times a day (BID) | ORAL | Status: DC
  Administered 2020-12-25 – 2020-12-26 (×3): 10 mg via ORAL
  Filled 2020-12-25 (×3): qty 2

## 2020-12-25 NOTE — Progress Notes (Signed)
[facility] Progress Note  Patient: Michelle Walsh    [id number]  DOA: 12/23/2020     Date of Service: the patient was seen and examined on 12/25/2020  Chief Complaint  Patient presents with   Abdominal Pain   Brief hospital course: Michelle Walsh is a 65 y.o. female with medical history significant of hyperlipidemia, depression with anxiety, RLS, tremor, cerebellar ataxia, uterine prolapse, CKD-2, who presents with abdominal pain, abdominal distention, constipation, nausea, vomiting. Patient has been having abdominal pain and abdominal distention for more than 8 days with non-bloody emesis. 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   Assessment and Plan: Principal Problem:   Abdominal pain Active Problems:   Depression with anxiety   HLD (hyperlipidemia)   Tremors of nervous system   CKD (chronic kidney disease), stage II   Hyperglycemia   UTI (urinary tract infection)     Intractable abdominal pain, POA Concurrent nausea vomiting, rule out small bowel obstruction -Unclear etiology, patient indicates profound constipation and obstipation over the last week  -Surgery consulted given questionable obstructive process noted on CT -Continue supportive care, reported bowel movement overnight per patient, unclear if this is postobstructive or true resolution of ileus/obstructive process -Continue to follow bowel movement, repeat imaging per surgery 8/30 S/p contrast enema yesterday, patient had some mucus but no actual BM as per patient Dulcolax suppository x1 dose ordered   Depression with anxiety -Continue home medications   HLD (hyperlipidemia) -Crestor   Essential tremors of nervous system -Primidone ongoing   AKI on CKD (chronic kidney disease), stage II, POA -Continue IV fluids, likely provoked given poor p.o. intake in the setting of above  -Follow morning labs    Hyperglycemia:  - A1c 5.7, likely  acute phase reactant, follow repeat labs   Questionable UTI, POA:  -Completed antibiotic course with Keflex started 12/18/2020  -No further symptoms, follow clinically  -Cultures pending   Body mass index is 22.66 kg/m.  Interventions:       Diet: GI soft DVT Prophylaxis: Subcutaneous Lovenox   Advance goals of care discussion: Full code  Family Communication: family was present at bedside, at the time of interview.  The pt provided permission to discuss medical plan with the family. Opportunity was given to ask question and all questions were answered satisfactorily.   Disposition:  Pt is from , admitted with abdominal pain possible small bowel obstruction, still patient is not moving bowels, which precludes a safe discharge. Discharge to home, when abdominal pain resolves, patient will be moving bowels, diet need to be advanced and need clearance by general surgery.  Subjective: No significant overnight events, patient is passing gas a little bit, no BM today, patient has some abdominal soreness, no nausea vomiting.  Patient denies any chest pain or shortness of breath.  Physical Exam: General:  alert oriented to time, place, and person.  Appear in mild distress, affect appropriate Eyes: PERRLAs ENT: Oral Mucosa Clear, moist  Neck: no JVD,  Cardiovascular: S1 and S2 Present, no Murmur,  Respiratory: good respiratory effort, Bilateral Air entry equal and Decreased, no Crackles, no wheezes Abdomen: Bowel Sound present, Soft and mild tenderness,  Skin: no rashes Extremities: no Pedal edema, no calf tenderness Neurologic: without any new focal findings Gait not checked due to patient safety concerns  Vitals:   12/24/20 1521 12/24/20 2021 12/25/20 0458 12/25/20 0732  BP: (!) 109/54 130/62 129/63 114/70  Pulse: 89 94 70 70  Resp:  $'16 16 16  'k$ Temp: 98.7 F (37.1 C) 98.8 F (37.1 C) 98.1 F (36.7 C) 98.4 F (36.9 C)  TempSrc: Oral  Oral Oral  SpO2: 98% 98% 97% 98%   Weight:      Height:        Intake/Output Summary (Last 24 hours) at 12/25/2020 1519 Last data filed at 12/25/2020 0548 Gross per 24 hour  Intake 1796.71 ml  Output --  Net 1796.71 ml   Filed Weights   12/23/20 0123  Weight: 59.9 kg    Data Reviewed: I have personally reviewed and interpreted daily labs, tele strips, imagings as discussed above. I reviewed all nursing notes, pharmacy notes, vitals, pertinent old records I have discussed plan of care as described above with RN and patient/family.  CBC: Recent Labs  Lab 12/23/20 0126 12/24/20 0419 12/25/20 0716  WBC 12.8* 16.0* 10.7*  HGB 12.9 10.5* 11.2*  HCT 39.3 32.3* 34.3*  MCV 90.8 92.3 93.2  PLT 360 291 [id number]   Basic Metabolic Panel: Recent Labs  Lab 12/23/20 0126 12/24/20 0419 12/25/20 0716  NA 133* 136 141  K 3.5 3.1* 3.6  CL 96* 101 106  CO2 '25 27 27  '$ GLUCOSE 224* 92 94  BUN '21 16 11  '$ CREATININE 1.14* 0.83 0.86  CALCIUM 9.2 7.9* 8.3*    Studies: No results found.  Scheduled Meds:  bisacodyl  10 mg Oral BID   busPIRone  10 mg Oral BID   diatrizoate meglumine-sodium  480 mL Per Tube Once   docusate sodium  100 mg Oral Daily   DULoxetine  60 mg Oral Daily   enoxaparin (LOVENOX) injection  40 mg Subcutaneous Q24H   insulin aspart  0-15 Units Subcutaneous Q4H   polyethylene glycol  17 g Oral BID   primidone  50 mg Oral BID   rOPINIRole  1 mg Oral QHS   rosuvastatin  10 mg Oral Daily   Continuous Infusions:  sodium chloride 75 mL/hr at 12/25/20 1347   PRN Meds: acetaminophen, fluticasone, morphine injection, ondansetron (ZOFRAN) IV  Time spent: 35 minutes  Author: Val Riles. MD [facility] 12/25/2020 3:19 PM  To reach On-call, see care teams to locate the attending and reach out to them via [email or web address]. If 7PM-7AM, please contact night-coverage If you still have difficulty reaching the attending provider, please page the [facility] (Director on Call) for [facility] on amion for  assistance.

## 2020-12-25 NOTE — Progress Notes (Signed)
Mobility Specialist - Progress Note   12/25/20 1400  Mobility  Activity Ambulated in hall  Level of Assistance Independent  Assistive Device None  Distance Ambulated (ft) 500 ft  Mobility Ambulated independently in hallway  Mobility Response Tolerated well  Mobility performed by Mobility specialist  $Mobility charge 1 Mobility    Pt ambulated in hallway independently, no complaints.    Kathee Delton Mobility Specialist 12/25/20, 2:35 PM

## 2020-12-26 LAB — GLUCOSE, CAPILLARY
Glucose-Capillary: 83 mg/dL (ref 70–99)
Glucose-Capillary: 88 mg/dL (ref 70–99)

## 2020-12-26 MED ORDER — POLYETHYLENE GLYCOL 3350 17 G PO PACK: 17.0000 g | PACK | Freq: Every day | ORAL | 2 refills | Status: AC

## 2020-12-26 MED ORDER — BISACODYL 5 MG PO TBEC: 10.0000 mg | DELAYED_RELEASE_TABLET | Freq: Every evening | ORAL | 0 refills | Status: DC | PRN

## 2020-12-26 NOTE — Discharge Summary (Signed)
[facility] Discharge Summary   Patient: Michelle Walsh [id number]  PCP: [name], FNP  Date of admission: 12/23/2020   Date of discharge:  12/26/2020     Discharge Diagnoses:  Principal Problem:   Abdominal pain Active Problems:   Depression with anxiety   HLD (hyperlipidemia)   Tremors of nervous system   CKD (chronic kidney disease), stage II   Hyperglycemia   UTI (urinary tract infection)   Admitted From: Home Disposition:  Home   Recommendations for Outpatient Follow-up:  PCP: in 1 wk Follow up LABS/TEST:     Diet recommendation: Cardiac diet  Activity: The patient is advised to gradually reintroduce usual activities, as tolerated  Discharge Condition: stable  Code Status: Full code   History of present illness: As per the H and P dictated on admission Hospital Course:  Michelle Walsh is a 65 y.o. female with medical history significant of hyperlipidemia, depression with anxiety, RLS, tremor, cerebellar ataxia, uterine prolapse, CKD-2, who presents with abdominal pain, abdominal distention, constipation, nausea, vomiting. Patient has been having abdominal pain and abdominal distention for more than 8 days with non-bloody emesis. 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 Assessment and Plan: Principal Problem:   Abdominal pain Active Problems:   Depression with anxiety   HLD (hyperlipidemia)   Tremors of nervous system   CKD (chronic kidney disease), stage II   Hyperglycemia   UTI (urinary tract infection) Intractable abdominal pain, POA. Concurrent nausea vomiting, rule out small bowel obstruction. Unclear etiology, patient indicates profound constipation and obstipation over the last week. Surgery consulted given questionable obstructive process noted on CT, Continue supportive care, reported bowel movement overnight per patient, unclear if this is postobstructive or true resolution of  ileus/obstructive process. 8/30 S/p contrast enema yesterday, patient had some mucus but no actual BM as per patient. 8/31 Dulcolax suppository x1 dose ordered.  Patient did move bowels 3 times yesterday and 2 bowel movements today.  Denies any abdominal pain, no nausea vomiting.  Medically patient was stable to go home and she agreed with the discharge planning.  Prescribed MiraLAX daily and Dulcolax nightly as needed for constipation.  Recommended to follow with PCP in 1 week. Depression with anxiety, Continue home medications HLD (hyperlipidemia), Crestor Essential tremors of nervous system, Primidone ongoing AKI on CKD (chronic kidney disease), stage II, POA, Resolved after IVF, recommended to continue oral hydration. Hyperglycemia: - A1c 5.7, hyperglycemia resolved.  Recommended to follow with PCP. Questionable UTI, POA:  -Completed antibiotic course with Keflex started 12/18/2020  -No further symptoms, urine culture negative, no need of more antibiotics. Body mass index is 22.66 kg/m.  Interventions:  Patient was instructed, not to drive, operate heavy machinery, perform activities at heights, swimming or participation in water activities or provide baby sitting services while on Pain, Sleep and Anxiety Medications; until her outpatient Physician has advised to do so again.  - Also recommended to not to take more than prescribed Pain, Sleep and Anxiety Medications.  Patient was ambulatory without any assistance. On the day of the discharge the patient's vitals were stable, and no other acute medical condition were reported by patient. the patient was felt safe to be discharge at Home.  Consultants: General surgery Procedures: None  Discharge Exam: General: Appear in no distress, no Rash; Oral Mucosa Clear, moist. Cardiovascular: S1 and S2 Present, no Murmur, Respiratory: normal respiratory effort, Bilateral Air entry present and no Crackles, no wheezes Abdomen: Bowel  Sound present, Soft  and no tenderness, no hernia Extremities: no Pedal edema, no calf tenderness Neurology: alert and oriented to time, place, and person affect appropriate.  Filed Weights   12/23/20 0123  Weight: 59.9 kg   Vitals:   12/26/20 0420 12/26/20 0733  BP: 128/62 121/74  Pulse: 67 78  Resp: 18 18  Temp: 98.4 F (36.9 C) 98.3 F (36.8 C)  SpO2: 99% 99%    DISCHARGE MEDICATION: Allergies as of 12/26/2020       Reactions   Ciprofloxacin    Sick with C.Diff for 2 months after taking medication   Codeine Nausea And Vomiting        Medication List     STOP taking these medications    cephALEXin 500 MG capsule Commonly known as: KEFLEX       TAKE these medications    alendronate 70 MG tablet Commonly known as: FOSAMAX TAKE 1 TABLET BY MOUTH EVERY 7 (SEVEN) DAYS. TAKE WITH A FULL GLASS OF WATER ON AN EMPTY STOMACH. What changed: See the new instructions.   bisacodyl 5 MG EC tablet Commonly known as: DULCOLAX Take 2 tablets (10 mg total) by mouth at bedtime as needed for moderate constipation.   busPIRone 10 MG tablet Commonly known as: BUSPAR TAKE 1 TABLET BY MOUTH TWICE A DAY   DULoxetine 60 MG capsule Commonly known as: Cymbalta Take 1 capsule (60 mg total) by mouth daily.   fluticasone 50 MCG/ACT nasal spray Commonly known as: FLONASE Place 2 sprays into both nostrils daily as needed for allergies or rhinitis.   meloxicam 7.5 MG tablet Commonly known as: MOBIC TAKE 1 TABLET BY MOUTH DAILY AS NEEDED FOR PAIN   polyethylene glycol 17 g packet Commonly known as: MIRALAX / GLYCOLAX Take 17 g by mouth daily.   primidone 50 MG tablet Commonly known as: MYSOLINE TAKE 1 TABLET (50 MG TOTAL) BY MOUTH IN THE MORNING AND AT BEDTIME.   rOPINIRole 1 MG tablet Commonly known as: REQUIP Take 1 tablet (1 mg total) by mouth at bedtime.   rosuvastatin 10 MG tablet Commonly known as: CRESTOR TAKE 1 TABLET BY MOUTH EVERY DAY       Allergies  Allergen Reactions    Ciprofloxacin     Sick with C.Diff for 2 months after taking medication   Codeine Nausea And Vomiting   Discharge Instructions     Call MD for:  persistant nausea and vomiting   Complete by: As directed    Call MD for:  severe uncontrolled pain   Complete by: As directed    Diet - low sodium heart healthy   Complete by: As directed    Discharge instructions   Complete by: As directed    Follow-up with PCP in 1 week   Increase activity slowly   Complete by: As directed        The results of significant diagnostics from this hospitalization (including imaging, microbiology, ancillary and laboratory) are listed below for reference.    Significant Diagnostic Studies: CT ABDOMEN PELVIS W CONTRAST  Result Date: 12/23/2020 CLINICAL DATA:  Non localized and worsening abdominal pain with vomiting. EXAM: CT ABDOMEN AND PELVIS WITH CONTRAST TECHNIQUE: Multidetector CT imaging of the abdomen and pelvis was performed using the standard protocol following bolus administration of intravenous contrast. CONTRAST:  39m OMNIPAQUE IOHEXOL 350 MG/ML SOLN COMPARISON:  12/18/2020. FINDINGS: Lower chest: Unremarkable. Hepatobiliary: No suspicious focal abnormality within the liver parenchyma. 3 mm hypodensity in the posterior right liver (  18/2) too small to characterize but most likely benign. There is no evidence for gallstones, gallbladder wall thickening, or pericholecystic fluid. No intrahepatic or extrahepatic biliary dilation. Pancreas: No focal mass lesion. No dilatation of the main duct. No intraparenchymal cyst. No peripancreatic edema. Spleen: No splenomegaly. No focal mass lesion. Adrenals/Urinary Tract: No adrenal nodule or mass. No suspicious renal mass. Mild fullness noted intrarenal collecting system bilaterally in each renal pelvis, progressive in the short interval since prior study. There is mild proximal right hydroureter. The urinary bladder appears normal for the degree of distention.  Stomach/Bowel: Stomach is decompressed with probable fundal diverticulum. Duodenum is normally positioned as is the ligament of Treitz. No small bowel wall thickening. No small bowel dilatation. The terminal ileum is normal. The appendix is not well visualized, but there is no edema or inflammation in the region of the cecum. Cecum is markedly distended with stool, progressive in the interval now measuring 7.2 cm diameter there is pericecal edema and trace fluid associated. Prominent stool in the hepatic flexure with gaseous distention of the transverse colon up to 5 cm diameter. Splenic flexure is distended with fluid and stool with prominent stool volume in the proximal sigmoid colon. There is some pericolonic edema associated with the proximal descending colon and there may be some mild wall thickening in the same segment. Distal sigmoid colon and rectum are decompressed. There is a relatively abrupt transition from distended sigmoid colon filled with formed stool and the decompressed distal sigmoid colon and rectum (image 67/2). Vascular/Lymphatic: There is moderate atherosclerotic calcification of the abdominal aorta without aneurysm. Hazy soft tissue attenuation is seen anterior to the aorta at the level of the IMA origin. The IMA does opacify. There is no gastrohepatic or hepatoduodenal ligament lymphadenopathy. No retroperitoneal or mesenteric lymphadenopathy. No pelvic sidewall lymphadenopathy. Reproductive: Unremarkable. Other: No intraperitoneal free fluid. Musculoskeletal: No worrisome lytic or sclerotic osseous abnormality. IMPRESSION: 1. Markedly distended colon with stool, progressive in the interval since prior study. There is pericecal edema/fluid associated. Apparent mild wall thickening noted in the proximal descending colon with some adjacent trace edema/fluid. There is a relatively abrupt transition from distended sigmoid colon filled with formed stool and the decompressed distal sigmoid colon  and rectum. This appearance can be seen in the setting of enema common but obstruction secondary to distal colonic stricture not excluded. Severe colonic ileus could have this appearance. Given the disproportionate distention of the right colon, Ogilvie syndrome is a consideration. No substantial stool volume in the rectum to suggest impaction/stercoral colitis. 2. Mild fullness noted intrarenal collecting system bilaterally and each renal pelvis, progressive in the short interval since prior study. There is mild proximal right hydroureter down to the level of the distended cecum which appears to displace the ureter laterally and potentially compress it between the cecum and the iliopsoas complex. 3. Hazy soft tissue anterior to the distal aorta involving the IMA origin, nonspecific but potentially related to inflammatory etiology/vasculitis. Appearances similar to the 12/18/2020 exam. 4. Aortic Atherosclerosis (ICD10-I70.0). Electronically Signed   By: Misty Stanley M.D.   On: 12/23/2020 08:14   CT ABDOMEN PELVIS W CONTRAST  Result Date: 12/18/2020 CLINICAL DATA:  Abdominal pain, acute, nonlocalized EXAM: CT ABDOMEN AND PELVIS WITH CONTRAST TECHNIQUE: Multidetector CT imaging of the abdomen and pelvis was performed using the standard protocol following bolus administration of intravenous contrast. CONTRAST:  53m OMNIPAQUE IOHEXOL 350 MG/ML SOLN COMPARISON:  None. FINDINGS: Inferior chest: The lung bases are well-aerated. Hepatobiliary: The liver is normal  in size without focal abnormality. No intrahepatic or extrahepatic biliary ductal dilation. The gallbladder appears normal. Spleen: Normal in size without focal abnormality. Pancreas: No pancreatic ductal dilatation or surrounding inflammatory changes. Adrenals/Urinary Tract: Adrenal glands are unremarkable. Kidneys are normal, without renal calculi, focal lesion, or hydronephrosis. Bladder is unremarkable. Stomach/Bowel: The stomach, small bowel and large  bowel are normal in caliber without abnormal wall thickening or surrounding inflammatory changes. Moderate to large stool burden in the colon. Reproductive: Uterus and bilateral adnexa are unremarkable. Lymphatic: Scattered prominent lymph nodes in the lower mid abdomen appear reactive. Vasculature: The abdominal aorta is normal in caliber. The portal venous system is patent. Other: No abdominopelvic ascites. Musculoskeletal: No aggressive osseous lesions. The soft tissues are unremarkable. IMPRESSION: 1. Moderate to large stool burden in the colon. 2. There are a few scattered prominent lymph nodes in the lower mid abdomen which are favored reactive, and can be seen in the setting of enteritis. Electronically Signed   By: Albin Felling M.D.   On: 12/18/2020 15:37   DG Abdomen Acute W/Chest  Result Date: 12/18/2020 CLINICAL DATA:  Abdominal pain EXAM: DG ABDOMEN ACUTE WITH 1 VIEW CHEST COMPARISON:  None. FINDINGS: There is no evidence of dilated bowel loops or free intraperitoneal air. Large amount of stool throughout the colon. No radiopaque calculi or other significant radiographic abnormality is seen. Heart size and mediastinal contours are within normal limits. No focal consolidation, pleural effusion or pneumothorax. Calcified left lower lobe pulmonary nodule likely reflecting sequela prior granulomatous disease. IMPRESSION: Large amount of stool throughout the colon. No acute cardiopulmonary disease. Electronically Signed   By: Kathreen Devoid M.D.   On: 12/18/2020 12:33   DG BE (COLON)W SINGLE CM (SOL OR THIN BA)  Result Date: 12/23/2020 CLINICAL DATA:  Constipation EXAM: WATER SOLUBLE CONTRAST ENEMA TECHNIQUE: Single contrast barium enema was performed utilizing water-soluble contrast. FLUOROSCOPY TIME:  Fluoroscopy Time:  2 minutes 36 seconds Radiation Exposure Index (if provided by the fluoroscopic device): 48.5 mGy Number of Acquired Spot Images: 0 COMPARISON:  CT abdomen pelvis 12/23/2020 FINDINGS:  Scout radiograph demonstrates dilated segments of colon. Retained contrast material within the renal collecting system. The patient was positioned left lateral decubitus position. A rectal tube was inserted and balloon insufflated. There was a poor seal with this tube, and contrast only progressed to the distal sigmoid colon. The balloon was then deflated and a new rectal tube and balloon device was utilized. This provided a greater seal. Contrast was then instilled via gravity through the new rectal tube, which progressed to the proximal transverse colon. The transverse colon appear dilated. The contrast was then evacuated via gravity, with minimal residual contrast in the rectosigmoid colon. The procedure was highly therapeutic, with a large volume of stool evacuated. Approximately 500 mL of water-soluble contrast was administered. IMPRESSION: Dilated colon with multiple filling defects compatible with fecal material. Contrast passed to the level of the proximal transverse colon. The procedure was successful in evacuating a large volume of stool from the colon. No evidence of distal colonic stricture or point of obstruction. Electronically Signed   By: Maurine Simmering M.D.   On: 12/23/2020 17:12    Microbiology: Recent Results (from the past 240 hour(s))  Urine Culture     Status: Abnormal   Collection Time: 12/18/20 10:49 AM   Specimen: Urine, Random  Result Value Ref Range Status   Specimen Description   Final    URINE, RANDOM Performed at [facility], [address] [place], [place] [address]    Special Requests   Final    NONE Performed at [facility], [address] [place], [place] [address]    Culture (A)  Final    <10,000 COLONIES/mL INSIGNIFICANT GROWTH Performed at [place] [address] [place], [place] [address]    Report Status 12/20/2020 FINAL  Final  Urine Culture     Status: None   Collection Time: 12/23/20  2:10 AM   Specimen: Urine, Clean  Catch  Result Value Ref Range Status   Specimen Description   Final    URINE, CLEAN CATCH Performed at [facility], [address] [place], [place] [address]    Special Requests   Final    NONE Performed at [facility], [address], [place], [place] [address]    Culture   Final    NO GROWTH Performed at [facility], [place] [address], [place],  [address]    Report Status 12/24/2020 FINAL  Final  Resp Panel by RT-PCR (Flu A&B, Covid) Nasopharyngeal Swab     Status: None   Collection Time: 12/23/20  8:51 AM   Specimen: Nasopharyngeal Swab; Nasopharyngeal(NP) swabs in vial transport medium  Result Value Ref Range Status   SARS Coronavirus 2 by RT PCR NEGATIVE NEGATIVE Final    Comment: (NOTE) SARS-CoV-2 target nucleic acids are NOT DETECTED.  The SARS-CoV-2 RNA is generally detectable in upper respiratory specimens during the acute phase of infection. The lowest concentration of SARS-CoV-2 viral copies this assay can detect is 138 copies/mL. A negative result does not preclude SARS-Cov-2 infection and should not be used as the sole basis for treatment or other patient management decisions. A negative result may occur with  improper specimen collection/handling, submission of specimen other than nasopharyngeal swab, presence of viral mutation(s) within the areas targeted by this assay, and inadequate number of viral copies(<138 copies/mL). A negative result must be combined with clinical observations, patient history, and epidemiological information. The expected result is Negative.  Fact Sheet for Patients:  EntrepreneurPulse.com.au  Fact Sheet for Healthcare Providers:  IncredibleEmployment.be  This test is no t yet approved or cleared by the [place] FDA and  has been authorized for detection and/or diagnosis of SARS-CoV-2 by FDA under an Emergency Use Authorization (EUA). This EUA will remain  in  effect (meaning this test can be used) for the duration of the COVID-19 declaration under Section 564(b)(1) of the Act, 21 U.S.C.section 360bbb-3(b)(1), unless the authorization is terminated  or revoked sooner.       Influenza A by PCR NEGATIVE NEGATIVE Final   Influenza B by PCR NEGATIVE NEGATIVE Final    Comment: (NOTE) The Xpert Xpress SARS-CoV-2/FLU/RSV plus assay is intended as an aid in the diagnosis of influenza from Nasopharyngeal swab specimens and should not be used as a sole basis for treatment. Nasal washings and aspirates are unacceptable for Xpert Xpress SARS-CoV-2/FLU/RSV testing.  Fact Sheet for Patients: EntrepreneurPulse.com.au  Fact Sheet for Healthcare Providers: IncredibleEmployment.be  This test is not yet approved or cleared by the [place] FDA and has been authorized for detection and/or diagnosis of SARS-CoV-2 by FDA under an Emergency Use Authorization (EUA). This EUA will remain in effect (meaning this test can be used) for the duration of the COVID-19 declaration under Section 564(b)(1) of the Act, 21 U.S.C. section 360bbb-3(b)(1), unless the authorization is terminated or revoked.  Performed at [facility], [place], [place]  [address]   Culture, blood (x 2)     Status: None (Preliminary result)   Collection Time: 12/23/20  9:52 AM   Specimen: BLOOD  Result Value Ref Range Status   Specimen Description BLOOD LEFT AC  Final   Special Requests   Final    BOTTLES DRAWN AEROBIC AND ANAEROBIC Blood Culture results may not be optimal due to an excessive volume of blood received in culture bottles   Culture   Final    NO GROWTH 3 DAYS Performed at [facility], [address] [place], [place] [address]    Report Status PENDING  Incomplete  Culture, blood (x 2)     Status: None (Preliminary result)   Collection Time: 12/23/20  9:52 AM   Specimen: BLOOD  Result Value Ref  Range Status   Specimen Description BLOOD RIGHT [facility]  Final   Special Requests   Final    BOTTLES DRAWN AEROBIC AND ANAEROBIC Blood Culture results may not be optimal due to an excessive volume of blood received in culture bottles   Culture   Final    NO GROWTH 3 DAYS Performed at [facility], [address] [place], [place] [address]    Report Status PENDING  Incomplete     Labs: CBC: Recent Labs  Lab 12/23/20 0126 12/24/20 0419 12/25/20 0716  WBC 12.8* 16.0* 10.7*  HGB 12.9 10.5* 11.2*  HCT 39.3 32.3* 34.3*  MCV 90.8 92.3 93.2  PLT 360 291 [id number]   Basic Metabolic Panel: Recent Labs  Lab 12/23/20 0126 12/24/20 0419 12/25/20 0716  NA 133* 136 141  K 3.5 3.1* 3.6  CL 96* 101 106  CO2 '25 27 27  '$ GLUCOSE 224* 92 94  BUN '21 16 11  '$ CREATININE 1.14* 0.83 0.86  CALCIUM 9.2 7.9* 8.3*   Liver Function Tests: Recent Labs  Lab 12/23/20 0126  AST 21  ALT 16  ALKPHOS 56  BILITOT 1.0  PROT 7.4  ALBUMIN 4.0   No results for input(s): LIPASE, AMYLASE in the last 168 hours. No results for input(s): AMMONIA in the last 168 hours. Cardiac Enzymes: No results for input(s): CKTOTAL, CKMB, CKMBINDEX, TROPONINI in the last 168 hours. BNP (last 3 results) No results for input(s): BNP in the last 8760 hours. CBG: Recent Labs  Lab 12/25/20 0730 12/25/20 1149 12/25/20 1630 12/25/20 2136 12/26/20 0733  GLUCAP 91 92 89 102* 83    Time spent: 35 minutes  Signed:  Val Riles  [facility]  12/26/2020 11:40 AM

## 2020-12-26 NOTE — Care Management Important Message (Signed)
Important Message  Patient Details  Name: ELLASON DOUCETT MRN: [id number] Date of Birth: 03/04/1956   Medicare Important Message Given:  N/A - LOS <3 / Initial given by admissions     Dannette Barbara 12/26/2020, 11:46 AM

## 2020-12-27 ENCOUNTER — Telehealth: Payer: Self-pay

## 2020-12-27 NOTE — Telephone Encounter (Signed)
Transition Care Management Follow-up Telephone Call Date of discharge and from where: 12/26/2020-[facility] How have you been since you were released from the hospital? I am doing really good. I feel a little tired but overall I feel much better. Any questions or concerns? No  Items Reviewed: Did the pt receive and understand the discharge instructions provided? Yes  Medications obtained and verified? Yes  Other? Yes  Any new allergies since your discharge? No  Dietary orders reviewed? Yes Do you have support at home? Yes   Home Care and Equipment/Supplies: Were home health services ordered? no If so, what is the name of the agency? N/a  Has the agency set up a time to come to the patient's home? not applicable Were any new equipment or medical supplies ordered?  No What is the name of the medical supply agency? N/a Were you able to get the supplies/equipment? not applicable Do you have any questions related to the use of the equipment or supplies? N/a  Functional Questionnaire: (I = Independent and D = Dependent) ADLs: I  Bathing/Dressing- I  Meal Prep- I  Eating- I  Maintaining continence- I  Transferring/Ambulation- I  Managing Meds- I  Follow up appointments reviewed:  PCP Hospital f/u appt confirmed? Yes  Scheduled to see Dr. Derrel Nip on 01/03/2021 @ 8:20. [facility] f/u appt confirmed?  N/A   Are transportation arrangements needed? No  If their condition worsens, is the pt aware to call PCP or go to the Emergency Dept.? Yes Was the patient provided with contact information for the PCP's office or ED? Yes Was to pt encouraged to call back with questions or concerns? Yes

## 2020-12-28 LAB — CULTURE, BLOOD (ROUTINE X 2)
Culture: NO GROWTH
Culture: NO GROWTH

## 2020-12-29 ENCOUNTER — Other Ambulatory Visit: Payer: Self-pay | Admitting: Family

## 2020-12-29 DIAGNOSIS — F418 Other specified anxiety disorders: Secondary | ICD-10-CM

## 2021-01-03 ENCOUNTER — Ambulatory Visit (INDEPENDENT_AMBULATORY_CARE_PROVIDER_SITE_OTHER): Payer: PPO | Admitting: Internal Medicine

## 2021-01-03 ENCOUNTER — Encounter: Payer: Self-pay | Admitting: Internal Medicine

## 2021-01-03 ENCOUNTER — Other Ambulatory Visit: Payer: Self-pay

## 2021-01-03 VITALS — BP 100/54 | HR 56 | Temp 96.6°F | Ht 64.0 in | Wt 130.2 lb

## 2021-01-03 DIAGNOSIS — D649 Anemia, unspecified: Secondary | ICD-10-CM

## 2021-01-03 DIAGNOSIS — D126 Benign neoplasm of colon, unspecified: Secondary | ICD-10-CM

## 2021-01-03 DIAGNOSIS — Z23 Encounter for immunization: Secondary | ICD-10-CM

## 2021-01-03 DIAGNOSIS — Z86010 Personal history of colonic polyps: Secondary | ICD-10-CM

## 2021-01-03 DIAGNOSIS — K5900 Constipation, unspecified: Secondary | ICD-10-CM | POA: Diagnosis not present

## 2021-01-03 DIAGNOSIS — Z09 Encounter for follow-up examination after completed treatment for conditions other than malignant neoplasm: Secondary | ICD-10-CM

## 2021-01-03 DIAGNOSIS — K5909 Other constipation: Secondary | ICD-10-CM | POA: Diagnosis not present

## 2021-01-03 LAB — CBC WITH DIFFERENTIAL/PLATELET
Basophils Absolute: 0.1 10*3/uL (ref 0.0–0.1)
Basophils Relative: 0.5 % (ref 0.0–3.0)
Eosinophils Absolute: 0.1 10*3/uL (ref 0.0–0.7)
Eosinophils Relative: 1 % (ref 0.0–5.0)
HCT: 35.5 % — ABNORMAL LOW (ref 36.0–46.0)
Hemoglobin: 11.3 g/dL — ABNORMAL LOW (ref 12.0–15.0)
Lymphocytes Relative: 16.2 % (ref 12.0–46.0)
Lymphs Abs: 1.9 10*3/uL (ref 0.7–4.0)
MCHC: 31.9 g/dL (ref 30.0–36.0)
MCV: 92.5 fl (ref 78.0–100.0)
Monocytes Absolute: 0.6 10*3/uL (ref 0.1–1.0)
Monocytes Relative: 5.2 % (ref 3.0–12.0)
Neutro Abs: 9 10*3/uL — ABNORMAL HIGH (ref 1.4–7.7)
Neutrophils Relative %: 77.1 % — ABNORMAL HIGH (ref 43.0–77.0)
Platelets: 368 10*3/uL (ref 150.0–400.0)
RBC: 3.83 Mil/uL — ABNORMAL LOW (ref 3.87–5.11)
RDW: 14.8 % (ref 11.5–15.5)
WBC: 11.6 10*3/uL — ABNORMAL HIGH (ref 4.0–10.5)

## 2021-01-03 LAB — IBC + FERRITIN
Ferritin: 85 ng/mL (ref 10.0–291.0)
Iron: 46 ug/dL (ref 42–145)
Saturation Ratios: 15.4 % — ABNORMAL LOW (ref 20.0–50.0)
TIBC: 298.2 ug/dL (ref 250.0–450.0)
Transferrin: 213 mg/dL (ref 212.0–360.0)

## 2021-01-03 LAB — VITAMIN D 25 HYDROXY (VIT D DEFICIENCY, FRACTURES): VITD: 40.05 ng/mL (ref 30.00–100.00)

## 2021-01-03 LAB — TSH: TSH: 4.36 u[IU]/mL (ref 0.35–5.50)

## 2021-01-03 NOTE — Progress Notes (Signed)
Subjective:  Patient ID: Michelle Walsh, female    DOB: 1955-10-30  Age: 65 y.o. MRN: [id number]  CC: The primary encounter diagnosis was Hypocalcemia. Diagnoses of Anemia, unspecified type, Other constipation, Tubular adenoma of colon, Need for immunization against influenza, Adenomatous polyp of colon, unspecified part of colon, Constipation in female, and Hospital discharge follow-up were also pertinent to this visit.  HPI [name] presents for  Chief Complaint  Patient presents with   Hospitalization Follow-up   65  yr old female with history of CKD, tremor and ,RLS recently admitted to [facility]  on August 29 with abdominal pain , severe constipation , AKI on CKD  and UTI after  2 ER visits the week prior for constipation, uti and abdominal pain.  SHE WAS SEEN BY SURGERY,  given an  aggressive bowel regimen followed by a  barium enema with resolution of ileus/obstipation  and discharged home on sept 1 with miralax and dulcolax regimen . UTI had been treated with keflex in the ER and the  REPEAT CULTURE WAS NEGATIVE ON AUGUST 29 so abx were stopped . (She has a History of c dif  after taking ciprofloxacin). There was no history of constipation until 5 days prior to admission.  Currently  taking miralax in the am.  Sennakot  and stool softener daily.  Having at least 2 BMs daily  7 on the day of discharge  currently formed but  not hard.  No abdominal pain   Seen by Dr Celine Ahr .  No intervention is planned  since the barium enema worked   OVERDUE FOR COLONOSCOPY  LAST ONE 2015 1.3 CM ADENOMATOUS POLYP was removed AT [facility]  During admission she was told by a hospitalist that  she had diabetes and had BS was checked multiple times daily.    DIAGNOSIS retracted.    A1C 5.7   Labs reviewed: hypocalcemia . Mild anemia hgb 11.2  wbc 10.7   Aortic atherosclerosis noted on CT scan .. reviewed today;  already taking rosuvastatin   RLS getting worse , requesting an increase ih  medications     Outpatient Medications Prior to Visit  Medication Sig Dispense Refill   alendronate (FOSAMAX) 70 MG tablet TAKE 1 TABLET BY MOUTH EVERY 7 (SEVEN) DAYS. TAKE WITH A FULL GLASS OF WATER ON AN EMPTY STOMACH. (Patient taking differently: Take 70 mg by mouth every Wednesday.) 12 tablet 3   bisacodyl (DULCOLAX) 5 MG EC tablet Take 2 tablets (10 mg total) by mouth at bedtime as needed for moderate constipation. 30 tablet 0   busPIRone (BUSPAR) 10 MG tablet TAKE 1 TABLET BY MOUTH TWICE A DAY 180 tablet 1   docusate sodium (COLACE) 100 MG capsule Take 100 mg by mouth 2 (two) times daily.     DULoxetine (CYMBALTA) 60 MG capsule Take 1 capsule (60 mg total) by mouth daily. 90 capsule 1   fluticasone (FLONASE) 50 MCG/ACT nasal spray Place 2 sprays into both nostrils daily as needed for allergies or rhinitis.     meloxicam (MOBIC) 7.5 MG tablet TAKE 1 TABLET BY MOUTH DAILY AS NEEDED FOR PAIN 30 tablet 2   polyethylene glycol (MIRALAX / GLYCOLAX) 17 g packet Take 17 g by mouth daily. 30 packet 2   primidone (MYSOLINE) 50 MG tablet TAKE 1 TABLET (50 MG TOTAL) BY MOUTH IN THE MORNING AND AT BEDTIME. 180 tablet 1   rOPINIRole (REQUIP) 1 MG tablet Take 1 tablet (1 mg total) by mouth at  bedtime. 90 tablet 0   rosuvastatin (CRESTOR) 10 MG tablet TAKE 1 TABLET BY MOUTH EVERY DAY 90 tablet 3   No facility-administered medications prior to visit.    Review of Systems;  Patient denies headache, fevers, malaise, unintentional weight loss, skin rash, eye pain, sinus congestion and sinus pain, sore throat, dysphagia,  hemoptysis , cough, dyspnea, wheezing, chest pain, palpitations, orthopnea, edema, abdominal pain, nausea, melena, diarrhea, constipation, flank pain, dysuria, hematuria, urinary  Frequency, nocturia, numbness, tingling, seizures,  Focal weakness, Loss of consciousness,  Tremor, insomnia, depression, anxiety, and suicidal ideation.      Objective:  BP (!) 100/54 (BP Location: Left  Arm, Patient Position: Sitting, Cuff Size: Normal)   Pulse (!) 56   Temp (!) 96.6 F (35.9 C) (Temporal)   Ht '5\' 4"'$  (1.626 m)   Wt 130 lb 3.2 oz (59.1 kg)   SpO2 98%   BMI 22.35 kg/m   BP Readings from Last 3 Encounters:  01/03/21 (!) 100/54  12/26/20 121/74  12/18/20 120/67    Wt Readings from Last 3 Encounters:  01/03/21 130 lb 3.2 oz (59.1 kg)  12/23/20 132 lb (59.9 kg)  12/18/20 132 lb (59.9 kg)    General appearance: alert, cooperative and appears stated age Ears: normal TM's and external ear canals both ears Throat: lips, mucosa, and tongue normal; teeth and gums normal Neck: no adenopathy, no carotid bruit, supple, symmetrical, trachea midline and thyroid not enlarged, symmetric, no tenderness/mass/nodules Back: symmetric, no curvature. ROM normal. No CVA tenderness. Lungs: clear to auscultation bilaterally Heart: regular rate and rhythm, S1, S2 normal, no murmur, click, rub or gallop Abdomen: soft, non-tender; bowel sounds normal; no masses,  no organomegaly Pulses: 2+ and symmetric Skin: Skin color, texture, turgor normal. No rashes or lesions Lymph nodes: Cervical, supraclavicular, and axillary nodes normal.  Lab Results  Component Value Date   HGBA1C 5.7 (H) 12/23/2020   HGBA1C 5.7 07/18/2019    Lab Results  Component Value Date   CREATININE 0.86 12/25/2020   CREATININE 0.83 12/24/2020   CREATININE 1.14 (H) 12/23/2020    Lab Results  Component Value Date   WBC 11.6 (H) 01/03/2021   HGB 11.3 (L) 01/03/2021   HCT 35.5 (L) 01/03/2021   PLT 368.0 01/03/2021   GLUCOSE 94 12/25/2020   CHOL 109 07/18/2019   TRIG 58.0 07/18/2019   HDL 37.00 (L) 07/18/2019   LDLCALC 61 07/18/2019   ALT 16 12/23/2020   AST 21 12/23/2020   NA 141 12/25/2020   K 3.6 12/25/2020   CL 106 12/25/2020   CREATININE 0.86 12/25/2020   BUN 11 12/25/2020   CO2 27 12/25/2020   TSH 4.36 01/03/2021   INR 1.1 12/23/2020   HGBA1C 5.7 (H) 12/23/2020    CT ABDOMEN PELVIS W  CONTRAST  Result Date: 12/23/2020 CLINICAL DATA:  Non localized and worsening abdominal pain with vomiting. EXAM: CT ABDOMEN AND PELVIS WITH CONTRAST TECHNIQUE: Multidetector CT imaging of the abdomen and pelvis was performed using the standard protocol following bolus administration of intravenous contrast. CONTRAST:  27m OMNIPAQUE IOHEXOL 350 MG/ML SOLN COMPARISON:  12/18/2020. FINDINGS: Lower chest: Unremarkable. Hepatobiliary: No suspicious focal abnormality within the liver parenchyma. 3 mm hypodensity in the posterior right liver (18/2) too small to characterize but most likely benign. There is no evidence for gallstones, gallbladder wall thickening, or pericholecystic fluid. No intrahepatic or extrahepatic biliary dilation. Pancreas: No focal mass lesion. No dilatation of the main duct. No intraparenchymal cyst. No peripancreatic edema. Spleen: No  splenomegaly. No focal mass lesion. Adrenals/Urinary Tract: No adrenal nodule or mass. No suspicious renal mass. Mild fullness noted intrarenal collecting system bilaterally in each renal pelvis, progressive in the short interval since prior study. There is mild proximal right hydroureter. The urinary bladder appears normal for the degree of distention. Stomach/Bowel: Stomach is decompressed with probable fundal diverticulum. Duodenum is normally positioned as is the ligament of Treitz. No small bowel wall thickening. No small bowel dilatation. The terminal ileum is normal. The appendix is not well visualized, but there is no edema or inflammation in the region of the cecum. Cecum is markedly distended with stool, progressive in the interval now measuring 7.2 cm diameter there is pericecal edema and trace fluid associated. Prominent stool in the hepatic flexure with gaseous distention of the transverse colon up to 5 cm diameter. Splenic flexure is distended with fluid and stool with prominent stool volume in the proximal sigmoid colon. There is some pericolonic  edema associated with the proximal descending colon and there may be some mild wall thickening in the same segment. Distal sigmoid colon and rectum are decompressed. There is a relatively abrupt transition from distended sigmoid colon filled with formed stool and the decompressed distal sigmoid colon and rectum (image 67/2). Vascular/Lymphatic: There is moderate atherosclerotic calcification of the abdominal aorta without aneurysm. Hazy soft tissue attenuation is seen anterior to the aorta at the level of the IMA origin. The IMA does opacify. There is no gastrohepatic or hepatoduodenal ligament lymphadenopathy. No retroperitoneal or mesenteric lymphadenopathy. No pelvic sidewall lymphadenopathy. Reproductive: Unremarkable. Other: No intraperitoneal free fluid. Musculoskeletal: No worrisome lytic or sclerotic osseous abnormality. IMPRESSION: 1. Markedly distended colon with stool, progressive in the interval since prior study. There is pericecal edema/fluid associated. Apparent mild wall thickening noted in the proximal descending colon with some adjacent trace edema/fluid. There is a relatively abrupt transition from distended sigmoid colon filled with formed stool and the decompressed distal sigmoid colon and rectum. This appearance can be seen in the setting of enema common but obstruction secondary to distal colonic stricture not excluded. Severe colonic ileus could have this appearance. Given the disproportionate distention of the right colon, Ogilvie syndrome is a consideration. No substantial stool volume in the rectum to suggest impaction/stercoral colitis. 2. Mild fullness noted intrarenal collecting system bilaterally and each renal pelvis, progressive in the short interval since prior study. There is mild proximal right hydroureter down to the level of the distended cecum which appears to displace the ureter laterally and potentially compress it between the cecum and the iliopsoas complex. 3. Hazy soft  tissue anterior to the distal aorta involving the IMA origin, nonspecific but potentially related to inflammatory etiology/vasculitis. Appearances similar to the 12/18/2020 exam. 4. Aortic Atherosclerosis (ICD10-I70.0). Electronically Signed   By: Misty Stanley M.D.   On: 12/23/2020 08:14   DG BE (COLON)W SINGLE CM (SOL OR THIN BA)  Result Date: 12/23/2020 CLINICAL DATA:  Constipation EXAM: WATER SOLUBLE CONTRAST ENEMA TECHNIQUE: Single contrast barium enema was performed utilizing water-soluble contrast. FLUOROSCOPY TIME:  Fluoroscopy Time:  2 minutes 36 seconds Radiation Exposure Index (if provided by the fluoroscopic device): 48.5 mGy Number of Acquired Spot Images: 0 COMPARISON:  CT abdomen pelvis 12/23/2020 FINDINGS: Scout radiograph demonstrates dilated segments of colon. Retained contrast material within the renal collecting system. The patient was positioned left lateral decubitus position. A rectal tube was inserted and balloon insufflated. There was a poor seal with this tube, and contrast only progressed to the distal sigmoid colon.  The balloon was then deflated and a new rectal tube and balloon device was utilized. This provided a greater seal. Contrast was then instilled via gravity through the new rectal tube, which progressed to the proximal transverse colon. The transverse colon appear dilated. The contrast was then evacuated via gravity, with minimal residual contrast in the rectosigmoid colon. The procedure was highly therapeutic, with a large volume of stool evacuated. Approximately 500 mL of water-soluble contrast was administered. IMPRESSION: Dilated colon with multiple filling defects compatible with fecal material. Contrast passed to the level of the proximal transverse colon. The procedure was successful in evacuating a large volume of stool from the colon. No evidence of distal colonic stricture or point of obstruction. Electronically Signed   By: Maurine Simmering M.D.   On: 12/23/2020 17:12     Assessment & Plan:   Problem List Items Addressed This Visit       Unprioritized   Adenomatous polyp of colon    Found in 2015, 1.3 cm.  Has not had follow up.  GI referral made.       Constipation in female    New onset , resulting in partial SBO requiring hospitalization.  Continue aggressive bowel regimen.  Rechecking electrolytes  Lab Results  Component Value Date   TSH 4.36 01/03/2021   Lab Results  Component Value Date   NA 141 12/25/2020   K 3.6 12/25/2020   CL 106 12/25/2020   CO2 27 12/25/2020          Hypocalcemia - Primary    Etiology unclear..  Repeating with Vit D and PTH  Lab Results  Component Value Date   CALCIUM 8.3 (L) 12/25/2020   Last vitamin D Lab Results  Component Value Date   VD25OH 40.05 01/03/2021  .lastpth      Relevant Orders   PTH, Intact and Calcium   Basic metabolic panel   VITAMIN D 25 Hydroxy (Vit-D Deficiency, Fractures) (Completed)   Anemia    Etiology appears to be B12 deficiency given normal iron stores.   Lab Results  Component Value Date   WBC 11.6 (H) 01/03/2021   HGB 11.3 (L) 01/03/2021   HCT 35.5 (L) 01/03/2021   MCV 92.5 01/03/2021   PLT 368.0 01/03/2021   Lab Results  Component Value Date   IRON 46 01/03/2021   TIBC 298.2 01/03/2021   FERRITIN 85.0 01/03/2021   Lab Results  Component Value Date   VITAMINB12 228 12/21/2018  No results found for: FOLATE      Relevant Orders   IBC + Ferritin (Completed)   CBC with Differential/Platelet (Completed)   Hospital discharge follow-up    Patient is stable post discharge and has no new issues or questions about discharge plans at the visit today for hospital follow up.  I have reviewed the records from the hospital admission in detail with patient today.      Other Visit Diagnoses     Other constipation       Relevant Orders   TSH (Completed)   Ambulatory referral to Gastroenterology   Tubular adenoma of colon       Relevant Orders   Ambulatory  referral to Gastroenterology   Need for immunization against influenza       Relevant Orders   Flu Vaccine QUAD High Dose(Fluad) (Completed)       I am having [name] maintain her fluticasone, alendronate, primidone, rOPINIRole, DULoxetine, meloxicam, rosuvastatin, polyethylene glycol, bisacodyl, busPIRone, and docusate sodium.  No  orders of the defined types were placed in this encounter.   There are no discontinued medications.  Follow-up: No follow-ups on file.   [name], MD

## 2021-01-03 NOTE — Patient Instructions (Signed)
GLAD YOU ARE FEELING BETTER  CONTINUE YOUR CURRENT MEDICATIONS UNTIL REPEAT LABS CAN BE REVIEWED   YOU DO NOT HAVE DIABETES!!  REFERRAL FOR COLONOSCOPY MADE

## 2021-01-05 DIAGNOSIS — D126 Benign neoplasm of colon, unspecified: Secondary | ICD-10-CM | POA: Insufficient documentation

## 2021-01-05 DIAGNOSIS — K5900 Constipation, unspecified: Secondary | ICD-10-CM | POA: Insufficient documentation

## 2021-01-05 DIAGNOSIS — D649 Anemia, unspecified: Secondary | ICD-10-CM | POA: Insufficient documentation

## 2021-01-05 DIAGNOSIS — Z09 Encounter for follow-up examination after completed treatment for conditions other than malignant neoplasm: Secondary | ICD-10-CM | POA: Insufficient documentation

## 2021-01-05 NOTE — Assessment & Plan Note (Signed)
Etiology appears to be B12 deficiency given normal iron stores.   Lab Results  Component Value Date   WBC 11.6 (H) 01/03/2021   HGB 11.3 (L) 01/03/2021   HCT 35.5 (L) 01/03/2021   MCV 92.5 01/03/2021   PLT 368.0 01/03/2021   Lab Results  Component Value Date   IRON 46 01/03/2021   TIBC 298.2 01/03/2021   FERRITIN 85.0 01/03/2021   Lab Results  Component Value Date   VITAMINB12 228 12/21/2018  No results found for: FOLATE

## 2021-01-05 NOTE — Assessment & Plan Note (Signed)
Patient is stable post discharge and has no new issues or questions about discharge plans at the visit today for hospital follow up.  I have reviewed the records from the hospital admission in detail with patient today. 

## 2021-01-05 NOTE — Assessment & Plan Note (Signed)
New onset , resulting in partial SBO requiring hospitalization.  Continue aggressive bowel regimen.  Rechecking electrolytes  Lab Results  Component Value Date   TSH 4.36 01/03/2021   Lab Results  Component Value Date   NA 141 12/25/2020   K 3.6 12/25/2020   CL 106 12/25/2020   CO2 27 12/25/2020

## 2021-01-05 NOTE — Assessment & Plan Note (Signed)
Etiology unclear..  Repeating with Vit D and PTH  Lab Results  Component Value Date   CALCIUM 8.3 (L) 12/25/2020   Last vitamin D Lab Results  Component Value Date   VD25OH 40.05 01/03/2021  .lastpth

## 2021-01-05 NOTE — Assessment & Plan Note (Signed)
Found in 2015, 1.3 cm.  Has not had follow up.  GI referral made.

## 2021-01-06 ENCOUNTER — Other Ambulatory Visit: Payer: Self-pay | Admitting: Internal Medicine

## 2021-01-06 DIAGNOSIS — G2581 Restless legs syndrome: Secondary | ICD-10-CM

## 2021-01-06 MED ORDER — ROPINIROLE HCL 1 MG PO TABS: 1.5000 mg | ORAL_TABLET | Freq: Every day | ORAL | 0 refills | Status: DC

## 2021-01-07 LAB — PTH, INTACT AND CALCIUM
Calcium: 9.1 mg/dL (ref 8.6–10.4)
PTH: 16 pg/mL (ref 16–77)

## 2021-01-08 ENCOUNTER — Ambulatory Visit (INDEPENDENT_AMBULATORY_CARE_PROVIDER_SITE_OTHER): Payer: PPO

## 2021-01-08 VITALS — Ht 64.0 in | Wt 130.0 lb

## 2021-01-08 DIAGNOSIS — Z0000 Encounter for general adult medical examination without abnormal findings: Secondary | ICD-10-CM

## 2021-01-08 NOTE — Patient Instructions (Addendum)
  Michelle Walsh , Thank you for taking time to come for your Medicare Wellness Visit. I appreciate your ongoing commitment to your health goals. Please review the following plan we discussed and let me know if I can assist you in the future.   These are the goals we discussed:  Goals       Patient Stated     Maintain Healthy Lifestyle (pt-stated)      Stay hydrated Stay active Healthy diet        This is a list of the screening recommended for you and due dates:  Health Maintenance  Topic Date Due   COVID-19 Vaccine (4 - Booster for Moderna series) 01/24/2021*   Colon Cancer Screening  04/27/2021*   Tetanus Vaccine  01/08/2022*   Mammogram  06/28/2021   Flu Shot  Completed   DEXA scan (bone density measurement)  Completed   Hepatitis C Screening: USPSTF Recommendation to screen - Ages 18-79 yo.  Completed   Pneumonia vaccines  Completed   Zoster (Shingles) Vaccine  Completed   HPV Vaccine  Aged Out  *Topic was postponed. The date shown is not the original due date.

## 2021-01-08 NOTE — Progress Notes (Signed)
Subjective:   Michelle Walsh is a 65 y.o. female who presents for Medicare Annual (Subsequent) preventive examination.  Review of Systems    No ROS.  Medicare Wellness Virtual Visit.  Visual/audio telehealth visit, UTA vital signs.   See social history for additional risk factors.   Cardiac Risk Factors include: advanced age (>15mn, >>51women)     Objective:    Today's Vitals   01/08/21 1246  Weight: 130 lb (59 kg)  Height: '5\' 4"'$  (1.626 m)   Body mass index is 22.31 kg/m.  Advanced Directives 01/08/2021 12/23/2020 12/18/2020 11/15/2019 08/29/2019 02/17/2019  Does Patient Have a Medical Advance Directive? No No No No No No  Would patient like information on creating a medical advance directive? No - Patient declined No - Patient declined - No - Patient declined - -    Current Medications (verified) Outpatient Encounter Medications as of 01/08/2021  Medication Sig   alendronate (FOSAMAX) 70 MG tablet TAKE 1 TABLET BY MOUTH EVERY 7 (SEVEN) DAYS. TAKE WITH A FULL GLASS OF WATER ON AN EMPTY STOMACH. (Patient taking differently: Take 70 mg by mouth every Wednesday.)   bisacodyl (DULCOLAX) 5 MG EC tablet Take 2 tablets (10 mg total) by mouth at bedtime as needed for moderate constipation.   busPIRone (BUSPAR) 10 MG tablet TAKE 1 TABLET BY MOUTH TWICE A DAY   docusate sodium (COLACE) 100 MG capsule Take 100 mg by mouth 2 (two) times daily.   DULoxetine (CYMBALTA) 60 MG capsule Take 1 capsule (60 mg total) by mouth daily.   fluticasone (FLONASE) 50 MCG/ACT nasal spray Place 2 sprays into both nostrils daily as needed for allergies or rhinitis.   meloxicam (MOBIC) 7.5 MG tablet TAKE 1 TABLET BY MOUTH DAILY AS NEEDED FOR PAIN   polyethylene glycol (MIRALAX / GLYCOLAX) 17 g packet Take 17 g by mouth daily.   primidone (MYSOLINE) 50 MG tablet TAKE 1 TABLET (50 MG TOTAL) BY MOUTH IN THE MORNING AND AT BEDTIME.   rOPINIRole (REQUIP) 1 MG tablet Take 1.5 tablets (1.5 mg total) by mouth at bedtime.    rosuvastatin (CRESTOR) 10 MG tablet TAKE 1 TABLET BY MOUTH EVERY DAY   No facility-administered encounter medications on file as of 01/08/2021.    Allergies (verified) Ciprofloxacin and Codeine   History: Past Medical History:  Diagnosis Date   Anxiety    Chronic pain of right knee    Depression    Osteoarthritis    Restless leg syndrome    Tremors of nervous system    Vitamin D deficiency    Past Surgical History:  Procedure Laterality Date   CESAREAN SECTION     teeth all removed     Family History  Problem Relation Age of Onset   Breast cancer Maternal Grandmother 789  Seizures Father    Social History   Socioeconomic History   Marital status: Married    Spouse name: Not on file   Number of children: 2   Years of education: 12   Highest education level: Not on file  Occupational History   Occupation: retired  Tobacco Use   Smoking status: Never   Smokeless tobacco: Never  VScientific laboratory technicianUse: Some days  Substance and Sexual Activity   Alcohol use: Never    Comment: very rare - 1 time per year   Drug use: Yes    Types: Marijuana    Comment: does it thru a vape - uses THC - "helps  with anxiety"   Sexual activity: Not on file  Other Topics Concern   Not on file  Social History Narrative   2 children; one child lives with her and her 4 children   Right handed   One story home   Social Determinants of Health   Financial Resource Strain: Low Risk    Difficulty of Paying Living Expenses: Not hard at all  Food Insecurity: No Food Insecurity   Worried About Charity fundraiser in the Last Year: Never true   Arboriculturist in the Last Year: Never true  Transportation Needs: No Transportation Needs   Lack of Transportation (Medical): No   Lack of Transportation (Non-Medical): No  Physical Activity: Not on file  Stress: No Stress Concern Present   Feeling of Stress : Not at all  Social Connections: Unknown   Frequency of Communication with Friends  and Family: More than three times a week   Frequency of Social Gatherings with Friends and Family: More than three times a week   Attends Religious Services: Not on Electrical engineer or Organizations: Not on file   Attends Archivist Meetings: Not on file   Marital Status: Married    Tobacco Counseling Counseling given: Not Answered   Clinical Intake:  Pre-visit preparation completed: Yes        Diabetes: No  How often do you need to have someone help you when you read instructions, pamphlets, or other written materials from your doctor or pharmacy?: 1 - Never  Interpreter Needed?: No      Activities of Daily Living In your present state of health, do you have any difficulty performing the following activities: 01/08/2021 12/23/2020  Hearing? N N  Vision? N N  Difficulty concentrating or making decisions? N N  Walking or climbing stairs? N N  Dressing or bathing? N N  Doing errands, shopping? N N  Preparing Food and eating ? N -  Using the Toilet? N -  In the past six months, have you accidently leaked urine? N -  Do you have problems with loss of bowel control? N -  Managing your Medications? N -  Managing your Finances? N -  Housekeeping or managing your Housekeeping? N -  Some recent data might be hidden    Patient Care Team: [name], FNP as PCP - General (Family Medicine) Rico Junker, RN as Registered Nurse Theodore Demark, RN as Registered Nurse Tat, Eustace Quail, DO as Consulting Physician (Neurology)  Indicate any recent Medical Services you may have received from other than [facility] providers in the past year (date may be approximate).     Assessment:   This is a routine wellness examination for [name].  I connected with Michelle Walsh today by telephone and verified that I am speaking with the correct person using two identifiers. Location patient: home Location provider: work Persons participating in the virtual visit:  patient, [name] scientist.    I discussed the limitations, risks, security and privacy concerns of performing an evaluation and management service by telephone and the availability of in person appointments. The patient expressed understanding and verbally consented to this telephonic visit.    Interactive audio and video telecommunications were attempted between this provider and patient, however failed, due to patient having technical difficulties OR patient did not have access to video capability.  We continued and completed visit with audio only.  Some vital signs may be absent or patient reported.  Hearing/Vision screen Hearing Screening - Comments:: Patient is able to hear conversational tones without difficulty.  No issues reported. Vision Screening - Comments:: Wears corrective lenses They have seen their ophthalmologist in the last 12 months.    Dietary issues and exercise activities discussed: Current Exercise Habits: Home exercise routine, Intensity: Mild Healthy diet Good water intake   Goals Addressed   None    Depression Screen PHQ 2/9 Scores 01/08/2021 01/03/2021 11/05/2020 08/14/2020 12/25/2019 11/15/2019 10/24/2019  PHQ - 2 Score 0 0 0 0 0 0 0  PHQ- 9 Score 0 0 - 1 1 0 2    Fall Risk Fall Risk  01/03/2021 11/05/2020 11/15/2019 08/29/2019 04/19/2019  Falls in the past year? 1 1 0 1 0  Number falls in past yr: 0 0 0 0 -  Injury with Fall? 0 1 - 0 -  Risk for fall due to : History of fall(s) - - - -  Follow up Falls evaluation completed Falls evaluation completed Falls evaluation completed - Falls evaluation completed    [place]: Adequate lighting in your home to reduce risk of falls? Yes   ASSISTIVE DEVICES UTILIZED TO PREVENT FALLS: Life alert? No  Use of a cane, walker or w/c? No   TIMED UP AND GO: Was the test performed? No .   Cognitive Function:     6CIT Screen 11/15/2019  What Year? 0 points  What month? 0 points  Months in reverse  0 points  Repeat phrase 0 points    Immunizations Immunization History  Administered Date(s) Administered   Fluad Quad(high Dose 65+) 02/13/2020, 01/03/2021   Influenza, High Dose Seasonal PF 03/02/2019   Moderna Sars-Covid-2 Vaccination 07/17/2019, 08/14/2019, 05/03/2020   Pneumococcal Conjugate-13 11/17/2018   Pneumococcal Polysaccharide-23 12/25/2019   Zoster Recombinat (Shingrix) 03/12/2020, 06/25/2020   TDAP status: Due, Education has been provided regarding the importance of this vaccine. Advised may receive this vaccine at local pharmacy or Health Dept. Aware to provide a copy of the vaccination record if obtained from local pharmacy or Health Dept. Verbalized acceptance and understanding. Deferred.   Health Maintenance Health Maintenance  Topic Date Due   COVID-19 Vaccine (4 - Booster for Moderna series) 01/24/2021 (Originally 08/31/2020)   COLONOSCOPY (Pts 45-28yr Insurance coverage will need to be confirmed)  04/27/2021 (Originally 10/08/1998)   TETANUS/TDAP  01/08/2022 (Originally 10/07/1972)   MAMMOGRAM  06/28/2021   INFLUENZA VACCINE  Completed   DEXA SCAN  Completed   Hepatitis C Screening  Completed   PNA vac Low Risk Adult  Completed   Zoster Vaccines- Shingrix  Completed   HPV VACCINES  Aged Out   Colonoscopy- initial appointment scheduled 11/8.   Lung Cancer Screening: (Low Dose CT Chest recommended if Age 67-80years, 30 pack-year currently smoking OR have quit w/in 15years.) does not qualify.   Vision Screening: Recommended annual ophthalmology exams for early detection of glaucoma and other disorders of the eye.  Dental Screening: Recommended annual dental exams for proper oral hygiene  Community Resource Referral / Chronic Care Management: CRR required this visit?  No   CCM required this visit?  No      Plan:   Keep all routine maintenance appointments.   I have personally reviewed and noted the following in the patient's chart:   Medical and  social history Use of alcohol, tobacco or illicit drugs  Current medications and supplements including opioid prescriptions. Not taking opioid.  Functional ability and status Nutritional status Physical  activity Advanced directives List of other physicians Hospitalizations, surgeries, and ER visits in previous 12 months Vitals Screenings to include cognitive, depression, and falls Referrals and appointments  In addition, I have reviewed and discussed with patient certain preventive protocols, quality metrics, and best practice recommendations. A written personalized care plan for preventive services as well as general preventive health recommendations were provided to patient via mychart.     Varney Biles, LPN   [id number]

## 2021-01-08 NOTE — Progress Notes (Signed)
Repeat calcium level is normal . No further workup needed

## 2021-01-17 ENCOUNTER — Other Ambulatory Visit: Payer: Self-pay | Admitting: Family

## 2021-02-06 ENCOUNTER — Other Ambulatory Visit: Payer: Self-pay | Admitting: Family

## 2021-02-06 DIAGNOSIS — M159 Polyosteoarthritis, unspecified: Secondary | ICD-10-CM

## 2021-02-19 ENCOUNTER — Other Ambulatory Visit: Payer: Self-pay | Admitting: Internal Medicine

## 2021-02-19 DIAGNOSIS — G2581 Restless legs syndrome: Secondary | ICD-10-CM

## 2021-03-04 ENCOUNTER — Ambulatory Visit: Payer: PPO | Admitting: Gastroenterology

## 2021-03-12 ENCOUNTER — Encounter: Payer: Self-pay | Admitting: Family

## 2021-03-12 ENCOUNTER — Other Ambulatory Visit: Payer: Self-pay

## 2021-03-12 ENCOUNTER — Ambulatory Visit (INDEPENDENT_AMBULATORY_CARE_PROVIDER_SITE_OTHER): Payer: PPO | Admitting: Family

## 2021-03-12 VITALS — BP 102/68 | HR 60 | Temp 98.3°F | Ht 64.0 in | Wt 131.6 lb

## 2021-03-12 DIAGNOSIS — R413 Other amnesia: Secondary | ICD-10-CM

## 2021-03-12 NOTE — Addendum Note (Signed)
Addended by: Leeanne Rio on: 03/12/2021 12:43 PM   Modules accepted: Orders

## 2021-03-12 NOTE — Assessment & Plan Note (Addendum)
reassuring neurologic exam. MMSE 29/30.  Discussed with patient that distraction, being overwhelmed may certainly be playing a role in forgetfulness.  Pending labs.  If symptoms were to progress, would recommend neuroimaging, neuropsychology evaluation.   will continue to monitor

## 2021-03-12 NOTE — Patient Instructions (Addendum)
Please call Dr Marius Ditch, GI, for colonoscopy to reschedule. Let me know if you need any help with scheduling.   Please let me know if any more concerns, worsening in regard to memory. We will closely monitor.

## 2021-03-12 NOTE — Progress Notes (Signed)
Subjective:    Patient ID: Michelle Walsh, female    DOB: November 11, 1955, 65 y.o.   MRN: [id number]  CC: Michelle Walsh is a 65 y.o. female who presents today for an acute visit.    HPI: Complains of memory changes for years. Describes feeling more forgetful. No sudden changes.  For years, she feels her memory has not been good.  For example she may watch a movie 1 day in the next day not able to recall major events unless prompted.  This not is serially new for her.  Necessarily No family members have noticed.  No Ha, vision changes. Appetite is good.   Still enjoys things she has always enjoyed.  Denies depression.   She walks couple of times per week.  She hasnt gotten lost or forgotten loved ones names.   Mother has dementia.   She is overwhelmed by caring for her mother who is out of state ( [place]) and feels this may be contributory. She has her daughter and her 4 kids in her home and they 'are always laughing, crying, screaming'.  She is happy that they live with her. Sleeping well.   She canceled colonoscopy with Dr Michelle Walsh for this year and plans to schedule in the new year.  No rectal bleeding. No recurrence of constipation.  She contines stool softener and miralaz daily for soft brown formed stools.   HISTORY:  Past Medical History:  Diagnosis Date   Anxiety    Chronic pain of right knee    Depression    Osteoarthritis    Restless leg syndrome    Tremors of nervous system    Vitamin D deficiency    Past Surgical History:  Procedure Laterality Date   CESAREAN SECTION     teeth all removed     Family History  Problem Relation Age of Onset   Dementia Mother 35   Seizures Father    Breast cancer Maternal Grandmother 80    Allergies: Ciprofloxacin and Codeine Current Outpatient Medications on File Prior to Visit  Medication Sig Dispense Refill   alendronate (FOSAMAX) 70 MG tablet TAKE 1 TABLET BY MOUTH EVERY 7 (SEVEN) DAYS. TAKE WITH A FULL GLASS OF WATER ON AN EMPTY  STOMACH. (Patient taking differently: Take 70 mg by mouth every Wednesday.) 12 tablet 3   bisacodyl (DULCOLAX) 5 MG EC tablet Take 2 tablets (10 mg total) by mouth at bedtime as needed for moderate constipation. 30 tablet 0   busPIRone (BUSPAR) 10 MG tablet TAKE 1 TABLET BY MOUTH TWICE A DAY 180 tablet 1   docusate sodium (COLACE) 100 MG capsule Take 100 mg by mouth 2 (two) times daily.     DULoxetine (CYMBALTA) 60 MG capsule Take 1 capsule (60 mg total) by mouth daily. 90 capsule 1   fluticasone (FLONASE) 50 MCG/ACT nasal spray Place 2 sprays into both nostrils daily as needed for allergies or rhinitis.     meloxicam (MOBIC) 7.5 MG tablet TAKE 1 TABLET BY MOUTH EVERY DAY AS NEEDED FOR PAIN (Patient taking differently: Take 7.5 mg by mouth 2 (two) times daily.) 30 tablet 2   polyethylene glycol (MIRALAX / GLYCOLAX) 17 g packet Take 17 g by mouth daily. 30 packet 2   primidone (MYSOLINE) 50 MG tablet TAKE 1 TABLET (50 MG TOTAL) BY MOUTH IN THE MORNING AND AT BEDTIME. 180 tablet 1   rOPINIRole (REQUIP) 1 MG tablet TAKE 1 AND 1/2 TABLETS BY MOUTH AT BEDTIME 45 tablet 0  rosuvastatin (CRESTOR) 10 MG tablet TAKE 1 TABLET BY MOUTH EVERY DAY 90 tablet 3   No current facility-administered medications on file prior to visit.    Social History   Tobacco Use   Smoking status: Never   Smokeless tobacco: Never  Vaping Use   Vaping Use: Some days  Substance Use Topics   Alcohol use: Never    Comment: very rare - 1 time per year   Drug use: Yes    Types: Marijuana    Comment: does it thru a vape - uses THC - "helps with anxiety"    Review of Systems  Constitutional:  Negative for chills and fever.  Respiratory:  Negative for cough.   Cardiovascular:  Negative for chest pain and palpitations.  Gastrointestinal:  Negative for nausea and vomiting.  Neurological:  Positive for tremors (chronic).     Objective:    BP 102/68 (BP Location: Left Arm, Patient Position: Sitting, Cuff Size: Normal)    Pulse 60   Temp 98.3 F (36.8 C) (Oral)   Ht 5\' 4"  (1.626 m)   Wt 131 lb 9.6 oz (59.7 kg)   SpO2 98%   BMI 22.59 kg/m    Physical Exam Vitals reviewed.  Constitutional:      Appearance: She is well-developed.  HENT:     Mouth/Throat:     Pharynx: Uvula midline.  Eyes:     Conjunctiva/sclera: Conjunctivae normal.     Pupils: Pupils are equal, round, and reactive to light.     Comments: Fundus normal bilaterally.   Cardiovascular:     Rate and Rhythm: Normal rate and regular rhythm.     Pulses: Normal pulses.     Heart sounds: Normal heart sounds.  Pulmonary:     Effort: Pulmonary effort is normal.     Breath sounds: Normal breath sounds. No wheezing, rhonchi or rales.  Skin:    General: Skin is warm and dry.  Neurological:     Mental Status: She is alert.     Cranial Nerves: No cranial nerve deficit.     Sensory: No sensory deficit.     Deep Tendon Reflexes:     Reflex Scores:      Bicep reflexes are 2+ on the right side and 2+ on the left side.      Patellar reflexes are 2+ on the right side and 2+ on the left side.    Comments: Grip equal and strong bilateral upper extremities. Gait strong and steady. Able to perform rapid alternating movement without difficulty .  bilateral fine intentional tremor in hands, neck noted.   Psychiatric:        Speech: Speech normal.        Behavior: Behavior normal.        Thought Content: Thought content normal.       Assessment & Plan:    Problem List Items Addressed This Visit       Other   Memory changes - Primary    reassuring neurologic exam. MMSE 29/30.  Discussed with patient that distraction, being overwhelmed may certainly be playing a role in forgetfulness.  Pending labs.  If symptoms were to progress, would recommend neuroimaging, neuropsychology evaluation.   will continue to monitor      Relevant Orders   CBC with Differential/Platelet   B12 and Folate Panel   RPR     I am having Michelle Walsh maintain her  fluticasone, alendronate, DULoxetine, rosuvastatin, polyethylene glycol, bisacodyl, busPIRone, docusate sodium, primidone,  meloxicam, and rOPINIRole.   No orders of the defined types were placed in this encounter.   Return precautions given.   Risks, benefits, and alternatives of the medications and treatment plan prescribed today were discussed, and patient expressed understanding.   Education regarding symptom management and diagnosis given to patient on AVS.  Continue to follow with [name], FNP for routine health maintenance.   Michelle Walsh and I agreed with plan.   [name], FNP

## 2021-03-23 ENCOUNTER — Other Ambulatory Visit: Payer: Self-pay | Admitting: Family

## 2021-03-23 DIAGNOSIS — G2581 Restless legs syndrome: Secondary | ICD-10-CM

## 2021-03-28 ENCOUNTER — Other Ambulatory Visit: Payer: Self-pay

## 2021-03-28 ENCOUNTER — Other Ambulatory Visit (INDEPENDENT_AMBULATORY_CARE_PROVIDER_SITE_OTHER): Payer: PPO

## 2021-03-28 DIAGNOSIS — R413 Other amnesia: Secondary | ICD-10-CM | POA: Diagnosis not present

## 2021-03-28 LAB — CBC WITH DIFFERENTIAL/PLATELET
Basophils Absolute: 0 10*3/uL (ref 0.0–0.1)
Basophils Relative: 0.4 % (ref 0.0–3.0)
Eosinophils Absolute: 0.1 10*3/uL (ref 0.0–0.7)
Eosinophils Relative: 1.2 % (ref 0.0–5.0)
HCT: 34.8 % — ABNORMAL LOW (ref 36.0–46.0)
Hemoglobin: 11.2 g/dL — ABNORMAL LOW (ref 12.0–15.0)
Lymphocytes Relative: 20.2 % (ref 12.0–46.0)
Lymphs Abs: 1.8 10*3/uL (ref 0.7–4.0)
MCHC: 32.3 g/dL (ref 30.0–36.0)
MCV: 91.9 fl (ref 78.0–100.0)
Monocytes Absolute: 0.5 10*3/uL (ref 0.1–1.0)
Monocytes Relative: 6 % (ref 3.0–12.0)
Neutro Abs: 6.3 10*3/uL (ref 1.4–7.7)
Neutrophils Relative %: 72.2 % (ref 43.0–77.0)
Platelets: 272 10*3/uL (ref 150.0–400.0)
RBC: 3.79 Mil/uL — ABNORMAL LOW (ref 3.87–5.11)
RDW: 14 % (ref 11.5–15.5)
WBC: 8.7 10*3/uL (ref 4.0–10.5)

## 2021-03-28 LAB — BASIC METABOLIC PANEL
BUN: 22 mg/dL (ref 6–23)
CO2: 27 mEq/L (ref 19–32)
Calcium: 9.1 mg/dL (ref 8.4–10.5)
Chloride: 101 mEq/L (ref 96–112)
Creatinine, Ser: 1.08 mg/dL (ref 0.40–1.20)
GFR: 53.17 mL/min — ABNORMAL LOW (ref >60.00)
Glucose, Bld: 84 mg/dL (ref 70–99)
Potassium: 4.1 mEq/L (ref 3.5–5.1)
Sodium: 138 mEq/L (ref 135–145)

## 2021-03-28 LAB — B12 AND FOLATE PANEL
Folate: 18.9 ng/mL (ref >5.9)
Vitamin B-12: 284 pg/mL (ref 211–911)

## 2021-03-31 LAB — RPR: RPR Ser Ql: NONREACTIVE

## 2021-04-06 ENCOUNTER — Other Ambulatory Visit: Payer: Self-pay | Admitting: Family

## 2021-04-06 DIAGNOSIS — E538 Deficiency of other specified B group vitamins: Secondary | ICD-10-CM

## 2021-04-09 ENCOUNTER — Other Ambulatory Visit: Payer: Self-pay

## 2021-04-09 ENCOUNTER — Ambulatory Visit (INDEPENDENT_AMBULATORY_CARE_PROVIDER_SITE_OTHER): Payer: PPO | Admitting: *Deleted

## 2021-04-09 DIAGNOSIS — E538 Deficiency of other specified B group vitamins: Secondary | ICD-10-CM | POA: Diagnosis not present

## 2021-04-09 MED ORDER — CYANOCOBALAMIN 1000 MCG/ML IJ SOLN
1000.0000 ug | Freq: Once | INTRAMUSCULAR | Status: AC
Start: 2021-04-09 — End: 2021-04-09
  Administered 2021-04-09: 17:00:00 1000 ug via INTRAMUSCULAR

## 2021-04-09 NOTE — Progress Notes (Signed)
Patient presented for B 12 injection to left deltoid, patient voiced no concerns nor showed any signs of distress during injection. 

## 2021-04-16 ENCOUNTER — Other Ambulatory Visit: Payer: Self-pay

## 2021-04-16 ENCOUNTER — Ambulatory Visit (INDEPENDENT_AMBULATORY_CARE_PROVIDER_SITE_OTHER): Payer: PPO

## 2021-04-16 DIAGNOSIS — E538 Deficiency of other specified B group vitamins: Secondary | ICD-10-CM

## 2021-04-16 MED ORDER — CYANOCOBALAMIN 1000 MCG/ML IJ SOLN
1000.0000 ug | Freq: Once | INTRAMUSCULAR | Status: AC
  Administered 2021-04-16: 09:00:00 1000 ug via INTRAMUSCULAR

## 2021-04-16 NOTE — Progress Notes (Signed)
Patient presented for B 12 injection to left deltoid, patient voiced no concerns nor showed any signs of distress during injection. 

## 2021-04-17 ENCOUNTER — Other Ambulatory Visit: Payer: Self-pay | Admitting: Family

## 2021-04-17 DIAGNOSIS — G2581 Restless legs syndrome: Secondary | ICD-10-CM

## 2021-04-23 ENCOUNTER — Ambulatory Visit (INDEPENDENT_AMBULATORY_CARE_PROVIDER_SITE_OTHER): Payer: PPO

## 2021-04-23 ENCOUNTER — Other Ambulatory Visit: Payer: Self-pay

## 2021-04-23 DIAGNOSIS — E538 Deficiency of other specified B group vitamins: Secondary | ICD-10-CM

## 2021-04-23 MED ORDER — CYANOCOBALAMIN 1000 MCG/ML IJ SOLN
1000.0000 ug | Freq: Once | INTRAMUSCULAR | Status: AC
  Administered 2021-04-23: 09:00:00 1000 ug via INTRAMUSCULAR

## 2021-04-23 NOTE — Progress Notes (Signed)
Patient presented for B 12 injection to right deltoid, patient voiced no concerns nor showed any signs of distress during injection. 

## 2021-04-28 ENCOUNTER — Other Ambulatory Visit: Payer: Self-pay | Admitting: Family

## 2021-04-28 DIAGNOSIS — F418 Other specified anxiety disorders: Secondary | ICD-10-CM

## 2021-04-30 ENCOUNTER — Other Ambulatory Visit: Payer: Self-pay

## 2021-04-30 ENCOUNTER — Other Ambulatory Visit (INDEPENDENT_AMBULATORY_CARE_PROVIDER_SITE_OTHER): Payer: PPO

## 2021-04-30 ENCOUNTER — Ambulatory Visit (INDEPENDENT_AMBULATORY_CARE_PROVIDER_SITE_OTHER): Payer: PPO | Admitting: *Deleted

## 2021-04-30 DIAGNOSIS — E538 Deficiency of other specified B group vitamins: Secondary | ICD-10-CM

## 2021-04-30 MED ORDER — CYANOCOBALAMIN 1000 MCG/ML IJ SOLN
1000.0000 ug | Freq: Once | INTRAMUSCULAR | Status: AC
  Administered 2021-04-30: 1000 ug via INTRAMUSCULAR

## 2021-04-30 NOTE — Progress Notes (Signed)
Patient presented for B 12 injection to right deltoid, patient voiced no concerns nor showed any signs of distress during injection. 

## 2021-04-30 NOTE — Addendum Note (Signed)
Addended by: Leeanne Rio on: 04/30/2021 09:56 AM   Modules accepted: Orders

## 2021-05-02 ENCOUNTER — Other Ambulatory Visit: Payer: Self-pay

## 2021-05-02 ENCOUNTER — Telehealth: Payer: Self-pay | Admitting: Family

## 2021-05-02 DIAGNOSIS — M159 Polyosteoarthritis, unspecified: Secondary | ICD-10-CM

## 2021-05-02 NOTE — Telephone Encounter (Signed)
Pt called stating that pharmacy needs authorization on meloxicam (MOBIC) 7.5 MG tablet

## 2021-05-02 NOTE — Telephone Encounter (Signed)
I have routed new message to [facility] in regards to this.

## 2021-05-04 LAB — INTRINSIC FACTOR ANTIBODIES: Intrinsic Factor Abs, Serum: 1.1 AU/mL (ref 0.0–1.1)

## 2021-05-04 LAB — CELIAC DISEASE AB SCREEN W/RFX
Antigliadin Abs, IgA: 3 units (ref 0–19)
IgA/Immunoglobulin A, Serum: 193 mg/dL (ref 87–352)
Transglutaminase IgA: <2 U/mL (ref 0–3)

## 2021-05-04 LAB — HOMOCYSTEINE: Homocysteine: 12.7 umol/L (ref 0.0–17.2)

## 2021-05-04 LAB — METHYLMALONIC ACID, SERUM: Methylmalonic Acid: 146 nmol/L (ref 0–378)

## 2021-05-04 LAB — ANTI-PARIETAL ANTIBODY: Parietal Cell Ab: 1.4 Units (ref 0.0–20.0)

## 2021-05-05 ENCOUNTER — Encounter: Payer: Self-pay | Admitting: Family

## 2021-05-05 DIAGNOSIS — E538 Deficiency of other specified B group vitamins: Secondary | ICD-10-CM | POA: Insufficient documentation

## 2021-05-06 ENCOUNTER — Telehealth: Payer: Self-pay | Admitting: Family

## 2021-05-06 ENCOUNTER — Other Ambulatory Visit: Payer: Self-pay | Admitting: Family

## 2021-05-06 DIAGNOSIS — M8580 Other specified disorders of bone density and structure, unspecified site: Secondary | ICD-10-CM

## 2021-05-06 NOTE — Telephone Encounter (Signed)
I called and spoke with patient advising her of Meloxicam affects as well as NSAIDS. She will do a trial of Tylenol arthritis & let us know how this works for her.

## 2021-05-06 NOTE — Telephone Encounter (Signed)
Call pt  I have concerns of patient using meloxicam 7.5 twice daily.  This medication can affect kidney and based on her last GFR ( 53 last month)  it was slightly decreased.  I would recommend trial of Tylenol arthritis.  This medication can be extremely helpful especially if scheduled.  Please advise her to stop using meloxicam and uses on a rare occasion, taken with food.  I would advise using Tylenol per below which is safer and does not affect kidneys  As discussed, let's start by scheduling Tylenol Arthritis which is a 650mg  tablet .   You may take 1-2 tablets every 8 hours ( scheduled) with maximum of 6 tablets per day.   For example , you could take two tablets in the morning ( 8am) and then two tablets again at 4pm.     We can discuss further at her visit 05/26/21 with me

## 2021-05-13 ENCOUNTER — Other Ambulatory Visit: Payer: Self-pay | Admitting: Family

## 2021-05-13 DIAGNOSIS — G2581 Restless legs syndrome: Secondary | ICD-10-CM

## 2021-05-26 ENCOUNTER — Ambulatory Visit: Payer: PPO | Admitting: Family

## 2021-06-16 ENCOUNTER — Encounter: Payer: Self-pay | Admitting: Family

## 2021-06-16 ENCOUNTER — Other Ambulatory Visit: Payer: Self-pay

## 2021-06-16 ENCOUNTER — Telehealth: Payer: Self-pay | Admitting: Family

## 2021-06-16 ENCOUNTER — Ambulatory Visit (INDEPENDENT_AMBULATORY_CARE_PROVIDER_SITE_OTHER): Payer: PPO | Admitting: Family

## 2021-06-16 VITALS — BP 130/64 | HR 63 | Temp 98.4°F | Ht 64.0 in | Wt 131.0 lb

## 2021-06-16 DIAGNOSIS — F418 Other specified anxiety disorders: Secondary | ICD-10-CM | POA: Diagnosis not present

## 2021-06-16 DIAGNOSIS — E538 Deficiency of other specified B group vitamins: Secondary | ICD-10-CM | POA: Diagnosis not present

## 2021-06-16 DIAGNOSIS — D649 Anemia, unspecified: Secondary | ICD-10-CM

## 2021-06-16 DIAGNOSIS — R413 Other amnesia: Secondary | ICD-10-CM

## 2021-06-16 LAB — CBC WITH DIFFERENTIAL/PLATELET
Basophils Absolute: 0 10*3/uL (ref 0.0–0.1)
Basophils Relative: 0.5 % (ref 0.0–3.0)
Eosinophils Absolute: 0.1 10*3/uL (ref 0.0–0.7)
Eosinophils Relative: 1.8 % (ref 0.0–5.0)
HCT: 34.8 % — ABNORMAL LOW (ref 36.0–46.0)
Hemoglobin: 11.5 g/dL — ABNORMAL LOW (ref 12.0–15.0)
Lymphocytes Relative: 25.1 % (ref 12.0–46.0)
Lymphs Abs: 2.1 10*3/uL (ref 0.7–4.0)
MCHC: 33 g/dL (ref 30.0–36.0)
MCV: 89.7 fl (ref 78.0–100.0)
Monocytes Absolute: 0.4 10*3/uL (ref 0.1–1.0)
Monocytes Relative: 5.5 % (ref 3.0–12.0)
Neutro Abs: 5.5 10*3/uL (ref 1.4–7.7)
Neutrophils Relative %: 67.1 % (ref 43.0–77.0)
Platelets: 302 10*3/uL (ref 150.0–400.0)
RBC: 3.88 Mil/uL (ref 3.87–5.11)
RDW: 13.9 % (ref 11.5–15.5)
WBC: 8.2 10*3/uL (ref 4.0–10.5)

## 2021-06-16 LAB — B12 AND FOLATE PANEL
Folate: 10.4 ng/mL (ref >5.9)
Vitamin B-12: 462 pg/mL (ref 211–911)

## 2021-06-16 NOTE — Progress Notes (Signed)
Subjective:    Patient ID: Michelle Walsh, female    DOB: 05/27/1955, 66 y.o.   MRN: [id number]  CC: Michelle Walsh is a 66 y.o. female who presents today for follow up.   HPI: Feels well today. No new complaints. Continues to get B12 injections monthly. Energy has improved.  She continues to feel forgetful with example with cooking in the oven Memory changes havent progressed.  She hasnt forgotten names or gotten lost.   GAD- compliant with cymbalta 60mg , buspar 10mg  bid.She cares of mother whom has demenita   MRI brain 01/21/19   HISTORY:  Past Medical History:  Diagnosis Date   Anxiety    Chronic pain of right knee    Depression    Osteoarthritis    Restless leg syndrome    Tremors of nervous system    Vitamin D deficiency    Past Surgical History:  Procedure Laterality Date   CESAREAN SECTION     teeth all removed     Family History  Problem Relation Age of Onset   Dementia Mother 74   Seizures Father    Breast cancer Maternal Grandmother 63    Allergies: Ciprofloxacin and Codeine Current Outpatient Medications on File Prior to Visit  Medication Sig Dispense Refill   alendronate (FOSAMAX) 70 MG tablet TAKE 1 TABLET BY MOUTH EVERY 7 (SEVEN) DAYS. TAKE WITH A FULL GLASS OF WATER ON AN EMPTY STOMACH. 12 tablet 3   bisacodyl (DULCOLAX) 5 MG EC tablet Take 2 tablets (10 mg total) by mouth at bedtime as needed for moderate constipation. 30 tablet 0   busPIRone (BUSPAR) 10 MG tablet TAKE 1 TABLET BY MOUTH TWICE A DAY 180 tablet 1   docusate sodium (COLACE) 100 MG capsule Take 100 mg by mouth 2 (two) times daily.     DULoxetine (CYMBALTA) 60 MG capsule TAKE 1 CAPSULE BY MOUTH EVERY DAY 90 capsule 1   primidone (MYSOLINE) 50 MG tablet TAKE 1 TABLET (50 MG TOTAL) BY MOUTH IN THE MORNING AND AT BEDTIME. 180 tablet 1   rOPINIRole (REQUIP) 1 MG tablet TAKE 1 AND 1/2 TABLETS BY MOUTH AT BEDTIME 45 tablet 1   rosuvastatin (CRESTOR) 10 MG tablet TAKE 1 TABLET BY MOUTH EVERY DAY 90  tablet 3   fluticasone (FLONASE) 50 MCG/ACT nasal spray Place 2 sprays into both nostrils daily as needed for allergies or rhinitis.     meloxicam (MOBIC) 7.5 MG tablet TAKE 1 TABLET BY MOUTH EVERY DAY AS NEEDED FOR PAIN (Patient taking differently: Take 7.5 mg by mouth 2 (two) times daily.) 30 tablet 2   No current facility-administered medications on file prior to visit.    Social History   Tobacco Use   Smoking status: Former    Types: Cigarettes    Quit date: 04/27/1988    Years since quitting: 33.1   Smokeless tobacco: Never  Vaping Use   Vaping Use: Some days  Substance Use Topics   Alcohol use: Never    Comment: very rare - 1 time per year   Drug use: Yes    Types: Marijuana    Comment: does it thru a vape - uses THC - "helps with anxiety"    Review of Systems  Constitutional:  Negative for chills and fever.  Respiratory:  Negative for cough.   Cardiovascular:  Negative for chest pain and palpitations.  Gastrointestinal:  Negative for nausea and vomiting.     Objective:    BP 130/64 (BP Location:  Left Arm, Patient Position: Sitting, Cuff Size: Normal)    Pulse 63    Temp 98.4 F (36.9 C) (Oral)    Ht 5\' 4"  (1.626 m)    Wt 131 lb (59.4 kg)    SpO2 97%    BMI 22.49 kg/m  BP Readings from Last 3 Encounters:  06/16/21 130/64  03/12/21 102/68  01/03/21 (!) 100/54   Wt Readings from Last 3 Encounters:  06/16/21 131 lb (59.4 kg)  03/12/21 131 lb 9.6 oz (59.7 kg)  01/08/21 130 lb (59 kg)    Physical Exam Vitals reviewed.  Constitutional:      Appearance: She is well-developed.  Eyes:     Conjunctiva/sclera: Conjunctivae normal.  Cardiovascular:     Rate and Rhythm: Normal rate and regular rhythm.     Pulses: Normal pulses.     Heart sounds: Normal heart sounds.  Pulmonary:     Effort: Pulmonary effort is normal.     Breath sounds: Normal breath sounds. No wheezing, rhonchi or rales.  Skin:    General: Skin is warm and dry.  Neurological:     Mental Status:  She is alert.  Psychiatric:        Speech: Speech normal.        Behavior: Behavior normal.        Thought Content: Thought content normal.       Assessment & Plan:   Problem List Items Addressed This Visit       Other   Anemia    Pending cbc. Telephone ( see encounter) to referral coordinator regarding status of colonscopy      Relevant Orders   CBC with Differential/Platelet   B12 deficiency - Primary    Fatigue has improved on B12 injections.  She prefers to continue B12 injections monthly.  Advised that is is safe to continue to do so.  Pending B12 today to ensure it has responded      Relevant Orders   B12 and Folate Panel   Depression with anxiety    Chronic, stable.  Continue cymbalta 60mg , buspar 10mg  bid      Memory changes    Not progressed.  She declines further evaluation including neuroimaging at this time.  Discussed behavioral tactic to address safety with cooking such as a timer.  She will let me know how she is doing and of any new concerns        I am having Michelle Walsh maintain her fluticasone, rosuvastatin, bisacodyl, busPIRone, docusate sodium, primidone, meloxicam, DULoxetine, alendronate, and rOPINIRole.   No orders of the defined types were placed in this encounter.   Return precautions given.   Risks, benefits, and alternatives of the medications and treatment plan prescribed today were discussed, and patient expressed understanding.   Education regarding symptom management and diagnosis given to patient on AVS.  Continue to follow with [name], FNP for routine health maintenance.   Michelle Walsh and I agreed with plan.   Michelle Paris, FNP

## 2021-06-16 NOTE — Assessment & Plan Note (Signed)
Pending cbc. Telephone ( see encounter) to referral coordinator regarding status of colonscopy

## 2021-06-16 NOTE — Telephone Encounter (Signed)
What is status of colonoscopy referral from last year?

## 2021-06-16 NOTE — Assessment & Plan Note (Signed)
Chronic, stable.  Continue cymbalta 60mg , buspar 10mg  bid

## 2021-06-16 NOTE — Patient Instructions (Signed)
Employ measures at home ( timer as we discussed) so you are safe when cooking.

## 2021-06-16 NOTE — Assessment & Plan Note (Signed)
Not progressed.  She declines further evaluation including neuroimaging at this time.  Discussed behavioral tactic to address safety with cooking such as a timer.  She will let me know how she is doing and of any new concerns

## 2021-06-16 NOTE — Assessment & Plan Note (Signed)
Fatigue has improved on B12 injections.  She prefers to continue B12 injections monthly.  Advised that is is safe to continue to do so.  Pending B12 today to ensure it has responded

## 2021-06-19 ENCOUNTER — Other Ambulatory Visit: Payer: Self-pay | Admitting: Family

## 2021-06-19 DIAGNOSIS — G2581 Restless legs syndrome: Secondary | ICD-10-CM

## 2021-06-22 NOTE — Telephone Encounter (Signed)
Michelle Walsh  She is interested now in colonoscopy Please sch  If new order needed, please  order jenate, and thank you  Please put reason for referral per below  COLONSCOPY NEEDED: 1.3 CM ADENOMATOUS POLYP 2015, RECENT ADMISSION FOR SBO SECONDARY TO SEVERE CONSTIPATION

## 2021-06-24 ENCOUNTER — Telehealth: Payer: Self-pay

## 2021-06-24 NOTE — Telephone Encounter (Signed)
Scheduled for 09/17/2021

## 2021-06-25 NOTE — Telephone Encounter (Signed)
noted 

## 2021-06-29 ENCOUNTER — Other Ambulatory Visit: Payer: Self-pay | Admitting: Family

## 2021-06-29 DIAGNOSIS — F418 Other specified anxiety disorders: Secondary | ICD-10-CM

## 2021-07-04 ENCOUNTER — Other Ambulatory Visit: Payer: Self-pay

## 2021-07-04 ENCOUNTER — Ambulatory Visit (INDEPENDENT_AMBULATORY_CARE_PROVIDER_SITE_OTHER): Payer: PPO | Admitting: *Deleted

## 2021-07-04 DIAGNOSIS — E538 Deficiency of other specified B group vitamins: Secondary | ICD-10-CM

## 2021-07-04 MED ORDER — CYANOCOBALAMIN 1000 MCG/ML IJ SOLN
1000.0000 ug | Freq: Once | INTRAMUSCULAR | Status: AC
  Administered 2021-07-04: 1000 ug via INTRAMUSCULAR

## 2021-07-04 NOTE — Progress Notes (Signed)
Patient presented for B 12 injection to left deltoid, patient voiced no concerns nor showed any signs of distress during injection. 

## 2021-07-13 ENCOUNTER — Other Ambulatory Visit: Payer: Self-pay | Admitting: Family

## 2021-07-13 DIAGNOSIS — G2581 Restless legs syndrome: Secondary | ICD-10-CM

## 2021-07-16 ENCOUNTER — Other Ambulatory Visit: Payer: Self-pay | Admitting: Family

## 2021-07-28 ENCOUNTER — Encounter: Payer: Self-pay | Admitting: Family Medicine

## 2021-07-28 ENCOUNTER — Ambulatory Visit (INDEPENDENT_AMBULATORY_CARE_PROVIDER_SITE_OTHER): Payer: PPO | Admitting: Family Medicine

## 2021-07-28 DIAGNOSIS — J309 Allergic rhinitis, unspecified: Secondary | ICD-10-CM | POA: Insufficient documentation

## 2021-07-28 DIAGNOSIS — J301 Allergic rhinitis due to pollen: Secondary | ICD-10-CM | POA: Diagnosis not present

## 2021-07-28 MED ORDER — AZELASTINE HCL 0.1 % NA SOLN: 2.0000 | Freq: Two times a day (BID) | NASAL | 12 refills | Status: DC

## 2021-07-28 NOTE — Progress Notes (Signed)
?Michelle Rumps, MD ?Phone: [phone] ? ?Michelle Walsh is a 66 y.o. female who presents today for f/u. ? ?Cough: Patient notes onset of symptoms 2 weeks ago.  She has postnasal drip and rhinorrhea with cough.  Notes the cough has been quite significant at times.  She notes some congestion in her nose though not anything significant.  She notes no fevers.  No shortness of breath.  No taste or smell disturbances.  She had 2 negative COVID tests early on in the illness.  She notes her symptoms get worse at night.  She was clear mucus out of her nose and cough some clear mucus up.  She was vaping for the last 3 years that she quit that as soon as she started with a cough.  She has a history of allergies.  She uses Flonase 2 sprays each nostril once daily and Claritin daily. ? ?Social History  ? ?Tobacco Use  ?Smoking Status Former  ? Types: Cigarettes  ? Quit date: 04/27/1988  ? Years since quitting: 33.2  ?Smokeless Tobacco Never  ? ? ?Current Outpatient Medications on File Prior to Visit  ?Medication Sig Dispense Refill  ? alendronate (FOSAMAX) 70 MG tablet TAKE 1 TABLET BY MOUTH EVERY 7 (SEVEN) DAYS. TAKE WITH A FULL GLASS OF WATER ON AN EMPTY STOMACH. 12 tablet 3  ? bisacodyl (DULCOLAX) 5 MG EC tablet Take 2 tablets (10 mg total) by mouth at bedtime as needed for moderate constipation. 30 tablet 0  ? busPIRone (BUSPAR) 10 MG tablet TAKE 1 TABLET BY MOUTH TWICE A DAY 180 tablet 1  ? docusate sodium (COLACE) 100 MG capsule Take 100 mg by mouth 2 (two) times daily.    ? DULoxetine (CYMBALTA) 60 MG capsule TAKE 1 CAPSULE BY MOUTH EVERY DAY 90 capsule 1  ? primidone (MYSOLINE) 50 MG tablet TAKE 1 TABLET (50 MG TOTAL) BY MOUTH IN THE MORNING AND AT BEDTIME. 180 tablet 1  ? rOPINIRole (REQUIP) 1 MG tablet TAKE 1 AND 1/2 TABLETS BY MOUTH AT BEDTIME 45 tablet 1  ? rosuvastatin (CRESTOR) 10 MG tablet TAKE 1 TABLET BY MOUTH EVERY DAY 90 tablet 3  ? ?No current facility-administered medications on file prior to visit.   ? ? ? ?ROS see history of present illness ? ?Objective ? ?Physical Exam ?Vitals:  ? 07/28/21 1518  ?BP: 120/80  ?Pulse: 88  ?Temp: 99.5 ?F (37.5 ?C)  ?SpO2: 98%  ? ? ?BP Readings from Last 3 Encounters:  ?07/28/21 120/80  ?06/16/21 130/64  ?03/12/21 102/68  ? ?Wt Readings from Last 3 Encounters:  ?07/28/21 126 lb 3.2 oz (57.2 kg)  ?06/16/21 131 lb (59.4 kg)  ?03/12/21 131 lb 9.6 oz (59.7 kg)  ? ? ?Physical Exam ?Constitutional:   ?   General: She is not in acute distress. ?   Appearance: She is not diaphoretic.  ?HENT:  ?   Mouth/Throat:  ?   Mouth: Mucous membranes are moist.  ?   Pharynx: Oropharynx is clear.  ?Eyes:  ?   Conjunctiva/sclera: Conjunctivae normal.  ?   Pupils: Pupils are equal, round, and reactive to light.  ?Cardiovascular:  ?   Rate and Rhythm: Normal rate and regular rhythm.  ?   Heart sounds: Normal heart sounds.  ?Pulmonary:  ?   Effort: Pulmonary effort is normal.  ?   Breath sounds: Normal breath sounds.  ?Lymphadenopathy:  ?   Cervical: No cervical adenopathy.  ?Skin: ?   General: Skin is warm and dry.  ?  Neurological:  ?   Mental Status: She is alert.  ? ? ? ?Assessment/Plan: Please see individual problem list. ? ?Problem List Items Addressed This Visit   ? ? Allergic rhinitis  ?  This is an exacerbation of a chronic issue.  The patient's symptoms are most likely related to allergies.  She can continue Flonase 2 sprays each nostril once daily and Claritin.  We will add Astelin 2 sprays each nostril twice daily.  If he is not improving with this she will let us know. ?  ?  ? ? ?Return if symptoms worsen or fail to improve. ? ?This visit occurred during the SARS-CoV-2 public health emergency.  Safety protocols were in place, including screening questions prior to the visit, additional usage of staff PPE, and extensive cleaning of exam room while observing appropriate contact time as indicated for disinfecting solutions.  ? ? ?Michelle Rumps, MD ?[place] ? ?

## 2021-07-28 NOTE — Assessment & Plan Note (Signed)
This is an exacerbation of a chronic issue.  The patient's symptoms are most likely related to allergies.  She can continue Flonase 2 sprays each nostril once daily and Claritin.  We will add Astelin 2 sprays each nostril twice daily.  If he is not improving with this she will let us know. ?

## 2021-07-28 NOTE — Patient Instructions (Signed)
Nice to see you. ?I think your symptoms are allergy related. ?Please start using the Astelin nasal spray in addition to your Flonase and Claritin. ?

## 2021-08-04 ENCOUNTER — Ambulatory Visit
Admission: RE | Admit: 2021-08-04 | Discharge: 2021-08-04 | Disposition: A | Discharge status: Home / Self Care | Payer: PPO | Source: Ambulatory Visit | Attending: Internal Medicine | Admitting: Internal Medicine

## 2021-08-04 ENCOUNTER — Ambulatory Visit (INDEPENDENT_AMBULATORY_CARE_PROVIDER_SITE_OTHER): Payer: PPO | Admitting: Internal Medicine

## 2021-08-04 ENCOUNTER — Encounter: Payer: Self-pay | Admitting: Internal Medicine

## 2021-08-04 VITALS — BP 138/66 | HR 111 | Temp 99.0°F | Ht 64.0 in | Wt 126.0 lb

## 2021-08-04 DIAGNOSIS — R059 Cough, unspecified: Secondary | ICD-10-CM | POA: Diagnosis not present

## 2021-08-04 MED ORDER — BENZONATATE 200 MG PO CAPS: 200.0000 mg | ORAL_CAPSULE | Freq: Three times a day (TID) | ORAL | 1 refills | Status: DC | PRN

## 2021-08-04 NOTE — Progress Notes (Signed)
? ?Subjective:  ?Patient ID: Michelle Walsh, female    DOB: 1955/05/05  Age: 66 y.o. MRN: [id number] ? ?CC: The encounter diagnosis was Cough in adult. ? ? ?This visit occurred during the SARS-CoV-2 public health emergency.  Safety protocols were in place, including screening questions prior to the visit, additional usage of staff PPE, and extensive cleaning of exam room while observing appropriate contact time as indicated for disinfecting solutions.   ? ?HPI ?Michelle Walsh presents persistent cough productive of clear sputum for the last 4 weeks   ?Chief Complaint  ?Patient presents with  ? Cough  ?  Persistent cough   ? ?66 yr old female with history of allergic rhinitis  presents with cough productive of clear sputum for the past 4 weeks   no recent travel.  History of vaping  THC for management of anxiety for the past 3 years .  Stopping vaping  1 month ago  and cough started after she stopped.  Family had strep throat but she didn't get it. No recent COVID infection .  Nose started hurting at the lower end on the right sided  when she presses on nose for the past 10 days,  no nosebleeds  ? ?Azelastine started one week ago by [facility] for cough .  No history of sneezing .  No dyspnea,  wheezing or chest pain .  Also using flonase and claritin once daily . Sleeps sitting up in the bed,  still coughing at night and sleep is disrupted by paroxysm.  Feels fine except for being sleep deprived  ? ? ?Outpatient Medications Prior to Visit  ?Medication Sig Dispense Refill  ? alendronate (FOSAMAX) 70 MG tablet TAKE 1 TABLET BY MOUTH EVERY 7 (SEVEN) DAYS. TAKE WITH A FULL GLASS OF WATER ON AN EMPTY STOMACH. 12 tablet 3  ? azelastine (ASTELIN) 0.1 % nasal spray Place 2 sprays into both nostrils 2 (two) times daily. Use in each nostril as directed 30 mL 12  ? bisacodyl (DULCOLAX) 5 MG EC tablet Take 2 tablets (10 mg total) by mouth at bedtime as needed for moderate constipation. 30 tablet 0  ? busPIRone (BUSPAR) 10 MG  tablet TAKE 1 TABLET BY MOUTH TWICE A DAY 180 tablet 1  ? docusate sodium (COLACE) 100 MG capsule Take 100 mg by mouth 2 (two) times daily.    ? DULoxetine (CYMBALTA) 60 MG capsule TAKE 1 CAPSULE BY MOUTH EVERY DAY 90 capsule 1  ? primidone (MYSOLINE) 50 MG tablet TAKE 1 TABLET (50 MG TOTAL) BY MOUTH IN THE MORNING AND AT BEDTIME. 180 tablet 1  ? rOPINIRole (REQUIP) 1 MG tablet TAKE 1 AND 1/2 TABLETS BY MOUTH AT BEDTIME 45 tablet 1  ? rosuvastatin (CRESTOR) 10 MG tablet TAKE 1 TABLET BY MOUTH EVERY DAY 90 tablet 3  ? ?No facility-administered medications prior to visit.  ? ? ?Review of Systems; ? ?Patient denies headache, fevers, malaise, unintentional weight loss, skin rash, eye pain, sinus congestion and sinus pain, sore throat, dysphagia,  hemoptysis , cough, dyspnea, wheezing, chest pain, palpitations, orthopnea, edema, abdominal pain, nausea, melena, diarrhea, constipation, flank pain, dysuria, hematuria, urinary  Frequency, nocturia, numbness, tingling, seizures,  Focal weakness, Loss of consciousness,  Tremor, insomnia, depression, anxiety, and suicidal ideation.   ? ? ? ?Objective:  ?BP 138/66 (BP Location: Left Arm, Patient Position: Sitting, Cuff Size: Normal)   Pulse (!) 111   Temp 99 ?F (37.2 ?C) (Oral)   Ht '5\' 4"'$  (1.626 m)  Wt 126 lb (57.2 kg)   SpO2 99%   BMI 21.63 kg/m?  ? ?BP Readings from Last 3 Encounters:  ?08/04/21 138/66  ?07/28/21 120/80  ?06/16/21 130/64  ? ? ?Wt Readings from Last 3 Encounters:  ?08/04/21 126 lb (57.2 kg)  ?07/28/21 126 lb 3.2 oz (57.2 kg)  ?06/16/21 131 lb (59.4 kg)  ? ? ?General appearance: alert, cooperative and appears stated age ?Ears: normal TM's and external ear canals both ears ?Throat: lips, mucosa, and tongue normal; teeth and gums normal ?Neck: no adenopathy, no carotid bruit, supple, symmetrical, trachea midline and thyroid not enlarged, symmetric, no tenderness/mass/nodules ?Back: symmetric, no curvature. ROM normal. No CVA tenderness. ?Lungs: clear to  auscultation bilaterally ?Heart: regular rate and rhythm, S1, S2 normal, no murmur, click, rub or gallop ?Abdomen: soft, non-tender; bowel sounds normal; no masses,  no organomegaly ?Pulses: 2+ and symmetric ?Skin: Skin color, texture, turgor normal. No rashes or lesions ?Lymph nodes: Cervical, supraclavicular, and axillary nodes normal. ? ?Lab Results  ?Component Value Date  ? HGBA1C 5.7 (H) 12/23/2020  ? HGBA1C 5.7 07/18/2019  ? ? ?Lab Results  ?Component Value Date  ? CREATININE 1.08 03/28/2021  ? CREATININE 0.86 12/25/2020  ? CREATININE 0.83 12/24/2020  ? ? ?Lab Results  ?Component Value Date  ? WBC 8.2 06/16/2021  ? HGB 11.5 (L) 06/16/2021  ? HCT 34.8 (L) 06/16/2021  ? PLT 302.0 06/16/2021  ? GLUCOSE 84 03/28/2021  ? CHOL 109 07/18/2019  ? TRIG 58.0 07/18/2019  ? HDL 37.00 (L) 07/18/2019  ? [place] 61 07/18/2019  ? ALT 16 12/23/2020  ? AST 21 12/23/2020  ? NA 138 03/28/2021  ? K 4.1 03/28/2021  ? CL 101 03/28/2021  ? CREATININE 1.08 03/28/2021  ? BUN 22 03/28/2021  ? CO2 27 03/28/2021  ? TSH 4.36 01/03/2021  ? INR 1.1 12/23/2020  ? HGBA1C 5.7 (H) 12/23/2020  ? ? ?CT ABDOMEN PELVIS W CONTRAST ? ?Result Date: 12/23/2020 ?CLINICAL DATA:  Non localized and worsening abdominal pain with vomiting. EXAM: CT ABDOMEN AND PELVIS WITH CONTRAST TECHNIQUE: Multidetector CT imaging of the abdomen and pelvis was performed using the standard protocol following bolus administration of intravenous contrast. CONTRAST:  98m OMNIPAQUE IOHEXOL 350 MG/ML SOLN COMPARISON:  12/18/2020. FINDINGS: Lower chest: Unremarkable. Hepatobiliary: No suspicious focal abnormality within the liver parenchyma. 3 mm hypodensity in the posterior right liver (18/2) too small to characterize but most likely benign. There is no evidence for gallstones, gallbladder wall thickening, or pericholecystic fluid. No intrahepatic or extrahepatic biliary dilation. Pancreas: No focal mass lesion. No dilatation of the main duct. No intraparenchymal cyst. No  peripancreatic edema. Spleen: No splenomegaly. No focal mass lesion. Adrenals/Urinary Tract: No adrenal nodule or mass. No suspicious renal mass. Mild fullness noted intrarenal collecting system bilaterally in each renal pelvis, progressive in the short interval since prior study. There is mild proximal right hydroureter. The urinary bladder appears normal for the degree of distention. Stomach/Bowel: Stomach is decompressed with probable fundal diverticulum. Duodenum is normally positioned as is the ligament of Treitz. No small bowel wall thickening. No small bowel dilatation. The terminal ileum is normal. The appendix is not well visualized, but there is no edema or inflammation in the region of the cecum. Cecum is markedly distended with stool, progressive in the interval now measuring 7.2 cm diameter there is pericecal edema and trace fluid associated. Prominent stool in the hepatic flexure with gaseous distention of the transverse colon up to 5 cm diameter. Splenic flexure is  distended with fluid and stool with prominent stool volume in the proximal sigmoid colon. There is some pericolonic edema associated with the proximal descending colon and there may be some mild wall thickening in the same segment. Distal sigmoid colon and rectum are decompressed. There is a relatively abrupt transition from distended sigmoid colon filled with formed stool and the decompressed distal sigmoid colon and rectum (image 67/2). Vascular/Lymphatic: There is moderate atherosclerotic calcification of the abdominal aorta without aneurysm. Hazy soft tissue attenuation is seen anterior to the aorta at the level of the IMA origin. The IMA does opacify. There is no gastrohepatic or hepatoduodenal ligament lymphadenopathy. No retroperitoneal or mesenteric lymphadenopathy. No pelvic sidewall lymphadenopathy. Reproductive: Unremarkable. Other: No intraperitoneal free fluid. Musculoskeletal: No worrisome lytic or sclerotic osseous  abnormality. IMPRESSION: 1. Markedly distended colon with stool, progressive in the interval since prior study. There is pericecal edema/fluid associated. Apparent mild wall thickening noted in the proximal descending colon with so

## 2021-08-04 NOTE — Assessment & Plan Note (Signed)
Risk factors include 3 yr history of vaping THC for management of tremor.  Continue treatment of allergic rhinitis  Add tessaln perles and send for chest x ray  ?

## 2021-08-04 NOTE — Patient Instructions (Signed)
Increase your claritin to every 12 hours ? ?Stop the azelastine for now.   Continue Flonase ? ?Tessalone perles every 8 hours for cough suppression ? ?Please go to [facility] for your chest x ray.  [facility]  ?

## 2021-08-05 ENCOUNTER — Ambulatory Visit (INDEPENDENT_AMBULATORY_CARE_PROVIDER_SITE_OTHER): Payer: PPO

## 2021-08-05 ENCOUNTER — Telehealth: Payer: Self-pay | Admitting: Family

## 2021-08-05 DIAGNOSIS — E538 Deficiency of other specified B group vitamins: Secondary | ICD-10-CM

## 2021-08-05 DIAGNOSIS — R059 Cough, unspecified: Secondary | ICD-10-CM

## 2021-08-05 MED ORDER — CYANOCOBALAMIN 1000 MCG/ML IJ SOLN
1000.0000 ug | Freq: Once | INTRAMUSCULAR | Status: AC
  Administered 2022-02-25: 1000 ug via INTRAMUSCULAR

## 2021-08-05 MED ORDER — CYANOCOBALAMIN 1000 MCG/ML IJ SOLN
1000.0000 ug | Freq: Once | INTRAMUSCULAR | Status: DC
  Administered 2021-08-05: 1000 ug via INTRAMUSCULAR

## 2021-08-05 NOTE — Progress Notes (Signed)
Patient presented for B 12 injection to left deltoid, patient voiced no concerns nor showed any signs of distress during injection. 

## 2021-08-05 NOTE — Telephone Encounter (Signed)
Pt called in stating that her and Dr. Derrel Nip has been talking about her doing a pulmonary consult. Pt stated that she in agreement with going to see a pulmonologist... Pt requesting callback...  ?

## 2021-08-07 ENCOUNTER — Telehealth: Payer: Self-pay | Admitting: Family

## 2021-08-07 ENCOUNTER — Other Ambulatory Visit: Payer: Self-pay | Admitting: Internal Medicine

## 2021-08-07 DIAGNOSIS — J441 Chronic obstructive pulmonary disease with (acute) exacerbation: Secondary | ICD-10-CM

## 2021-08-07 MED ORDER — PROMETHAZINE-PHENYLEPHRINE 6.25-5 MG/5ML PO SYRP: 5.0000 mL | ORAL_SOLUTION | Freq: Three times a day (TID) | ORAL | 0 refills | Status: DC | PRN

## 2021-08-07 MED ORDER — DM-GUAIFENESIN ER 60-1200 MG PO TB12
1.0000 | ORAL_TABLET | Freq: Two times a day (BID) | ORAL | 0 refills | Status: DC
Start: 2021-08-07 — End: 2021-09-17

## 2021-08-07 MED ORDER — PROMETHAZINE-DM 6.25-15 MG/5ML PO SYRP: 5.0000 mL | ORAL_SOLUTION | Freq: Two times a day (BID) | ORAL | 0 refills | Status: DC | PRN

## 2021-08-07 NOTE — Telephone Encounter (Signed)
Patient say the cough pearls Benzonatate 200 mg  patient says he cough non stop for an hour at a time it will stop and start back. However patient is allergic to codeine and any derivatives of codeine family causes severe nausea, ?

## 2021-08-07 NOTE — Telephone Encounter (Signed)
Pt called in stating she had an appt with tullo and she was given some medication for her cough and it is not helping. Pt want another medication called in. Pt would like be called ?

## 2021-08-07 NOTE — Telephone Encounter (Signed)
Sch appt tomorrow  ? ? ?

## 2021-08-07 NOTE — Telephone Encounter (Signed)
Non codeine related alternative sent: for cough :   contains phenergan ?

## 2021-08-07 NOTE — Telephone Encounter (Signed)
Pt is very upset that no one called her back  ?

## 2021-08-07 NOTE — Telephone Encounter (Signed)
Verbal received to call patient and schedule FU to re-evaluate and that MD was send in cough medication. ?

## 2021-08-07 NOTE — Addendum Note (Signed)
Addended by: [name] on: 08/07/2021 05:29 PM ? ? Modules accepted: Orders ? ?

## 2021-08-08 ENCOUNTER — Ambulatory Visit (INDEPENDENT_AMBULATORY_CARE_PROVIDER_SITE_OTHER): Payer: PPO | Admitting: Internal Medicine

## 2021-08-08 ENCOUNTER — Encounter: Payer: Self-pay | Admitting: Internal Medicine

## 2021-08-08 VITALS — BP 110/78 | HR 83 | Temp 99.3°F | Resp 14 | Ht 64.0 in | Wt 126.2 lb

## 2021-08-08 DIAGNOSIS — J441 Chronic obstructive pulmonary disease with (acute) exacerbation: Secondary | ICD-10-CM

## 2021-08-08 DIAGNOSIS — J449 Chronic obstructive pulmonary disease, unspecified: Secondary | ICD-10-CM

## 2021-08-08 DIAGNOSIS — J8410 Pulmonary fibrosis, unspecified: Secondary | ICD-10-CM

## 2021-08-08 MED ORDER — AZITHROMYCIN 250 MG PO TABS: ORAL_TABLET | ORAL | 0 refills | Status: AC

## 2021-08-08 MED ORDER — ALBUTEROL SULFATE HFA 108 (90 BASE) MCG/ACT IN AERS: 1.0000 | INHALATION_SPRAY | Freq: Four times a day (QID) | RESPIRATORY_TRACT | 11 refills | Status: AC | PRN

## 2021-08-08 NOTE — Patient Instructions (Addendum)
In the future can try robitussin dm or mucinex dm for cough (green label)  ?Warm tea with honey +lemon  ? ?Chronic Obstructive Pulmonary Disease Exacerbation ? ?Chronic obstructive pulmonary disease (COPD) is a long-term (chronic) condition that affects the lungs. COPD is a general term that can be used to describe many different lung problems that cause lung inflammation and limit airflow, including chronic bronchitis and emphysema. COPD exacerbations are episodes when breathing symptoms flare up, become much worse, and require extra treatment. ?COPD exacerbations are usually caused by infections. Without treatment, COPD exacerbations can be severe and even life threatening. Frequent COPD exacerbations can cause further damage to the lungs. ?What are the causes? ?This condition may be caused by: ?Respiratory infections, including viral and bacterial infections. ?Exposure to smoke. ?Exposure to air pollution, chemical fumes, or dust. ?Things that can cause an allergic reaction (allergens). ?Not taking your usual COPD medicines as directed. ?Underlying medical problems, such as congestive heart failure or infections not involving the lungs. ?In many cases, the cause of this condition is not known. ?What increases the risk? ?The following factors may make you more likely to develop this condition: ?Smoking cigarettes. ?Being an older adult. ?Having frequent prior COPD exacerbations. ?What are the signs or symptoms? ?Symptoms of this condition include: ?Increased coughing. ?Increased production of mucus from your lungs. ?Increased wheezing and shortness of breath. ?Rapid or labored breathing. ?Chest tightness. ?Less energy than usual. ?Sleep disruption from symptoms. ?Confusion ?Increased sleepiness. ?Often, these symptoms happen or get worse even with the use of medicines. ?How is this diagnosed? ?This condition is diagnosed based on: ?Your medical history. ?A physical exam. ?You may also have tests, including: ?A  chest X-ray. ?Blood tests. ?Lung (pulmonary) function tests. ?How is this treated? ?Treatment for this condition depends on the severity and cause of the symptoms. You may need to be admitted to a hospital for treatment. Some of the treatments commonly used to treat COPD exacerbations are: ?Antibiotic medicines. These may be used for severe exacerbations caused by a lung infection, such as pneumonia. ?Bronchodilators. These are inhaled medicines that expand the air passages and allow increased airflow. They may make your breathing more comfortable. ?Steroid medicines. These act to reduce inflammation in the airways. They may be given with an inhaler, taken by mouth, or given through an IV tube inserted into one of your veins. ?Supplemental oxygen therapy. ?Airway clearing techniques, such as noninvasive ventilation (NIV) and positive expiratory pressure (PEP). These provide respiratory support through a mask or other noninvasive device. An example of this would be using a continuous positive airway pressure (CPAP) machine to improve delivery of oxygen into your lungs. ?Follow these instructions at home: ?Medicines ?Take over-the-counter and prescription medicines only as told by your health care provider. ?It is important to use correct technique with inhaled medicines. ?If you were prescribed an antibiotic medicine or oral steroid, take it as told by your health care provider. Do not stop taking the medicine even if you start to feel better. ?Lifestyle ?Do not use any products that contain nicotine or tobacco. These products include cigarettes, chewing tobacco, and vaping devices, such as e-cigarettes. If you need help quitting, ask your health care provider. ?Eat a healthy diet. ?Exercise regularly. ?Get enough sleep. Most adults need 7 or more hours per night. ?Avoid exposure to all substances that irritate the airway, especially tobacco smoke. ?Regularly wash your hands with soap and water for at least 20  seconds. If soap and water are not available,  use hand sanitizer. This may help prevent you from getting infections. ?During flu season, avoid enclosed spaces that are crowded with people. ?General instructions ?Drink enough fluid to keep your urine pale yellow, unless you have a medical condition that requires fluid restriction. ?Use a cool mist vaporizer. This humidifies the air and makes it easier for you to clear your chest when you cough. ?If you have a home nebulizer and oxygen, continue to use them as told by your health care provider. ?Keep all follow-up visits. This is important. ?How is this prevented? ?Stay up-to-date on pneumococcal and flu (influenza) vaccines. A flu shot is recommended every year to help prevent exacerbations. ?Quitting smoking is very important in preventing COPD from getting worse and in preventing exacerbations from happening as often. ?Follow all instructions for pulmonary rehabilitation after a recent exacerbation. This can help prevent future exacerbations. ?Work with your health care provider to develop and follow an action plan. This tells you what steps to take when you experience certain symptoms. ?Contact a health care provider if: ?You have a worsening of your regular COPD symptoms. ?Get help right away if: ?You have worsening shortness of breath, even when resting. ?You have trouble talking. ?You have severe chest pain. ?You cough up blood. ?You have a fever. ?You have weakness, vomit repeatedly, or faint. ?You feel confused. ?You are not able to sleep because of your symptoms. ?You have trouble doing daily activities. ?These symptoms may represent a serious problem that is an emergency. Do not wait to see if the symptoms will go away. Get medical help right away. Call your local emergency services (911 in the [place] Do not drive yourself to the hospital. ?Summary ?COPD exacerbations are episodes when breathing symptoms become much worse and require extra treatment above  your normal treatment. ?Exacerbations can be severe and even life threatening. Frequent COPD exacerbations can cause further damage to your lungs. ?COPD exacerbations are usually triggered by infections such as the flu, colds, and even pneumonia. ?Treatment for this condition depends on the severity and cause of the symptoms. You may need to be admitted to a hospital for treatment. ?Quitting smoking is very important to prevent COPD from getting worse and to prevent exacerbations from happening as often. ?This information is not intended to replace advice given to you by your health care provider. Make sure you discuss any questions you have with your health care provider. ?Document Revised: 02/20/2020 Document Reviewed: 02/20/2020 ?Elsevier Patient Education ? [place]. ? ?Chronic Obstructive Pulmonary Disease ? ?Chronic obstructive pulmonary disease (COPD) is a long-term (chronic) condition that affects the lungs. COPD is a general term that can be used to describe many different lung problems that cause lung inflammation and limit airflow, including chronic bronchitis and emphysema. ?If you have COPD, your lung function will probably never return to normal. In most cases, it gets worse over time. However, there are steps you can take to slow the progression of the disease and improve your quality of life. ?What are the causes? ?This condition may be caused by: ?Smoking. This is the most common cause. ?Certain genes passed down through families. ?What increases the risk? ?The following factors may make you more likely to develop this condition: ?Being exposed to secondhand smoke from cigarettes, pipes, or cigars. ?Being exposed to chemicals and other irritants, such as fumes and dust in the work environment. ?Having chronic lung conditions or infections. ?What are the signs or symptoms? ?Symptoms of this condition include: ?Shortness of breath,  especially during physical activity. ?Chronic cough with a  large amount of thick mucus. Sometimes, the cough may not have any mucus (dry cough). ?Wheezing and rapid breathing. ?Pearline Cables or bluish discoloration (cyanosis) of the skin, especially in the fingers, toes, or lips.

## 2021-08-08 NOTE — Progress Notes (Signed)
Chief Complaint  ?Patient presents with  ? Acute Visit  ?  COPD, took dose of Promethazine last night w/relief. Cough returned this morning. Get's sob at time while coughing, cough it dry and wet at times.   ? ?F/u  ?Copd noted on xray 2 months ago stopped vaping but was coughing so bad since visit 08/04/21 tessalon not helping so yesterday I sent in promethazine dm and helped with cough went from getting 33% sleep to 66-7% sleep still with some sob with cough with clear phelgm which is new cxr +copd and lll granuloma lives with 4 grankids who had strep 3-4 weeks ago age 105, 6, 48 and 67  ? ? ?Review of Systems  ?Constitutional:  Negative for weight loss.  ?HENT:  Negative for hearing loss.   ?Eyes:  Negative for blurred vision.  ?Respiratory:  Positive for cough, sputum production and shortness of breath. Negative for wheezing.   ?Cardiovascular:  Negative for chest pain.  ?Gastrointestinal:  Negative for abdominal pain and blood in stool.  ?Genitourinary:  Negative for dysuria.  ?Musculoskeletal:  Negative for falls and joint pain.  ?Skin:  Negative for rash.  ?Neurological:  Negative for headaches.  ?Psychiatric/Behavioral:  Negative for depression.   ?Past Medical History:  ?Diagnosis Date  ? Anxiety   ? Chronic pain of right knee   ? Depression   ? Osteoarthritis   ? Restless leg syndrome   ? Tremors of nervous system   ? Vitamin D deficiency   ? ?Past Surgical History:  ?Procedure Laterality Date  ? CESAREAN SECTION    ? teeth all removed    ? ?Family History  ?Problem Relation Age of Onset  ? Dementia Mother 74  ? Seizures Father   ? Breast cancer Maternal Grandmother 82  ? ?Social History  ? ?Socioeconomic History  ? Marital status: Married  ?  Spouse name: Not on file  ? Number of children: 2  ? Years of education: 23  ? Highest education level: Not on file  ?Occupational History  ? Occupation: retired  ?Tobacco Use  ? Smoking status: Former  ?  Types: Cigarettes  ?  Quit date: 04/27/1988  ?  Years since  quitting: 33.3  ? Smokeless tobacco: Never  ?Vaping Use  ? Vaping Use: Some days  ?Substance and Sexual Activity  ? Alcohol use: Never  ?  Comment: very rare - 1 time per year  ? Drug use: Yes  ?  Types: Marijuana  ?  Comment: does it thru a vape - uses THC - "helps with anxiety"  ? Sexual activity: Not on file  ?Other Topics Concern  ? Not on file  ?Social History Narrative  ? 2 children; one child lives with her and her 4 children  ? Right handed  ? One story home  ? ?Social Determinants of Health  ? ?Financial Resource Strain: Low Risk   ? Difficulty of Paying Living Expenses: Not hard at all  ?Food Insecurity: No Food Insecurity  ? Worried About Charity fundraiser in the Last Year: Never true  ? Ran Out of Food in the Last Year: Never true  ?Transportation Needs: No Transportation Needs  ? Lack of Transportation (Medical): No  ? Lack of Transportation (Non-Medical): No  ?Physical Activity: Not on file  ?Stress: No Stress Concern Present  ? Feeling of Stress : Not at all  ?Social Connections: Unknown  ? Frequency of Communication with Friends and Family: More than three times a  week  ? Frequency of Social Gatherings with Friends and Family: More than three times a week  ? Attends Religious Services: Not on file  ? Active Member of Clubs or Organizations: Not on file  ? Attends Archivist Meetings: Not on file  ? Marital Status: Married  ?Intimate Partner Violence: Not At Risk  ? Fear of Current or Ex-Partner: No  ? Emotionally Abused: No  ? Physically Abused: No  ? Sexually Abused: No  ? ?Current Meds  ?Medication Sig  ? albuterol (VENTOLIN HFA) 108 (90 Base) MCG/ACT inhaler Inhale 1-2 puffs into the lungs every 6 (six) hours as needed for wheezing or shortness of breath.  ? alendronate (FOSAMAX) 70 MG tablet TAKE 1 TABLET BY MOUTH EVERY 7 (SEVEN) DAYS. TAKE WITH A FULL GLASS OF WATER ON AN EMPTY STOMACH.  ? azithromycin (ZITHROMAX) 250 MG tablet With food Take 2 tablets on day 1, then 1 tablet daily  on days 2 through 5  ? bisacodyl (DULCOLAX) 5 MG EC tablet Take 2 tablets (10 mg total) by mouth at bedtime as needed for moderate constipation.  ? busPIRone (BUSPAR) 10 MG tablet TAKE 1 TABLET BY MOUTH TWICE A DAY  ? Dextromethorphan-Guaifenesin 60-1200 MG 12hr tablet Take 1 tablet by mouth every 12 (twelve) hours.  ? docusate sodium (COLACE) 100 MG capsule Take 100 mg by mouth 2 (two) times daily.  ? DULoxetine (CYMBALTA) 60 MG capsule TAKE 1 CAPSULE BY MOUTH EVERY DAY  ? primidone (MYSOLINE) 50 MG tablet TAKE 1 TABLET (50 MG TOTAL) BY MOUTH IN THE MORNING AND AT BEDTIME.  ? rOPINIRole (REQUIP) 1 MG tablet TAKE 1 AND 1/2 TABLETS BY MOUTH AT BEDTIME  ? rosuvastatin (CRESTOR) 10 MG tablet TAKE 1 TABLET BY MOUTH EVERY DAY  ? ?Current Facility-Administered Medications for the 08/08/21 encounter (Office Visit) with [place]Scocuzza, Nino Glow, MD  ?Medication  ? cyanocobalamin ((VITAMIN B-12)) injection 1,000 mcg  ? ?Allergies  ?Allergen Reactions  ? Ciprofloxacin   ?  Sick with C.Diff for 2 months after taking medication  ? Codeine Nausea And Vomiting  ? ?Recent Results (from the past 2160 hour(s))  ?B12 and Folate Panel     Status: None  ? Collection Time: 06/16/21 11:28 AM  ?Result Value Ref Range  ? Vitamin B-12 462 211 - 911 pg/mL  ? Folate 10.4 >5.9 ng/mL  ?CBC with Differential/Platelet     Status: Abnormal  ? Collection Time: 06/16/21 11:28 AM  ?Result Value Ref Range  ? WBC 8.2 4.0 - 10.5 K/uL  ? RBC 3.88 3.87 - 5.11 Mil/uL  ? Hemoglobin 11.5 (L) 12.0 - 15.0 g/dL  ? HCT 34.8 (L) 36.0 - 46.0 %  ? MCV 89.7 78.0 - 100.0 fl  ? MCHC 33.0 30.0 - 36.0 g/dL  ? RDW 13.9 11.5 - 15.5 %  ? Platelets 302.0 150.0 - 400.0 K/uL  ? Neutrophils Relative % 67.1 43.0 - 77.0 %  ? Lymphocytes Relative 25.1 12.0 - 46.0 %  ? Monocytes Relative 5.5 3.0 - 12.0 %  ? Eosinophils Relative 1.8 0.0 - 5.0 %  ? Basophils Relative 0.5 0.0 - 3.0 %  ? Neutro Abs 5.5 1.4 - 7.7 K/uL  ? Lymphs Abs 2.1 0.7 - 4.0 K/uL  ? Monocytes Absolute 0.4 0.1 - 1.0  K/uL  ? Eosinophils Absolute 0.1 0.0 - 0.7 K/uL  ? Basophils Absolute 0.0 0.0 - 0.1 K/uL  ? ?Objective  ?Body mass index is 21.66 kg/m?. ?Wt Readings from Last 3  Encounters:  ?08/08/21 126 lb 3.2 oz (57.2 kg)  ?08/04/21 126 lb (57.2 kg)  ?07/28/21 126 lb 3.2 oz (57.2 kg)  ? ?Temp Readings from Last 3 Encounters:  ?08/08/21 99.3 ?F (37.4 ?C) (Oral)  ?08/04/21 99 ?F (37.2 ?C) (Oral)  ?07/28/21 99.5 ?F (37.5 ?C) (Oral)  ? ?BP Readings from Last 3 Encounters:  ?08/08/21 110/78  ?08/04/21 138/66  ?07/28/21 120/80  ? ?Pulse Readings from Last 3 Encounters:  ?08/08/21 83  ?08/04/21 (!) 111  ?07/28/21 88  ? ? ?Physical Exam ?Vitals and nursing note reviewed.  ?Constitutional:   ?   Appearance: Normal appearance. She is well-developed and well-groomed.  ?HENT:  ?   Head: Normocephalic and atraumatic.  ?Eyes:  ?   Conjunctiva/sclera: Conjunctivae normal.  ?   Pupils: Pupils are equal, round, and reactive to light.  ?Cardiovascular:  ?   Rate and Rhythm: Normal rate and regular rhythm.  ?   Heart sounds: Normal heart sounds. No murmur heard. ?Pulmonary:  ?   Effort: Pulmonary effort is normal.  ?   Breath sounds: Normal breath sounds.  ?Abdominal:  ?   General: Abdomen is flat. Bowel sounds are normal.  ?   Tenderness: There is no abdominal tenderness.  ?Musculoskeletal:     ?   General: No tenderness.  ?Skin: ?   General: Skin is warm and dry.  ?Neurological:  ?   General: No focal deficit present.  ?   Mental Status: She is alert and oriented to person, place, and time. Mental status is at baseline.  ?   Cranial Nerves: Cranial nerves 2-12 are intact.  ?   Gait: Gait is intact.  ?Psychiatric:     ?   Attention and Perception: Attention and perception normal.     ?   Mood and Affect: Mood and affect normal.     ?   Speech: Speech normal.     ?   Behavior: Behavior normal. Behavior is cooperative.     ?   Thought Content: Thought content normal.     ?   Cognition and Memory: Cognition and memory normal.     ?   Judgment:  Judgment normal.  ? ? ?Assessment  ?Plan  ?COPD exacerbation ([place]) - Plan: albuterol (VENTOLIN HFA) 108 (90 Base) MCG/ACT inhaler, azithromycin (ZITHROMAX) 250 MG tablet ?Pt plans to continue not smoking  ?No need

## 2021-08-08 NOTE — Telephone Encounter (Signed)
Message has been sent via mychart.

## 2021-08-16 ENCOUNTER — Other Ambulatory Visit: Payer: Self-pay | Admitting: Family

## 2021-08-16 DIAGNOSIS — F418 Other specified anxiety disorders: Secondary | ICD-10-CM

## 2021-08-18 ENCOUNTER — Other Ambulatory Visit: Payer: Self-pay

## 2021-08-18 ENCOUNTER — Telehealth: Payer: Self-pay | Admitting: Family

## 2021-08-18 DIAGNOSIS — G2581 Restless legs syndrome: Secondary | ICD-10-CM

## 2021-08-18 MED ORDER — ROPINIROLE HCL 1 MG PO TABS: ORAL_TABLET | ORAL | 1 refills | Status: DC

## 2021-08-18 NOTE — Telephone Encounter (Signed)
Pt called and informed that Requip was sent.  ?

## 2021-08-18 NOTE — Telephone Encounter (Signed)
Pt need a refill on rOPINIRole sent to [facility] on [address]  ?

## 2021-09-09 ENCOUNTER — Ambulatory Visit (INDEPENDENT_AMBULATORY_CARE_PROVIDER_SITE_OTHER): Payer: PPO | Admitting: *Deleted

## 2021-09-09 DIAGNOSIS — E538 Deficiency of other specified B group vitamins: Secondary | ICD-10-CM

## 2021-09-09 MED ORDER — CYANOCOBALAMIN 1000 MCG/ML IJ SOLN
1000.0000 ug | Freq: Once | INTRAMUSCULAR | Status: AC
  Administered 2021-09-09: 1000 ug via INTRAMUSCULAR

## 2021-09-09 NOTE — Progress Notes (Signed)
Patient presented for B 12 injection to right deltoid, patient voiced no concerns nor showed any signs of distress during injection. 

## 2021-09-12 ENCOUNTER — Other Ambulatory Visit: Payer: Self-pay | Admitting: Family

## 2021-09-12 DIAGNOSIS — G2581 Restless legs syndrome: Secondary | ICD-10-CM

## 2021-09-17 ENCOUNTER — Other Ambulatory Visit: Payer: Self-pay

## 2021-09-17 ENCOUNTER — Ambulatory Visit: Payer: PPO | Admitting: Gastroenterology

## 2021-09-17 ENCOUNTER — Encounter: Payer: Self-pay | Admitting: Gastroenterology

## 2021-09-17 VITALS — BP 104/60 | HR 71 | Temp 98.0°F | Ht 64.0 in | Wt 133.1 lb

## 2021-09-17 DIAGNOSIS — K5904 Chronic idiopathic constipation: Secondary | ICD-10-CM

## 2021-09-17 DIAGNOSIS — Z1211 Encounter for screening for malignant neoplasm of colon: Secondary | ICD-10-CM

## 2021-09-17 MED ORDER — NA SULFATE-K SULFATE-MG SULF 17.5-3.13-1.6 GM/177ML PO SOLN: 354.0000 mL | Freq: Once | ORAL | 0 refills | Status: AC

## 2021-09-17 NOTE — Progress Notes (Signed)
Michelle Darby, MD [address]  [place]  [place], [place] [address]  Main: [phone]  Fax: [phone]    Gastroenterology Consultation  Referring Provider:     [name], MD Primary Care Physician:  [name], FNP Primary Gastroenterologist:  Dr. Cephas Walsh Reason for Consultation:     Constipation        HPI:   Michelle Walsh is a 66 y.o. female referred by Dr. [name], FNP  for consultation & management of constipation.  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  There was no evidence of distal colonic stricture or the point of obstruction.  Since then, patient has been taking MiraLAX daily along with Colace and her bowel movements have been regular.  She is no longer experiencing constipation.  She is interested to undergo colonoscopy as her previous colonoscopy was about 10 years ago No evidence of anemia, TSH not well  NSAIDs: None  Antiplts/Anticoagulants/Anti thrombotics: None  GI Procedures: Colonoscopy about 10 years ago  Past Medical History:  Diagnosis Date   Anxiety    Chronic pain of right knee    Depression    Osteoarthritis    Restless leg syndrome    Tremors of nervous system    Vitamin D deficiency     Past Surgical History:  Procedure Laterality Date   CESAREAN SECTION     teeth all removed      Current Outpatient Medications:    albuterol (VENTOLIN HFA) 108 (90 Base) MCG/ACT inhaler, Inhale 1-2 puffs into the lungs every 6 (six) hours as needed for wheezing or shortness of breath., Disp: 18 g, Rfl: 11   alendronate (FOSAMAX) 70 MG tablet, TAKE 1 TABLET BY MOUTH  EVERY 7 (SEVEN) DAYS. TAKE WITH A FULL GLASS OF WATER ON AN EMPTY STOMACH., Disp: 12 tablet, Rfl: 3   bisacodyl (DULCOLAX) 5 MG EC tablet, Take 2 tablets (10 mg total) by mouth at bedtime as needed for moderate constipation., Disp: 30 tablet, Rfl: 0   busPIRone (BUSPAR) 10 MG tablet, TAKE 1 TABLET BY MOUTH TWICE A DAY, Disp: 180 tablet, Rfl: 1   docusate sodium (COLACE) 100 MG capsule, Take 100 mg by mouth 2 (two) times daily., Disp: , Rfl:    DULoxetine (CYMBALTA) 60 MG capsule, TAKE 1 CAPSULE BY MOUTH EVERY DAY, Disp: 90 capsule, Rfl: 1   Na Sulfate-K Sulfate-Mg Sulf 17.5-3.13-1.6 GM/177ML SOLN, Take 354 mLs by mouth once for 1 dose., Disp: 354 mL, Rfl: 0   primidone (MYSOLINE) 50 MG tablet, TAKE 1 TABLET (50 MG TOTAL) BY MOUTH IN THE MORNING AND AT BEDTIME., Disp: 180 tablet, Rfl: 1   rOPINIRole (REQUIP) 1 MG tablet, TAKE 1 AND 1/2 TABLETS BY MOUTH AT BEDTIME, Disp: 45 tablet, Rfl: 1   rosuvastatin (CRESTOR) 10 MG tablet, TAKE 1 TABLET BY MOUTH EVERY DAY, Disp: 90 tablet, Rfl: 3  Current Facility-Administered Medications:    cyanocobalamin ((VITAMIN B-12)) injection 1,000 mcg, 1,000 mcg, Intramuscular, Once, Arnett, Yvetta Coder, FNP    Family History  Problem Relation Age of Onset   Dementia Mother 80   Seizures Father    Breast cancer Maternal Grandmother 63     Social History  Tobacco Use   Smoking status: Former    Types: Cigarettes    Quit date: 04/27/1988    Years since quitting: 33.4   Smokeless tobacco: Never  Vaping Use   Vaping Use: Some days  Substance Use Topics   Alcohol use: Never    Comment: very rare - 1 time per year   Drug use: Yes    Types: Marijuana    Comment: does it thru a vape - uses THC - "helps with anxiety"    Allergies as of 09/17/2021 - Review Complete 09/17/2021  Allergen Reaction Noted   Ciprofloxacin  04/19/2019   Codeine Nausea And Vomiting 01/20/2016    Review of Systems:    All systems reviewed and negative except where noted in  HPI.   Physical Exam:  BP 104/60 (BP Location: Left Arm, Patient Position: Sitting, Cuff Size: Normal)   Pulse 71   Temp 98 F (36.7 C) (Oral)   Ht '5\' 4"'$  (1.626 m)   Wt 133 lb 2 oz (60.4 kg)   BMI 22.85 kg/m  No LMP recorded. Patient is postmenopausal.  General:   Alert,  Well-developed, well-nourished, pleasant and cooperative in NAD Head:  Normocephalic and atraumatic. Eyes:  Sclera clear, no icterus.   Conjunctiva pink. Ears:  Normal auditory acuity. Nose:  No deformity, discharge, or lesions. Mouth:  No deformity or lesions,oropharynx pink & moist. Neck:  Supple; no masses or thyromegaly. Lungs:  Respirations even and unlabored.  Clear throughout to auscultation.   No wheezes, crackles, or rhonchi. No acute distress. Heart:  Regular rate and rhythm; no murmurs, clicks, rubs, or gallops. Abdomen:  Normal bowel sounds. Soft, non-tender and non-distended without masses, hepatosplenomegaly or hernias noted.  No guarding or rebound tenderness.   Rectal: Not performed Msk:  Symmetrical without gross deformities. Good, equal movement & strength bilaterally. Pulses:  Normal pulses noted. Extremities:  No clubbing or edema.  No cyanosis. Neurologic:  Alert and oriented x3;  grossly normal neurologically. Skin:  Intact without significant lesions or rashes. No jaundice. Psych:  Alert and cooperative. Normal mood and affect.  Imaging Studies: Reviewed  Assessment and Plan:   Michelle Walsh is a 66 y.o. female with no major past medical history, history of anxiety and depression is seen in consultation for constipation.  Constipation has currently resolved by taking MiraLAX daily and having a balanced diet  Schedule screening colonoscopy  I have discussed alternative options, risks & benefits,  which include, but are not limited to, bleeding, infection, perforation,respiratory complication & drug reaction.  The patient agrees with this plan & written consent will be obtained.      Follow up as needed   Michelle Darby, MD

## 2021-09-21 ENCOUNTER — Other Ambulatory Visit: Payer: Self-pay | Admitting: Family

## 2021-09-21 DIAGNOSIS — E785 Hyperlipidemia, unspecified: Secondary | ICD-10-CM

## 2021-09-25 DIAGNOSIS — H3342 Traction detachment of retina, left eye: Secondary | ICD-10-CM | POA: Diagnosis not present

## 2021-09-25 DIAGNOSIS — H348312 Tributary (branch) retinal vein occlusion, right eye, stable: Secondary | ICD-10-CM | POA: Diagnosis not present

## 2021-09-25 DIAGNOSIS — H33012 Retinal detachment with single break, left eye: Secondary | ICD-10-CM | POA: Diagnosis not present

## 2021-09-25 DIAGNOSIS — H43821 Vitreomacular adhesion, right eye: Secondary | ICD-10-CM | POA: Diagnosis not present

## 2021-09-25 DIAGNOSIS — H43813 Vitreous degeneration, bilateral: Secondary | ICD-10-CM | POA: Diagnosis not present

## 2021-09-26 ENCOUNTER — Telehealth: Payer: Self-pay | Admitting: Gastroenterology

## 2021-09-26 NOTE — Telephone Encounter (Signed)
Patient states she has a retina detachment and has to have surgery on Monday she has to be in a certain position for a couple weeks and needs to reschedule colonoscopy. Reschedule procedure to 11/19/2021

## 2021-09-26 NOTE — Telephone Encounter (Signed)
Pt left message to cancel colonoscopy will call back and resch.

## 2021-09-29 DIAGNOSIS — H33012 Retinal detachment with single break, left eye: Secondary | ICD-10-CM | POA: Diagnosis not present

## 2021-09-29 DIAGNOSIS — H33022 Retinal detachment with multiple breaks, left eye: Secondary | ICD-10-CM | POA: Diagnosis not present

## 2021-09-30 DIAGNOSIS — H33012 Retinal detachment with single break, left eye: Secondary | ICD-10-CM | POA: Diagnosis not present

## 2021-10-07 ENCOUNTER — Other Ambulatory Visit: Payer: Self-pay | Admitting: Family

## 2021-10-07 DIAGNOSIS — G2581 Restless legs syndrome: Secondary | ICD-10-CM

## 2021-10-10 ENCOUNTER — Ambulatory Visit: Payer: PPO

## 2021-10-15 ENCOUNTER — Institutional Professional Consult (permissible substitution): Payer: PPO | Admitting: Pulmonary Disease

## 2021-10-16 ENCOUNTER — Ambulatory Visit (INDEPENDENT_AMBULATORY_CARE_PROVIDER_SITE_OTHER): Payer: PPO

## 2021-10-16 DIAGNOSIS — E538 Deficiency of other specified B group vitamins: Secondary | ICD-10-CM | POA: Diagnosis not present

## 2021-10-16 MED ORDER — CYANOCOBALAMIN 1000 MCG/ML IJ SOLN
1000.0000 ug | Freq: Once | INTRAMUSCULAR | Status: AC
  Administered 2021-10-16: 1000 ug via INTRAMUSCULAR

## 2021-10-20 ENCOUNTER — Ambulatory Visit: Payer: PPO | Admitting: Family

## 2021-10-21 ENCOUNTER — Encounter: Payer: Self-pay | Admitting: Family

## 2021-10-21 ENCOUNTER — Ambulatory Visit (INDEPENDENT_AMBULATORY_CARE_PROVIDER_SITE_OTHER): Payer: PPO | Admitting: Family

## 2021-10-21 VITALS — BP 126/78 | HR 51 | Temp 97.6°F | Ht 64.0 in | Wt 133.4 lb

## 2021-10-21 DIAGNOSIS — G2581 Restless legs syndrome: Secondary | ICD-10-CM

## 2021-10-21 DIAGNOSIS — I700 Atherosclerosis of aorta: Secondary | ICD-10-CM | POA: Diagnosis not present

## 2021-10-21 DIAGNOSIS — F418 Other specified anxiety disorders: Secondary | ICD-10-CM | POA: Diagnosis not present

## 2021-10-21 DIAGNOSIS — Z1231 Encounter for screening mammogram for malignant neoplasm of breast: Secondary | ICD-10-CM

## 2021-10-21 DIAGNOSIS — Z1211 Encounter for screening for malignant neoplasm of colon: Secondary | ICD-10-CM | POA: Diagnosis not present

## 2021-10-21 MED ORDER — ROPINIROLE HCL 2 MG PO TABS: 2.0000 mg | ORAL_TABLET | Freq: Every day | ORAL | 1 refills | Status: DC

## 2021-10-21 NOTE — Assessment & Plan Note (Signed)
Chronic,stable. Pending lipid panel.  Continue crestor 10mg .

## 2021-10-21 NOTE — Assessment & Plan Note (Signed)
Chronic, stable.  Continue cymbalta 60mg , buspar 10mg  bid

## 2021-10-30 DIAGNOSIS — H43821 Vitreomacular adhesion, right eye: Secondary | ICD-10-CM | POA: Diagnosis not present

## 2021-11-04 ENCOUNTER — Other Ambulatory Visit: Payer: Self-pay | Admitting: Family

## 2021-11-04 DIAGNOSIS — G2581 Restless legs syndrome: Secondary | ICD-10-CM

## 2021-11-18 ENCOUNTER — Ambulatory Visit (INDEPENDENT_AMBULATORY_CARE_PROVIDER_SITE_OTHER): Payer: PPO

## 2021-11-18 DIAGNOSIS — I700 Atherosclerosis of aorta: Secondary | ICD-10-CM

## 2021-11-18 DIAGNOSIS — Z1211 Encounter for screening for malignant neoplasm of colon: Secondary | ICD-10-CM | POA: Diagnosis not present

## 2021-11-18 DIAGNOSIS — E538 Deficiency of other specified B group vitamins: Secondary | ICD-10-CM

## 2021-11-18 LAB — CBC WITH DIFFERENTIAL/PLATELET
Basophils Absolute: 0 10*3/uL (ref 0.0–0.1)
Basophils Relative: 0.4 % (ref 0.0–3.0)
Eosinophils Absolute: 0.2 10*3/uL (ref 0.0–0.7)
Eosinophils Relative: 2.8 % (ref 0.0–5.0)
HCT: 37.2 % (ref 36.0–46.0)
Hemoglobin: 12 g/dL (ref 12.0–15.0)
Lymphocytes Relative: 29.9 % (ref 12.0–46.0)
Lymphs Abs: 2.3 10*3/uL (ref 0.7–4.0)
MCHC: 32.3 g/dL (ref 30.0–36.0)
MCV: 90 fl (ref 78.0–100.0)
Monocytes Absolute: 0.6 10*3/uL (ref 0.1–1.0)
Monocytes Relative: 8 % (ref 3.0–12.0)
Neutro Abs: 4.6 10*3/uL (ref 1.4–7.7)
Neutrophils Relative %: 58.9 % (ref 43.0–77.0)
Platelets: 244 10*3/uL (ref 150.0–400.0)
RBC: 4.13 Mil/uL (ref 3.87–5.11)
RDW: 15.1 % (ref 11.5–15.5)
WBC: 7.8 10*3/uL (ref 4.0–10.5)

## 2021-11-18 LAB — LIPID PANEL
Cholesterol: 137 mg/dL (ref 0–200)
HDL: 41.4 mg/dL (ref >39.00)
LDL Cholesterol: 74 mg/dL (ref 0–99)
NonHDL: 96.09
Total CHOL/HDL Ratio: 3
Triglycerides: 108 mg/dL (ref 0.0–149.0)
VLDL: 21.6 mg/dL (ref 0.0–40.0)

## 2021-11-18 MED ORDER — CYANOCOBALAMIN 1000 MCG/ML IJ SOLN
1000.0000 ug | Freq: Once | INTRAMUSCULAR | Status: AC
  Administered 2021-11-18: 1000 ug via INTRAMUSCULAR

## 2021-11-18 NOTE — Progress Notes (Signed)
Patient presented for B 12 injection to left deltoid, patient voiced no concerns nor showed any signs of distress during injection. 

## 2021-11-19 ENCOUNTER — Encounter: Admission: RE | Payer: Self-pay | Source: Home / Self Care

## 2021-11-19 ENCOUNTER — Ambulatory Visit: Admission: RE | Admit: 2021-11-19 | Payer: PPO | Source: Home / Self Care | Admitting: Gastroenterology

## 2021-11-19 SURGERY — COLONOSCOPY WITH PROPOFOL
Anesthesia: General

## 2021-11-25 IMAGING — DX DG ELBOW COMPLETE 3+V*L*
4 series · 4 of 4 positions shown · non-contrast
Comparison: None.

CLINICAL DATA: Chronic left elbow pain.  No known injury.

EXAM:
LEFT ELBOW - COMPLETE 3+ VIEW

[elbow obl (oblique) (1 of 2)]
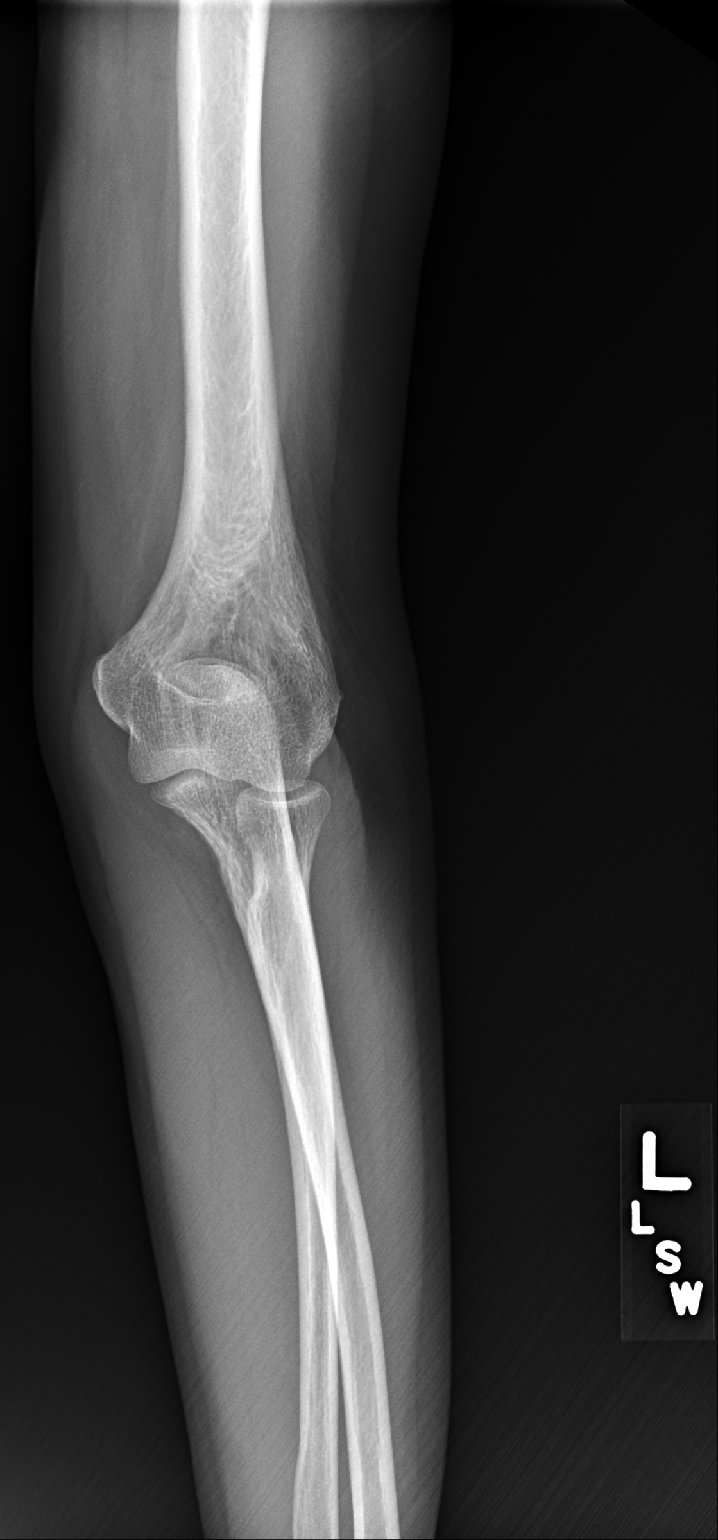

[elbow ap]
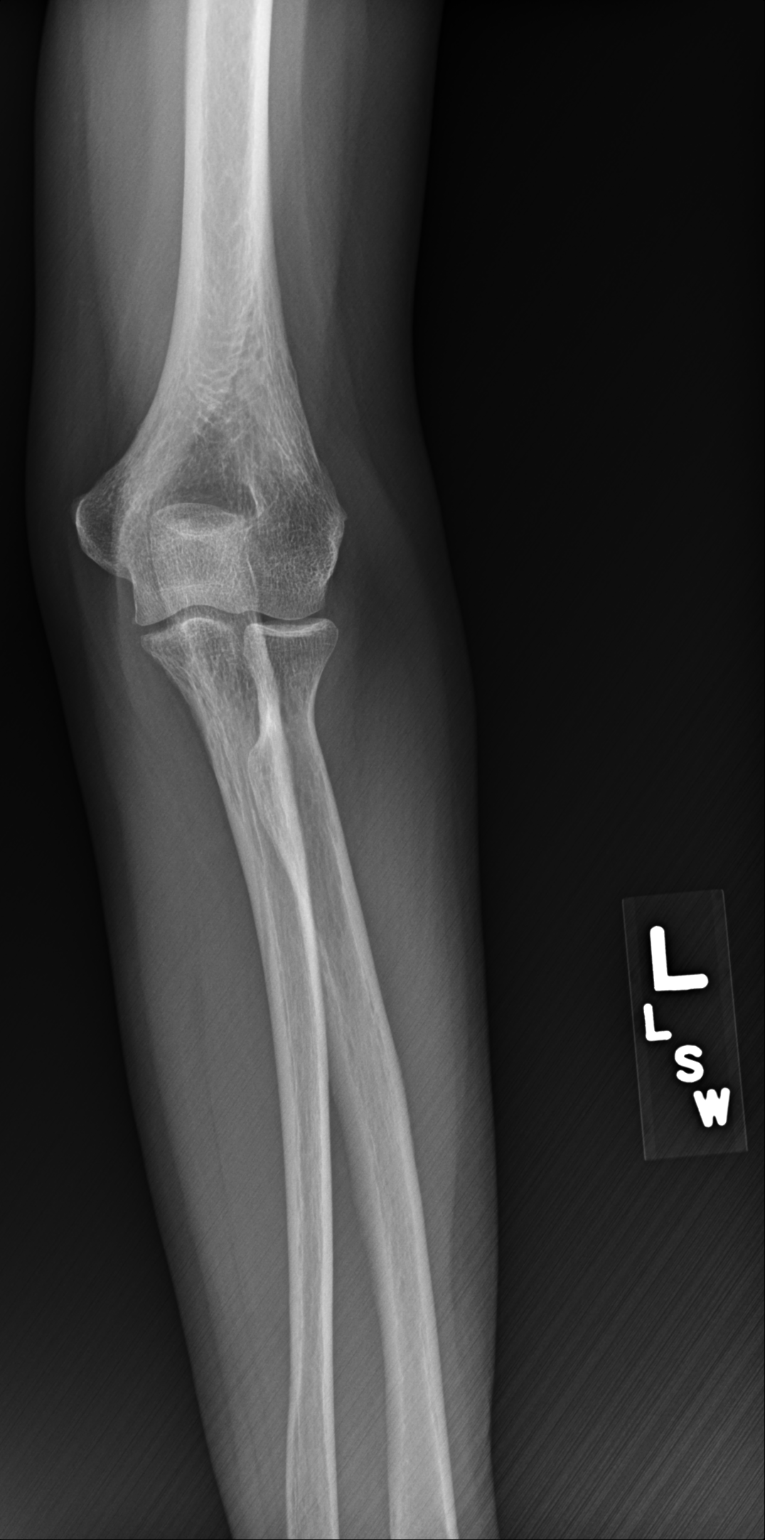

[elbow obl (oblique) (2 of 2)]
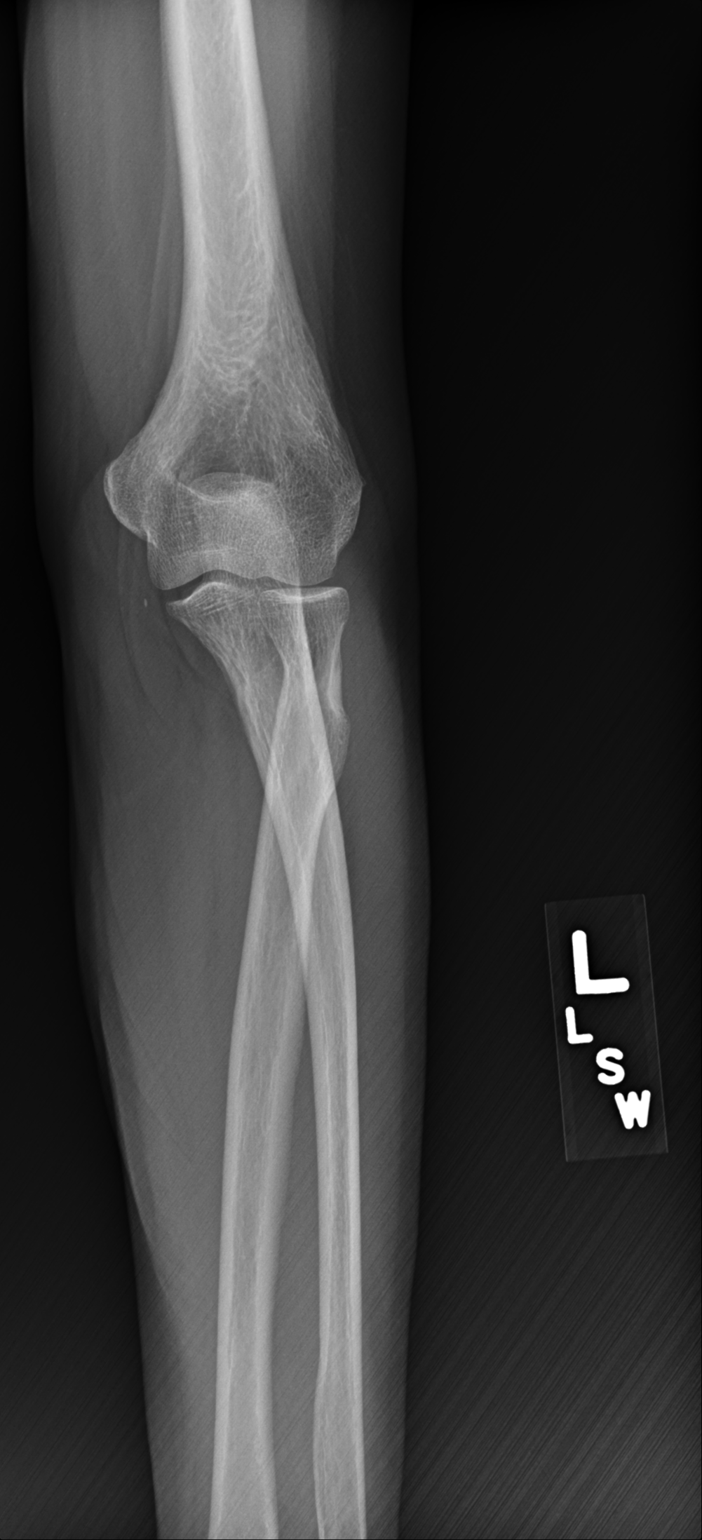

[elbow lat]
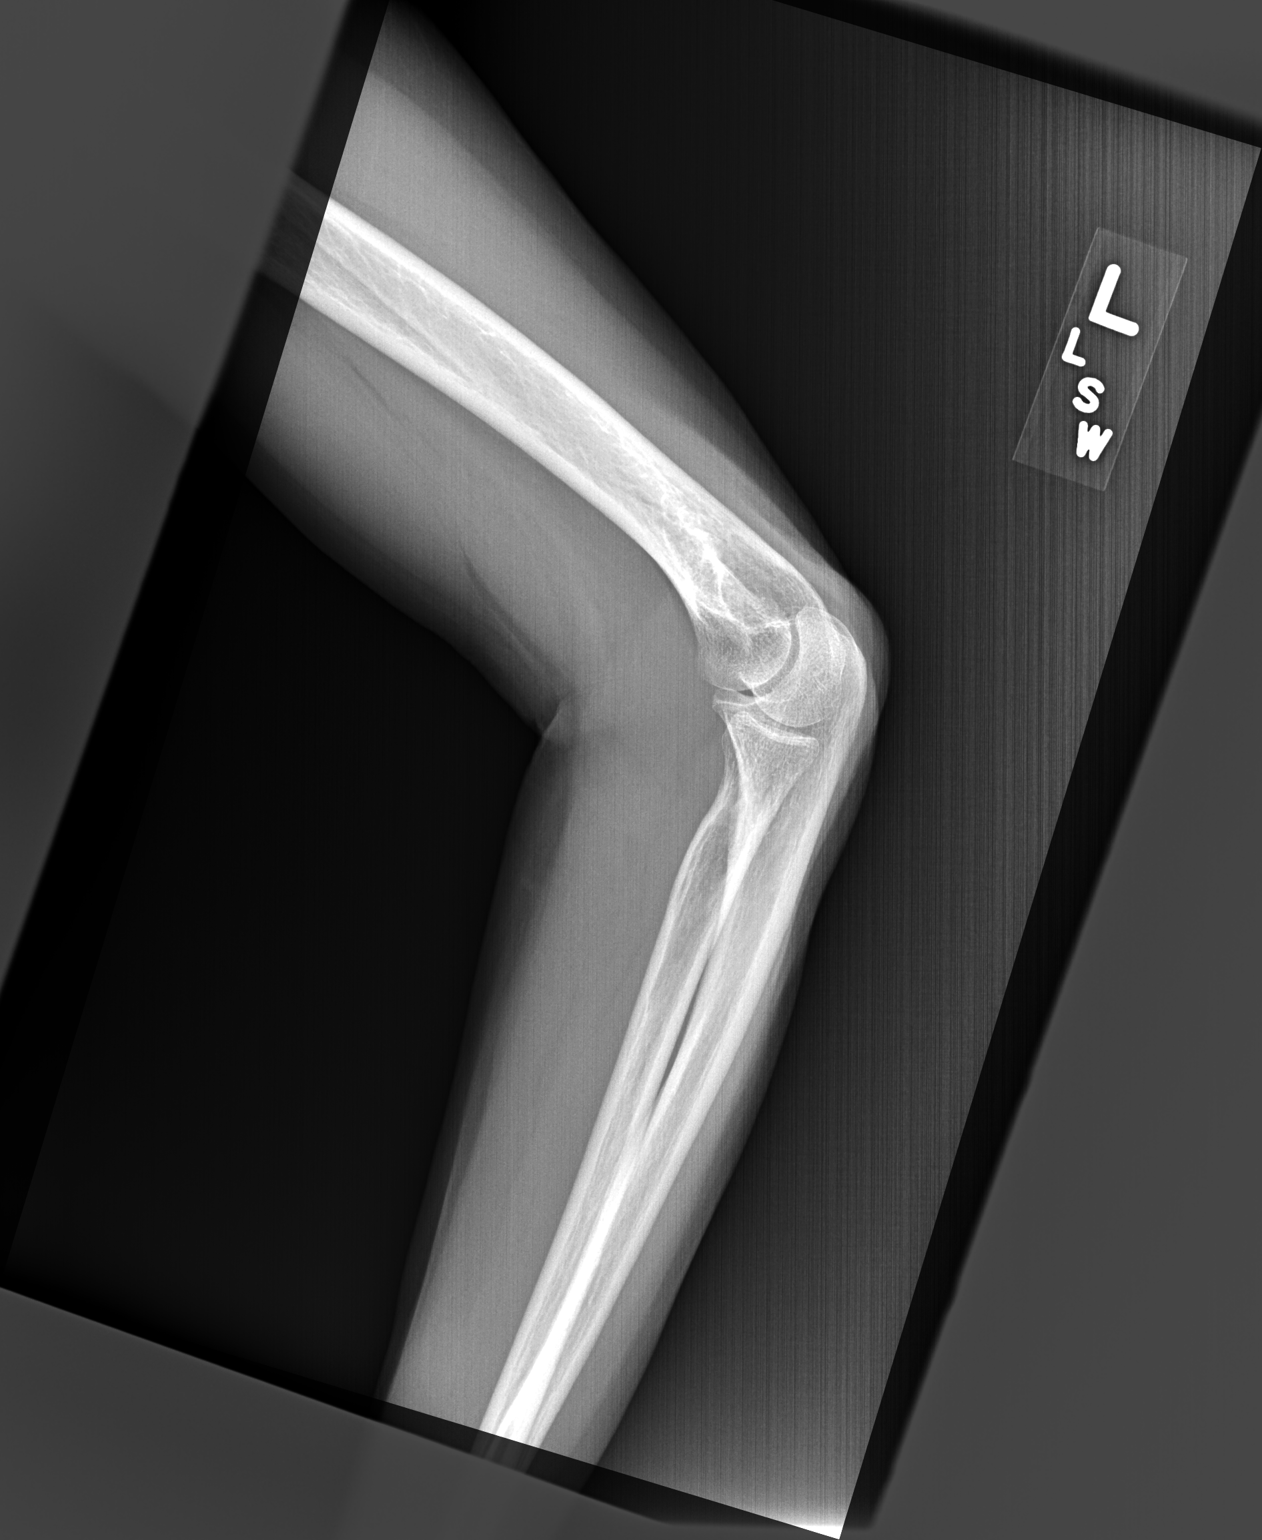

[4 of 4 positions shown; findings below may reference images not displayed]

FINDINGS: There is no evidence of fracture, dislocation, or joint effusion.
There is no evidence of arthropathy or other focal bone abnormality.
Soft tissues are unremarkable.
IMPRESSION: Negative exam.

## 2021-11-25 IMAGING — DX DG HUMERUS 2V *L*
2 series · 2 of 2 positions shown · non-contrast
Comparison: None.

CLINICAL DATA: Chronic left upper arm pain.  No known injury.

EXAM:
LEFT HUMERUS - 2+ VIEW

[humerus ap]
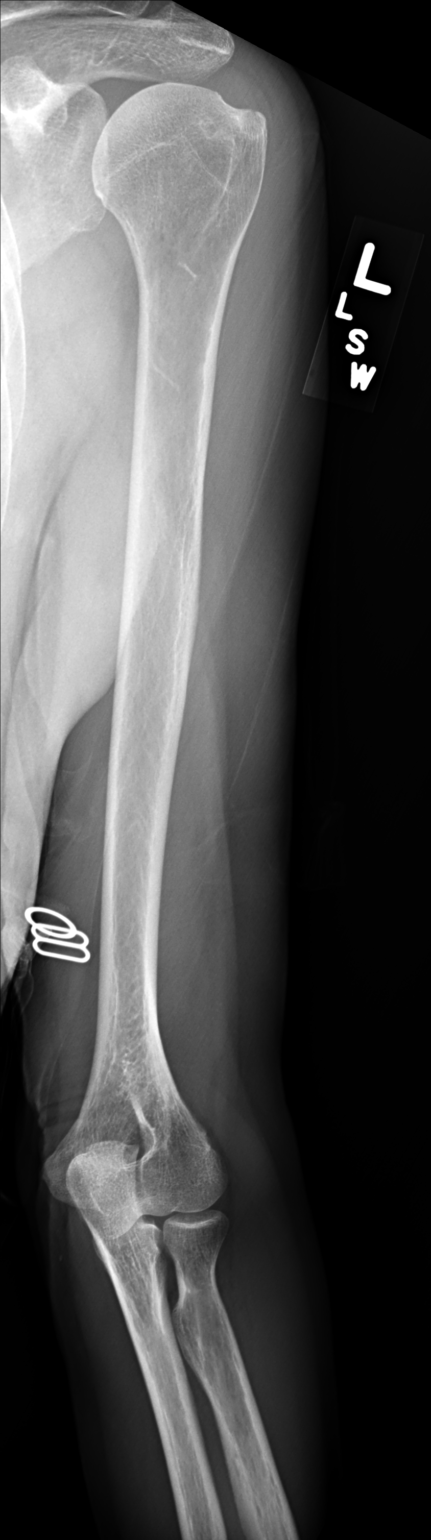

[humerus lat]
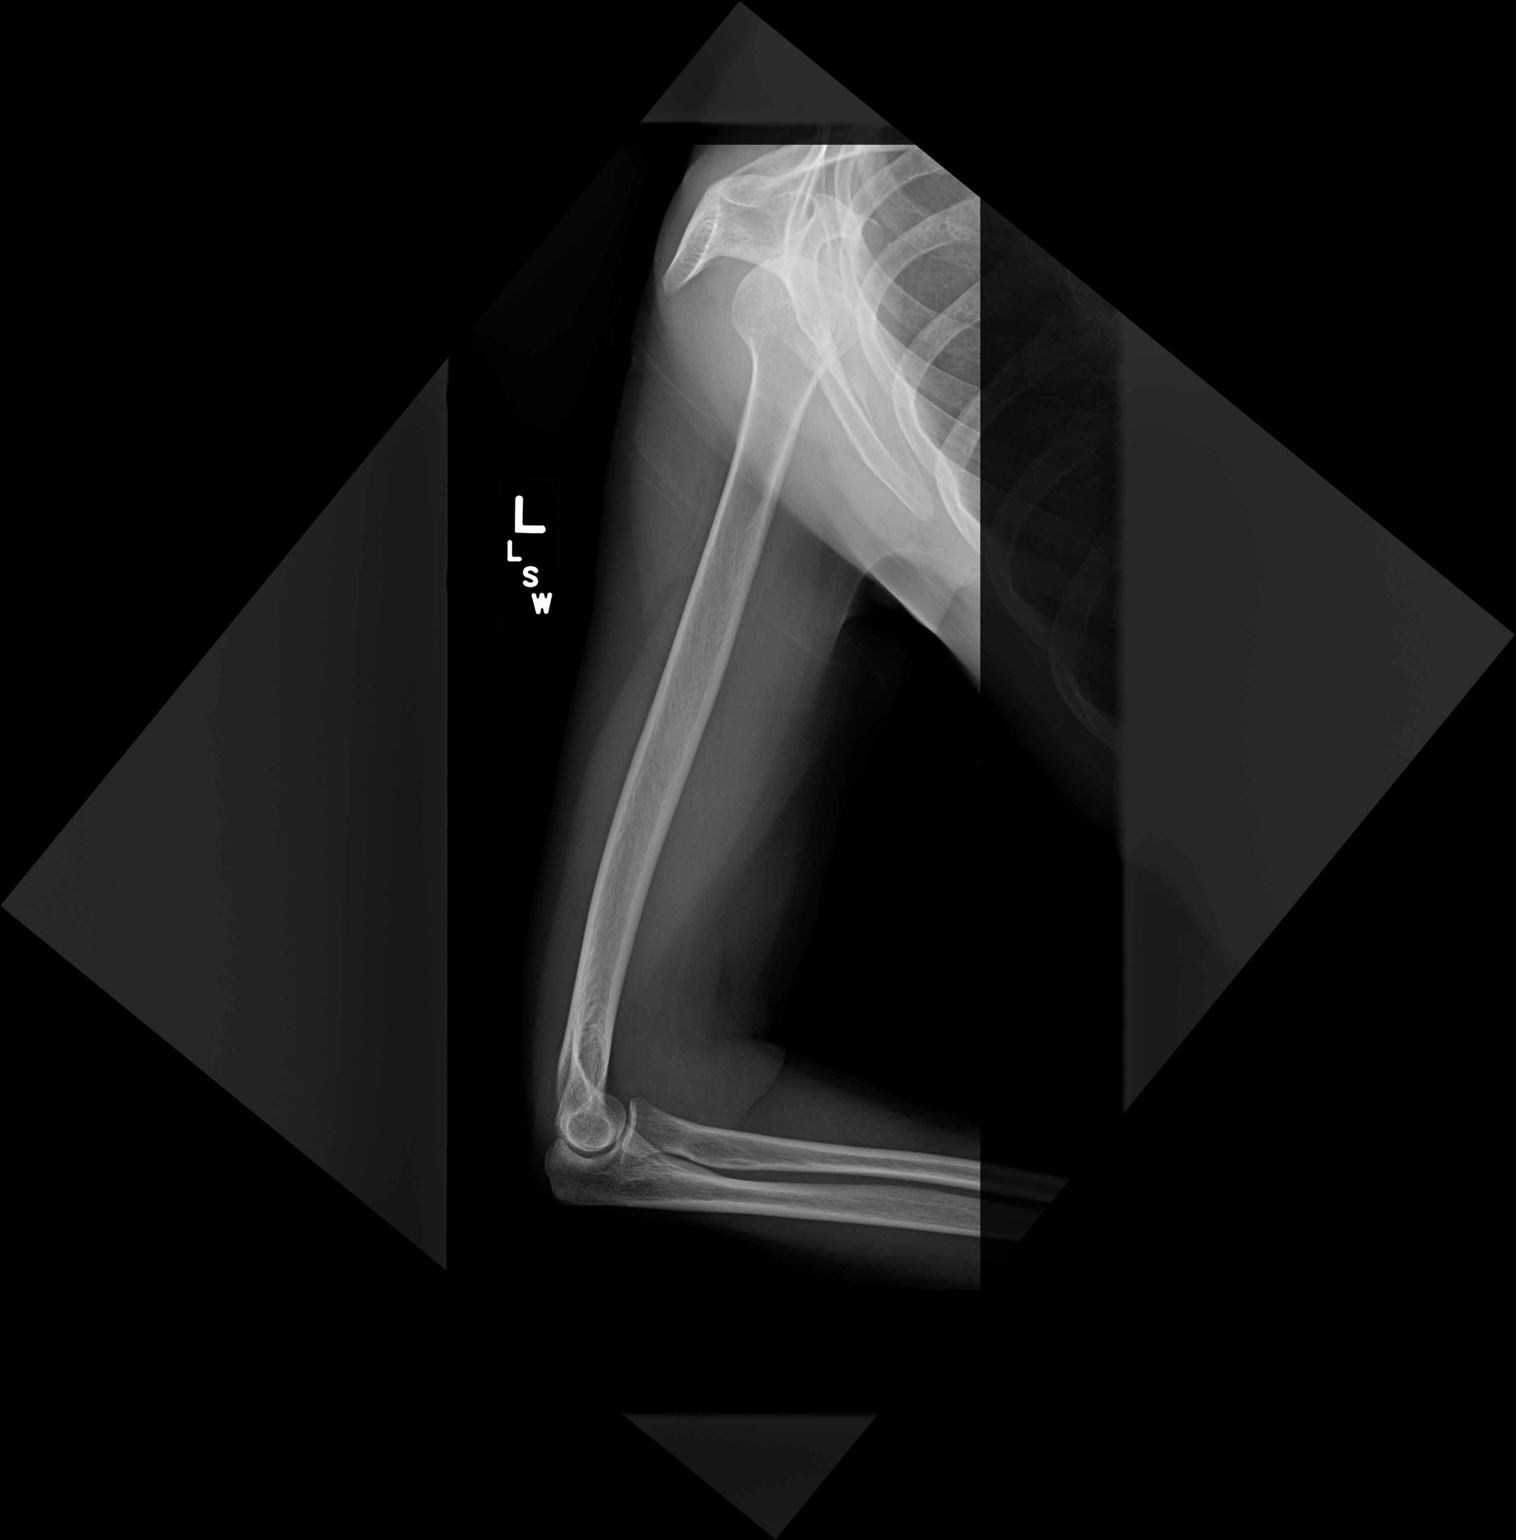

[2 of 2 positions shown; findings below may reference images not displayed]

FINDINGS: There is no evidence of fracture or other focal bone lesions. Soft
tissues are unremarkable.
IMPRESSION: Normal exam.

## 2021-11-28 DIAGNOSIS — H43821 Vitreomacular adhesion, right eye: Secondary | ICD-10-CM | POA: Diagnosis not present

## 2021-12-02 DIAGNOSIS — E785 Hyperlipidemia, unspecified: Secondary | ICD-10-CM | POA: Diagnosis not present

## 2021-12-02 DIAGNOSIS — Z87891 Personal history of nicotine dependence: Secondary | ICD-10-CM | POA: Diagnosis not present

## 2021-12-02 DIAGNOSIS — F3342 Major depressive disorder, recurrent, in full remission: Secondary | ICD-10-CM | POA: Diagnosis not present

## 2021-12-02 DIAGNOSIS — G3281 Cerebellar ataxia in diseases classified elsewhere: Secondary | ICD-10-CM | POA: Diagnosis not present

## 2021-12-02 DIAGNOSIS — M810 Age-related osteoporosis without current pathological fracture: Secondary | ICD-10-CM | POA: Diagnosis not present

## 2021-12-02 DIAGNOSIS — K5900 Constipation, unspecified: Secondary | ICD-10-CM | POA: Diagnosis not present

## 2021-12-02 DIAGNOSIS — G8929 Other chronic pain: Secondary | ICD-10-CM | POA: Diagnosis not present

## 2021-12-02 DIAGNOSIS — J449 Chronic obstructive pulmonary disease, unspecified: Secondary | ICD-10-CM | POA: Diagnosis not present

## 2021-12-02 DIAGNOSIS — F419 Anxiety disorder, unspecified: Secondary | ICD-10-CM | POA: Diagnosis not present

## 2021-12-19 ENCOUNTER — Ambulatory Visit: Payer: PPO

## 2021-12-23 ENCOUNTER — Ambulatory Visit (INDEPENDENT_AMBULATORY_CARE_PROVIDER_SITE_OTHER): Payer: PPO

## 2021-12-23 DIAGNOSIS — E538 Deficiency of other specified B group vitamins: Secondary | ICD-10-CM | POA: Diagnosis not present

## 2021-12-23 MED ORDER — CYANOCOBALAMIN 1000 MCG/ML IJ SOLN
1000.0000 ug | Freq: Once | INTRAMUSCULAR | Status: AC
  Administered 2021-12-23: 1000 ug via INTRAMUSCULAR

## 2021-12-23 NOTE — Progress Notes (Signed)
Pt presented today for b12 injection. Right deltoid, IM. Pt voiced no concerns nor showed any signs of distress.

## 2021-12-30 DIAGNOSIS — H2513 Age-related nuclear cataract, bilateral: Secondary | ICD-10-CM | POA: Diagnosis not present

## 2021-12-30 DIAGNOSIS — H2512 Age-related nuclear cataract, left eye: Secondary | ICD-10-CM | POA: Diagnosis not present

## 2021-12-30 DIAGNOSIS — H25043 Posterior subcapsular polar age-related cataract, bilateral: Secondary | ICD-10-CM | POA: Diagnosis not present

## 2021-12-30 DIAGNOSIS — H25013 Cortical age-related cataract, bilateral: Secondary | ICD-10-CM | POA: Diagnosis not present

## 2021-12-30 DIAGNOSIS — H348312 Tributary (branch) retinal vein occlusion, right eye, stable: Secondary | ICD-10-CM | POA: Diagnosis not present

## 2022-01-01 ENCOUNTER — Other Ambulatory Visit: Payer: Self-pay | Admitting: Family

## 2022-01-01 DIAGNOSIS — F418 Other specified anxiety disorders: Secondary | ICD-10-CM

## 2022-01-09 ENCOUNTER — Ambulatory Visit (INDEPENDENT_AMBULATORY_CARE_PROVIDER_SITE_OTHER): Payer: PPO

## 2022-01-09 VITALS — Ht 64.0 in | Wt 133.0 lb

## 2022-01-09 DIAGNOSIS — Z0000 Encounter for general adult medical examination without abnormal findings: Secondary | ICD-10-CM

## 2022-01-09 NOTE — Patient Instructions (Addendum)
[name] , Thank you for taking time to come for your Medicare Wellness Visit. I appreciate your ongoing commitment to your health goals. Please review the following plan we discussed and let me know if I can assist you in the future.   These are the goals we discussed:  Goals       Patient Stated     Maintain Healthy Lifestyle (pt-stated)      Stay hydrated Stay active; walk 5k-6k Healthy diet        This is a list of the screening recommended for you and due dates:  Health Maintenance  Topic Date Due   Mammogram  06/28/2021   Colon Cancer Screening  01/20/2022*   COVID-19 Vaccine (4 - Moderna series) 07/15/2022*   Flu Shot  07/26/2022*   Tetanus Vaccine  01/10/2023*   Pneumonia Vaccine  Completed   DEXA scan (bone density measurement)  Completed   Hepatitis C Screening: USPSTF Recommendation to screen - Ages 70-79 yo.  Completed   Zoster (Shingles) Vaccine  Completed   HPV Vaccine  Aged Out  *Topic was postponed. The date shown is not the original due date.    Next appointment: Follow up in one year for your annual wellness visit    Preventive Care 68 Years and Older, Female Preventive care refers to lifestyle choices and visits with your health care provider that can promote health and wellness. What does preventive care include? A yearly physical exam. This is also called an annual well check. Dental exams once or twice a year. Routine eye exams. Ask your health care provider how often you should have your eyes checked. Personal lifestyle choices, including: Daily care of your teeth and gums. Regular physical activity. Eating a healthy diet. Avoiding tobacco and drug use. Limiting alcohol use. Practicing safe sex. Taking low-dose aspirin every day. Taking vitamin and mineral supplements as recommended by your health care provider. What happens during an annual well check? The services and screenings done by your health care provider during your annual well  check will depend on your age, overall health, lifestyle risk factors, and family history of disease. Counseling  Your health care provider may ask you questions about your: Alcohol use. Tobacco use. Drug use. Emotional well-being. Home and relationship well-being. Sexual activity. Eating habits. History of falls. Memory and ability to understand (cognition). Work and work Statistician. Reproductive health. Screening  You may have the following tests or measurements: Height, weight, and BMI. Blood pressure. Lipid and cholesterol levels. These may be checked every 5 years, or more frequently if you are over 34 years old. Skin check. Lung cancer screening. You may have this screening every year starting at age 37 if you have a 30-pack-year history of smoking and currently smoke or have quit within the past 15 years. Fecal occult blood test (FOBT) of the stool. You may have this test every year starting at age 51. Flexible sigmoidoscopy or colonoscopy. You may have a sigmoidoscopy every 5 years or a colonoscopy every 10 years starting at age 35. Hepatitis C blood test. Hepatitis B blood test. Sexually transmitted disease (STD) testing. Diabetes screening. This is done by checking your blood sugar (glucose) after you have not eaten for a while (fasting). You may have this done every 1-3 years. Bone density scan. This is done to screen for osteoporosis. You may have this done starting at age 26. Mammogram. This may be done every 1-2 years. Talk to your health care provider about how often  you should have regular mammograms. Talk with your health care provider about your test results, treatment options, and if necessary, the need for more tests. Vaccines  Your health care provider may recommend certain vaccines, such as: Influenza vaccine. This is recommended every year. Tetanus, diphtheria, and acellular pertussis (Tdap, Td) vaccine. You may need a Td booster every 10 years. Zoster  vaccine. You may need this after age 63. Pneumococcal 13-valent conjugate (PCV13) vaccine. One dose is recommended after age 71. Pneumococcal polysaccharide (PPSV23) vaccine. One dose is recommended after age 74. Talk to your health care provider about which screenings and vaccines you need and how often you need them. This information is not intended to replace advice given to you by your health care provider. Make sure you discuss any questions you have with your health care provider. Document Released: 05/10/2015 Document Revised: 01/01/2016 Document Reviewed: 02/12/2015 Elsevier Interactive Patient Education  2017 [employer] Prevention in the Home Falls can cause injuries. They can happen to people of all ages. There are many things you can do to make your home safe and to help prevent falls. What can I do on the outside of my home? Regularly fix the edges of walkways and driveways and fix any cracks. Remove anything that might make you trip as you walk through a door, such as a raised step or threshold. Trim any bushes or trees on the path to your home. Use bright outdoor lighting. Clear any walking paths of anything that might make someone trip, such as rocks or tools. Regularly check to see if handrails are loose or broken. Make sure that both sides of any steps have handrails. Any raised decks and porches should have guardrails on the edges. Have any leaves, snow, or ice cleared regularly. Use sand or salt on walking paths during winter. Clean up any spills in your garage right away. This includes oil or grease spills. What can I do in the bathroom? Use night lights. Install grab bars by the toilet and in the tub and shower. Do not use towel bars as grab bars. Use non-skid mats or decals in the tub or shower. If you need to sit down in the shower, use a plastic, non-slip stool. Keep the floor dry. Clean up any water that spills on the floor as soon as it happens. Remove  soap buildup in the tub or shower regularly. Attach bath mats securely with double-sided non-slip rug tape. Do not have throw rugs and other things on the floor that can make you trip. What can I do in the bedroom? Use night lights. Make sure that you have a light by your bed that is easy to reach. Do not use any sheets or blankets that are too big for your bed. They should not hang down onto the floor. Have a firm chair that has side arms. You can use this for support while you get dressed. Do not have throw rugs and other things on the floor that can make you trip. What can I do in the kitchen? Clean up any spills right away. Avoid walking on wet floors. Keep items that you use a lot in easy-to-reach places. If you need to reach something above you, use a strong step stool that has a grab bar. Keep electrical cords out of the way. Do not use floor polish or wax that makes floors slippery. If you must use wax, use non-skid floor wax. Do not have throw rugs and other things on  the floor that can make you trip. What can I do with my stairs? Do not leave any items on the stairs. Make sure that there are handrails on both sides of the stairs and use them. Fix handrails that are broken or loose. Make sure that handrails are as long as the stairways. Check any carpeting to make sure that it is firmly attached to the stairs. Fix any carpet that is loose or worn. Avoid having throw rugs at the top or bottom of the stairs. If you do have throw rugs, attach them to the floor with carpet tape. Make sure that you have a light switch at the top of the stairs and the bottom of the stairs. If you do not have them, ask someone to add them for you. What else can I do to help prevent falls? Wear shoes that: Do not have high heels. Have rubber bottoms. Are comfortable and fit you well. Are closed at the toe. Do not wear sandals. If you use a stepladder: Make sure that it is fully opened. Do not climb a  closed stepladder. Make sure that both sides of the stepladder are locked into place. Ask someone to hold it for you, if possible. Clearly mark and make sure that you can see: Any grab bars or handrails. First and last steps. Where the edge of each step is. Use tools that help you move around (mobility aids) if they are needed. These include: Canes. Walkers. Scooters. Crutches. Turn on the lights when you go into a dark area. Replace any light bulbs as soon as they burn out. Set up your furniture so you have a clear path. Avoid moving your furniture around. If any of your floors are uneven, fix them. If there are any pets around you, be aware of where they are. Review your medicines with your doctor. Some medicines can make you feel dizzy. This can increase your chance of falling. Ask your doctor what other things that you can do to help prevent falls. This information is not intended to replace advice given to you by your health care provider. Make sure you discuss any questions you have with your health care provider. Document Released: 02/07/2009 Document Revised: 09/19/2015 Document Reviewed: 05/18/2014 Elsevier Interactive Patient Education  2017 [employer].

## 2022-01-09 NOTE — Progress Notes (Signed)
Subjective:   Michelle Walsh is a 66 y.o. female who presents for Medicare Annual (Subsequent) preventive examination.  Review of Systems    No ROS.  Medicare Wellness Virtual Visit.  Visual/audio telehealth visit, UTA vital signs.   See social history for additional risk factors.   Cardiac Risk Factors include: advanced age (>14mn, >>3women)     Objective:    Today's Vitals   01/09/22 1406  Weight: 133 lb (60.3 kg)  Height: '5\' 4"'$  (1.626 m)   Body mass index is 22.83 kg/m.     01/09/2022    2:07 PM 01/08/2021   12:51 PM 12/23/2020    8:30 PM 12/18/2020   10:44 AM 11/15/2019    3:42 PM 08/29/2019   10:08 AM 02/17/2019    9:51 AM  Advanced Directives  Does Patient Have a Medical Advance Directive? No No No No No No No  Would patient like information on creating a medical advance directive? No - Patient declined No - Patient declined No - Patient declined  No - Patient declined      Current Medications (verified) Outpatient Encounter Medications as of 01/09/2022  Medication Sig   albuterol (VENTOLIN HFA) 108 (90 Base) MCG/ACT inhaler Inhale 1-2 puffs into the lungs every 6 (six) hours as needed for wheezing or shortness of breath.   alendronate (FOSAMAX) 70 MG tablet TAKE 1 TABLET BY MOUTH EVERY 7 (SEVEN) DAYS. TAKE WITH A FULL GLASS OF WATER ON AN EMPTY STOMACH.   bisacodyl (DULCOLAX) 5 MG EC tablet Take 2 tablets (10 mg total) by mouth at bedtime as needed for moderate constipation.   busPIRone (BUSPAR) 10 MG tablet TAKE 1 TABLET BY MOUTH TWICE A DAY   docusate sodium (COLACE) 100 MG capsule Take 100 mg by mouth 2 (two) times daily.   DULoxetine (CYMBALTA) 60 MG capsule TAKE 1 CAPSULE BY MOUTH EVERY DAY   primidone (MYSOLINE) 50 MG tablet TAKE 1 TABLET (50 MG TOTAL) BY MOUTH IN THE MORNING AND AT BEDTIME.   rOPINIRole (REQUIP) 2 MG tablet Take 1 tablet (2 mg total) by mouth at bedtime.   rosuvastatin (CRESTOR) 10 MG tablet TAKE 1 TABLET BY MOUTH EVERY DAY    Facility-Administered Encounter Medications as of 01/09/2022  Medication   cyanocobalamin ((VITAMIN B-12)) injection 1,000 mcg    Allergies (verified) Ciprofloxacin and Codeine   History: Past Medical History:  Diagnosis Date   Anxiety    Chronic pain of right knee    Depression    Osteoarthritis    Restless leg syndrome    Tremors of nervous system    Vitamin D deficiency    Past Surgical History:  Procedure Laterality Date   CESAREAN SECTION     teeth all removed     Family History  Problem Relation Age of Onset   Dementia Mother 835  Seizures Father    Breast cancer Maternal Grandmother 755  Social History   Socioeconomic History   Marital status: Married    Spouse name: Not on file   Number of children: 2   Years of education: 12   Highest education level: Not on file  Occupational History   Occupation: retired  Tobacco Use   Smoking status: Former    Types: Cigarettes    Quit date: 04/27/1988    Years since quitting: 33.7   Smokeless tobacco: Never  Vaping Use   Vaping Use: Some days  Substance and Sexual Activity   Alcohol use: Never  Comment: very rare - 1 time per year   Drug use: Yes    Types: Marijuana    Comment: does it thru a vape - uses THC - "helps with anxiety"   Sexual activity: Not on file  Other Topics Concern   Not on file  Social History Narrative   2 children; one child lives with her and her 4 children   Right handed   One story home   Social Determinants of Health   Financial Resource Strain: Low Risk  (01/09/2022)   Overall Financial Resource Strain (CARDIA)    Difficulty of Paying Living Expenses: Not hard at all  Food Insecurity: No Food Insecurity (01/09/2022)   Hunger Vital Sign    Worried About Running Out of Food in the Last Year: Never true    [place] in the Last Year: Never true  Transportation Needs: No Transportation Needs (01/09/2022)   PRAPARE - Hydrologist (Medical): No     Lack of Transportation (Non-Medical): No  Physical Activity: Not on file  Stress: No Stress Concern Present (01/09/2022)   [place]    Feeling of Stress : Not at all  Social Connections: Unknown (01/09/2022)   Social Connection and Isolation Panel [NHANES]    Frequency of Communication with Friends and Family: More than three times a week    Frequency of Social Gatherings with Friends and Family: More than three times a week    Attends Religious Services: Not on Advertising copywriter or Organizations: Not on file    Attends Archivist Meetings: Not on file    Marital Status: Married    Tobacco Counseling Counseling given: Not Answered   Clinical Intake:  Pre-visit preparation completed: Yes        Diabetes: No  How often do you need to have someone help you when you read instructions, pamphlets, or other written materials from your doctor or pharmacy?: 1 - Never   Interpreter Needed?: No      Activities of Daily Living    01/09/2022    2:14 PM  In your present state of health, do you have any difficulty performing the following activities:  Hearing? 0  Vision? 0  Difficulty concentrating or making decisions? 0  Walking or climbing stairs? 0  Dressing or bathing? 0  Doing errands, shopping? 0  Preparing Food and eating ? N  Using the Toilet? N  In the past six months, have you accidently leaked urine? N  Do you have problems with loss of bowel control? N  Managing your Medications? N  Managing your Finances? N  Housekeeping or managing your Housekeeping? N    Patient Care Team: [name], FNP as PCP - General (Family Medicine) Rico Junker, RN as Registered Nurse Theodore Demark, RN (Inactive) as Registered Nurse Tat, Eustace Quail, DO as Consulting Physician (Neurology)  Indicate any recent Medical Services you may have received from other than [facility] providers  in the past year (date may be approximate).     Assessment:   This is a routine wellness examination for Michelle Walsh.  Virtual Visit via Telephone Note  I connected with  Michelle Walsh on 01/09/22 at  2:00 PM EDT by telephone and verified that I am speaking with the correct person using two identifiers.  Location: Patient: home Provider: office Persons participating in the virtual visit: patient/Nurse Health  Advisor   I discussed the limitations of performing an evaluation and management service by telehealth. We continued and completed visit with audio only. Some vital signs may be absent or patient reported.   Hearing/Vision screen Hearing Screening - Comments:: Patient is able to hear conversational tones without difficulty. No issues reported. Vision Screening - Comments:: Followed by [facility]  Wears corrective lenses They have seen their ophthalmologist in the last 12 months  Dietary issues and exercise activities discussed: Current Exercise Habits: Home exercise routine, Type of exercise: walking, Intensity: Mild Healthy diet Good water intake   Goals Addressed               This Visit's Progress     Patient Stated     Maintain Healthy Lifestyle (pt-stated)        Stay hydrated Stay active; walk 5k-6k Healthy diet       Depression Screen    01/09/2022    2:12 PM 10/21/2021   10:39 AM 08/08/2021    9:38 AM 08/04/2021   10:07 AM 07/28/2021    3:19 PM 01/08/2021   12:50 PM 01/03/2021    8:40 AM  PHQ 2/9 Scores  PHQ - 2 Score 0 0 0 0 0 0 0  PHQ- 9 Score    0  0 0    Fall Risk    01/09/2022    2:13 PM 10/21/2021   10:38 AM 08/08/2021    9:38 AM 08/04/2021   10:07 AM 07/28/2021    3:19 PM  Fall Risk   Falls in the past year? 0 1 0 0 0  Number falls in past yr: 0 0 0  0  Injury with Fall? 0 0 0  0  Risk for fall due to : No Fall Risks History of fall(s) No Fall Risks No Fall Risks No Fall Risks  Follow up Falls evaluation completed Falls evaluation completed Falls  evaluation completed Falls evaluation completed Falls evaluation completed    [place]: Home free of loose throw rugs in walkways, pet beds, electrical cords, etc? Yes  Adequate lighting in your home to reduce risk of falls? Yes   ASSISTIVE DEVICES UTILIZED TO PREVENT FALLS: Life alert? No  Use of a cane, walker or w/c? No  Grab bars in the bathroom? No  Shower chair or bench in shower? Yes  Elevated toilet seat or a handicapped toilet? No   TIMED UP AND GO: Was the test performed? No .   Cognitive Function:    03/12/2021   12:29 PM  MMSE - Mini Mental State Exam  Orientation to time 5  Orientation to Place 5  Registration 3  Attention/ Calculation 5  Recall 2  Language- name 2 objects 2  Language- repeat 1  Language- follow 3 step command 3  Language- read & follow direction 1  Write a sentence 1  Copy design 1  Total score 29        01/09/2022    2:17 PM 11/15/2019    3:50 PM  6CIT Screen  What Year? 0 points 0 points  What month? 0 points 0 points  What time? 0 points   Count back from 20 0 points   Months in reverse 0 points 0 points  Repeat phrase 0 points 0 points  Total Score 0 points     Immunizations Immunization History  Administered Date(s) Administered   Fluad Quad(high Dose 65+) 02/13/2020, 01/03/2021   Influenza, High Dose Seasonal  PF 03/02/2019   Moderna Sars-Covid-2 Vaccination 07/17/2019, 08/14/2019, 05/03/2020   Pneumococcal Conjugate-13 11/17/2018   Pneumococcal Polysaccharide-23 12/25/2019   Zoster Recombinat (Shingrix) 03/12/2020, 06/25/2020   TDAP status: Due, Education has been provided regarding the importance of this vaccine. Advised may receive this vaccine at local pharmacy or Health Dept. Aware to provide a copy of the vaccination record if obtained from local pharmacy or Health Dept. Verbalized acceptance and understanding.  Flu Vaccine status: Due, Education has been provided regarding the  importance of this vaccine. Advised may receive this vaccine at local pharmacy or Health Dept. Aware to provide a copy of the vaccination record if obtained from local pharmacy or Health Dept. Verbalized acceptance and understanding.  Screening Tests Health Maintenance  Topic Date Due   MAMMOGRAM  06/28/2021   COLONOSCOPY (Pts 45-74yr Insurance coverage will need to be confirmed)  01/20/2022 (Originally 10/08/1998)   COVID-19 Vaccine (4 - Moderna series) 07/15/2022 (Originally 06/28/2020)   INFLUENZA VACCINE  07/26/2022 (Originally 11/25/2021)   TETANUS/TDAP  01/10/2023 (Originally 10/07/1974)   Pneumonia Vaccine 68 Years old  Completed   DEXA SCAN  Completed   Hepatitis C Screening  Completed   Zoster Vaccines- Shingrix  Completed   HPV VACCINES  Aged Out   Health Maintenance Health Maintenance Due  Topic Date Due   MAMMOGRAM  06/28/2021   Lung Cancer Screening: (Low Dose CT Chest recommended if Age 68-80years, 30 pack-year currently smoking OR have quit w/in 15years.) does not qualify.   Vision Screening: Recommended annual ophthalmology exams for early detection of glaucoma and other disorders of the eye.  Dental Screening: Recommended annual dental exams for proper oral hygiene  Community Resource Referral / Chronic Care Management: CRR required this visit?  No   CCM required this visit?  No      Plan:     I have personally reviewed and noted the following in the patient's chart:   Medical and social history Use of alcohol, tobacco or illicit drugs  Current medications and supplements including opioid prescriptions. Patient is not currently taking opioid prescriptions. Functional ability and status Nutritional status Physical activity Advanced directives List of other physicians Hospitalizations, surgeries, and ER visits in previous 12 months Vitals Screenings to include cognitive, depression, and falls Referrals and appointments  In addition, I have reviewed and  discussed with patient certain preventive protocols, quality metrics, and best practice recommendations. A written personalized care plan for preventive services as well as general preventive health recommendations were provided to patient.     OVarney Biles LPN   [id number]

## 2022-01-12 DIAGNOSIS — Z48810 Encounter for surgical aftercare following surgery on the sense organs: Secondary | ICD-10-CM | POA: Diagnosis not present

## 2022-01-12 DIAGNOSIS — H2512 Age-related nuclear cataract, left eye: Secondary | ICD-10-CM | POA: Diagnosis not present

## 2022-01-12 DIAGNOSIS — T85398A Other mechanical complication of other ocular prosthetic devices, implants and grafts, initial encounter: Secondary | ICD-10-CM | POA: Diagnosis not present

## 2022-01-12 DIAGNOSIS — Z8669 Personal history of other diseases of the nervous system and sense organs: Secondary | ICD-10-CM | POA: Diagnosis not present

## 2022-01-12 DIAGNOSIS — H43392 Other vitreous opacities, left eye: Secondary | ICD-10-CM | POA: Diagnosis not present

## 2022-01-21 ENCOUNTER — Ambulatory Visit: Payer: PPO | Admitting: Family

## 2022-02-15 ENCOUNTER — Other Ambulatory Visit: Payer: Self-pay | Admitting: Family

## 2022-02-15 DIAGNOSIS — M8580 Other specified disorders of bone density and structure, unspecified site: Secondary | ICD-10-CM

## 2022-02-17 DIAGNOSIS — H2511 Age-related nuclear cataract, right eye: Secondary | ICD-10-CM | POA: Diagnosis not present

## 2022-02-18 DIAGNOSIS — H43821 Vitreomacular adhesion, right eye: Secondary | ICD-10-CM | POA: Diagnosis not present

## 2022-02-19 DIAGNOSIS — F3341 Major depressive disorder, recurrent, in partial remission: Secondary | ICD-10-CM | POA: Diagnosis not present

## 2022-02-19 DIAGNOSIS — Z9181 History of falling: Secondary | ICD-10-CM | POA: Diagnosis not present

## 2022-02-25 ENCOUNTER — Encounter: Payer: Self-pay | Admitting: Family

## 2022-02-25 ENCOUNTER — Other Ambulatory Visit: Payer: Self-pay

## 2022-02-25 ENCOUNTER — Telehealth: Payer: Self-pay

## 2022-02-25 ENCOUNTER — Ambulatory Visit (INDEPENDENT_AMBULATORY_CARE_PROVIDER_SITE_OTHER): Payer: PPO | Admitting: Family

## 2022-02-25 VITALS — BP 118/70 | HR 79 | Temp 98.4°F | Ht 64.0 in | Wt 133.6 lb

## 2022-02-25 DIAGNOSIS — M8580 Other specified disorders of bone density and structure, unspecified site: Secondary | ICD-10-CM

## 2022-02-25 DIAGNOSIS — F418 Other specified anxiety disorders: Secondary | ICD-10-CM | POA: Diagnosis not present

## 2022-02-25 DIAGNOSIS — Z23 Encounter for immunization: Secondary | ICD-10-CM

## 2022-02-25 DIAGNOSIS — Z1211 Encounter for screening for malignant neoplasm of colon: Secondary | ICD-10-CM

## 2022-02-25 DIAGNOSIS — Z1231 Encounter for screening mammogram for malignant neoplasm of breast: Secondary | ICD-10-CM | POA: Diagnosis not present

## 2022-02-25 DIAGNOSIS — E538 Deficiency of other specified B group vitamins: Secondary | ICD-10-CM

## 2022-02-25 MED ORDER — DULOXETINE HCL 30 MG PO CPEP: 30.0000 mg | ORAL_CAPSULE | Freq: Every day | ORAL | 3 refills | Status: DC

## 2022-02-25 NOTE — Assessment & Plan Note (Signed)
Aggravated by recent loss of mother.  Advised trial increase Cymbalta from 60 mg to 90 mg.  Continue BuSpar 10 mg twice daily

## 2022-02-25 NOTE — Assessment & Plan Note (Signed)
Patient like to continue B12 injection IM monthly.  Given today

## 2022-02-25 NOTE — Progress Notes (Signed)
Subjective:    Patient ID: Michelle Walsh, female    DOB: [birth year], 66 y.o.   MRN: [id number]  CC: Michelle Walsh is a 66 y.o. female who presents today for follow up.   HPI: Tearful today as her mother passed away approximately 3 weeks ago.  She was with her when she passed and it was peaceful.  She has a very strong faith and other siblings to rely on.    She is compliant with Cymbalta 60 mg, BuSpar 10 mg BID.  She is interested in increasing one of these medications to help her cope with the grief.  No thoughts of hurting herself or anyone else  Osteopenia-compliant with Fosamax once weekly. She is compliant with vit D and calcium.   HISTORY:  Past Medical History:  Diagnosis Date   Anxiety    Chronic pain of right knee    Depression    Osteoarthritis    Restless leg syndrome    Tremors of nervous system    Vitamin D deficiency    Past Surgical History:  Procedure Laterality Date   CESAREAN SECTION     teeth all removed     Family History  Problem Relation Age of Onset   Dementia Mother 101   Seizures Father    Breast cancer Maternal Grandmother 60    Allergies: Ciprofloxacin and Codeine Current Outpatient Medications on File Prior to Visit  Medication Sig Dispense Refill   albuterol (VENTOLIN HFA) 108 (90 Base) MCG/ACT inhaler Inhale 1-2 puffs into the lungs every 6 (six) hours as needed for wheezing or shortness of breath. 18 g 11   alendronate (FOSAMAX) 70 MG tablet TAKE 1 TABLET BY MOUTH EVERY 7 (SEVEN) DAYS. TAKE WITH A FULL GLASS OF WATER ON AN EMPTY STOMACH. 12 tablet 3   busPIRone (BUSPAR) 10 MG tablet TAKE 1 TABLET BY MOUTH TWICE A DAY 180 tablet 1   DULoxetine (CYMBALTA) 60 MG capsule TAKE 1 CAPSULE BY MOUTH EVERY DAY 90 capsule 1   rOPINIRole (REQUIP) 2 MG tablet Take 1 tablet (2 mg total) by mouth at bedtime. 90 tablet 1   rosuvastatin (CRESTOR) 10 MG tablet TAKE 1 TABLET BY MOUTH EVERY DAY 90 tablet 3   primidone (MYSOLINE) 50 MG tablet TAKE 1 TABLET (50  MG TOTAL) BY MOUTH IN THE MORNING AND AT BEDTIME. (Patient not taking: Reported on 02/25/2022) 180 tablet 1   No current facility-administered medications on file prior to visit.    Social History   Tobacco Use   Smoking status: Former    Types: Cigarettes    Quit date: 04/27/1988    Years since quitting: 33.8   Smokeless tobacco: Never  Vaping Use   Vaping Use: Some days  Substance Use Topics   Alcohol use: Never    Comment: very rare - 1 time per year   Drug use: Yes    Types: Marijuana    Comment: does it thru a vape - uses THC - "helps with anxiety"    Review of Systems  Constitutional:  Negative for chills and fever.  Respiratory:  Negative for cough.   Cardiovascular:  Negative for chest pain and palpitations.  Gastrointestinal:  Negative for nausea and vomiting.      Objective:    BP 118/70 (BP Location: Left Arm, Patient Position: Sitting, Cuff Size: Normal)   Pulse 79   Temp 98.4 F (36.9 C) (Oral)   Ht '5\' 4"'$  (1.626 m)   Wt 133 lb 9.6  oz (60.6 kg)   SpO2 98%   BMI 22.93 kg/m  BP Readings from Last 3 Encounters:  02/25/22 118/70  10/21/21 126/78  09/17/21 104/60   Wt Readings from Last 3 Encounters:  02/25/22 133 lb 9.6 oz (60.6 kg)  01/09/22 133 lb (60.3 kg)  10/21/21 133 lb 6.4 oz (60.5 kg)    Physical Exam Vitals reviewed.  Constitutional:      Appearance: She is well-developed.  Eyes:     Conjunctiva/sclera: Conjunctivae normal.  Cardiovascular:     Rate and Rhythm: Normal rate and regular rhythm.     Pulses: Normal pulses.     Heart sounds: Normal heart sounds.  Pulmonary:     Effort: Pulmonary effort is normal.     Breath sounds: Normal breath sounds. No wheezing, rhonchi or rales.  Skin:    General: Skin is warm and dry.  Neurological:     Mental Status: She is alert.  Psychiatric:        Speech: Speech normal.        Behavior: Behavior normal.        Thought Content: Thought content normal.        Assessment & Plan:   Problem  List Items Addressed This Visit       Musculoskeletal and Integument   Osteopenia    Due for bone density.  Patient will schedule. She is compliant with vitamin D and calcium      Relevant Orders   DG Bone Density     Other   B12 deficiency    Patient like to continue B12 injection IM monthly.  Given today      Depression with anxiety    Aggravated by recent loss of mother.  Advised trial increase Cymbalta from 60 mg to 90 mg.  Continue BuSpar 10 mg twice daily      Relevant Medications   DULoxetine (CYMBALTA) 30 MG capsule   Screening for breast cancer   Relevant Orders   MM 3D SCREEN BREAST BILATERAL   Other Visit Diagnoses     Need for immunization against influenza    -  Primary   Relevant Orders   Flu Vaccine QUAD High Dose(Fluad) (Completed)   MM 3D SCREEN BREAST BILATERAL   Screen for colon cancer       Colon cancer screening       Relevant Orders   Ambulatory referral to Gastroenterology        I have discontinued Michelle Walsh. Michelle Walsh's bisacodyl and docusate sodium. I am also having her start on DULoxetine. Additionally, I am having her maintain her primidone, albuterol, DULoxetine, rosuvastatin, rOPINIRole, busPIRone, and alendronate. We administered cyanocobalamin.   Meds ordered this encounter  Medications   DULoxetine (CYMBALTA) 30 MG capsule    Sig: Take 1 capsule (30 mg total) by mouth daily.    Dispense:  90 capsule    Refill:  3    To take with cymbalta '60mg'$     Order Specific Question:   Supervising Provider    Answer:   [name] [[id number]]    Return precautions given.   Risks, benefits, and alternatives of the medications and treatment plan prescribed today were discussed, and patient expressed understanding.   Education regarding symptom management and diagnosis given to patient on AVS.  Continue to follow with [name], FNP for routine health maintenance.   [name] and I agreed with plan.   [name], FNP

## 2022-02-25 NOTE — Assessment & Plan Note (Signed)
Due for bone density.  Patient will schedule. She is compliant with vitamin D and calcium

## 2022-02-25 NOTE — Patient Instructions (Addendum)
Increase Cymbalta from 60 mg to 90 mg.  You may take a 30 mg tablet with the 60 mg tablet together to make a 90 mg dose.   Please call  and schedule your 3D mammogram and /or bone density scan as we discussed.   [facility]  ( new location in 2023)  [address], [place], [place] [address]  [place], [place]  Referral to gastroenterology  Let us know if you dont hear back within a week in regards to an appointment being scheduled.   So that you are aware, if you are [facility] MyChart user , please pay attention to your MyChart messages as you may receive a MyChart message with a phone number to call and schedule this test/appointment own your own from our referral coordinator. This is a new process so I do not want you to miss this message.  If you are not a MyChart user, you will receive a phone call.    For post menopausal women, guidelines recommend a diet with 1200 mg of Calcium per day. If you are eating calcium rich foods, you do not need a calcium supplement. The body better absorbs the calcium that you eat over supplementation. If you do supplement, I recommend not supplementing the full 1200 mg/ day as this can lead to increased risk of cardiovascular disease. I recommend Calcium Citrate over the counter, and you may take a total of 600 to 800 mg per day in divided doses with meals for best absorption.   For bone health, you need adequate vitamin D, and I recommend you supplement as it is harder to do so with diet alone. I recommend cholecalciferol 800 units daily.  Also, please ensure you are following a diet high in calcium -- research shows better outcomes with dietary sources including kale, yogurt, broccolii, cheese, okra, almonds- to name a few.     Also remember that exercise is a great medicine for maintain and preserve bone health. Advise moderate exercise for 30 minutes , 3 times per week.

## 2022-02-25 NOTE — Telephone Encounter (Signed)
This is a colonoscopy reschedule from 11/19/21-patient had to cancel due to eye surgery.  Colonoscopy scheduled for 03/23/22 with Dr. Marius Ditch.  Thanks, [place], [place]

## 2022-02-25 NOTE — Progress Notes (Signed)
Pt was administered the vitamin b12 injection in her right deltoid. Pt tolerated shot well.

## 2022-03-18 ENCOUNTER — Encounter: Payer: Self-pay | Admitting: Gastroenterology

## 2022-03-23 ENCOUNTER — Encounter: Admission: RE | Payer: Self-pay | Source: Home / Self Care

## 2022-03-23 ENCOUNTER — Encounter: Payer: Self-pay | Admitting: Anesthesiology

## 2022-03-23 ENCOUNTER — Ambulatory Visit: Admission: RE | Admit: 2022-03-23 | Payer: PPO | Source: Home / Self Care | Admitting: Gastroenterology

## 2022-03-23 SURGERY — COLONOSCOPY WITH PROPOFOL
Anesthesia: General

## 2022-03-27 ENCOUNTER — Ambulatory Visit: Payer: PPO | Admitting: Family

## 2022-04-01 ENCOUNTER — Encounter: Payer: Self-pay | Admitting: Family

## 2022-04-01 ENCOUNTER — Ambulatory Visit (INDEPENDENT_AMBULATORY_CARE_PROVIDER_SITE_OTHER): Payer: PPO | Admitting: Family

## 2022-04-01 VITALS — BP 122/70 | HR 78 | Temp 98.2°F | Wt 131.2 lb

## 2022-04-01 DIAGNOSIS — F418 Other specified anxiety disorders: Secondary | ICD-10-CM

## 2022-04-01 DIAGNOSIS — G2581 Restless legs syndrome: Secondary | ICD-10-CM

## 2022-04-01 MED ORDER — ROPINIROLE HCL 0.5 MG PO TABS: 0.5000 mg | ORAL_TABLET | Freq: Every day | ORAL | 1 refills | Status: DC

## 2022-04-01 NOTE — Assessment & Plan Note (Signed)
Suboptimal control.Ferritin 85 12/2020.  Increase Requip from 2 mg nightly to 2.5 mg nightly.

## 2022-04-01 NOTE — Progress Notes (Signed)
Subjective:    Patient ID: Michelle Walsh, female    DOB: [birth year], 66 y.o.   MRN: [id number]  CC: Michelle Walsh is a 66 y.o. female who presents today for follow up.   HPI: Continue to have trouble sleeping due to RLS.  She is compliant with requip '2mg'$  qhs.    Ferritin 85 12/2020  she is compliant with Cymbalta 90 mg, BuSpar 10 mg twice daily which has been very helpful for anxiety.   She canceled colonoscopy and plans to reschedule.   She is no longer on primidone as not working for tremor. Propranolol was not effective in the past. Tremor is not as bothersome for her this time and she opts not to treat.     HISTORY:  Past Medical History:  Diagnosis Date   Anxiety    Chronic pain of right knee    Depression    Osteoarthritis    Restless leg syndrome    Tremors of nervous system    Vitamin D deficiency    Past Surgical History:  Procedure Laterality Date   CESAREAN SECTION     teeth all removed     Family History  Problem Relation Age of Onset   Dementia Mother 58   Seizures Father    Breast cancer Maternal Grandmother 110    Allergies: Ciprofloxacin and Codeine Current Outpatient Medications on File Prior to Visit  Medication Sig Dispense Refill   albuterol (VENTOLIN HFA) 108 (90 Base) MCG/ACT inhaler Inhale 1-2 puffs into the lungs every 6 (six) hours as needed for wheezing or shortness of breath. 18 g 11   alendronate (FOSAMAX) 70 MG tablet TAKE 1 TABLET BY MOUTH EVERY 7 (SEVEN) DAYS. TAKE WITH A FULL GLASS OF WATER ON AN EMPTY STOMACH. 12 tablet 3   busPIRone (BUSPAR) 10 MG tablet TAKE 1 TABLET BY MOUTH TWICE A DAY 180 tablet 1   DULoxetine (CYMBALTA) 30 MG capsule Take 1 capsule (30 mg total) by mouth daily. 90 capsule 3   DULoxetine (CYMBALTA) 60 MG capsule TAKE 1 CAPSULE BY MOUTH EVERY DAY 90 capsule 1   rOPINIRole (REQUIP) 2 MG tablet Take 1 tablet (2 mg total) by mouth at bedtime. 90 tablet 1   rosuvastatin (CRESTOR) 10 MG tablet TAKE 1 TABLET BY MOUTH  EVERY DAY 90 tablet 3   No current facility-administered medications on file prior to visit.    Social History   Tobacco Use   Smoking status: Former    Types: Cigarettes    Quit date: 04/27/1988    Years since quitting: 33.9   Smokeless tobacco: Never  Vaping Use   Vaping Use: Some days  Substance Use Topics   Alcohol use: Never    Comment: very rare - 1 time per year   Drug use: Yes    Types: Marijuana    Comment: does it thru a vape - uses THC - "helps with anxiety"    Review of Systems  Constitutional:  Negative for chills and fever.  Respiratory:  Negative for cough.   Cardiovascular:  Negative for chest pain and palpitations.  Gastrointestinal:  Negative for nausea and vomiting.  Psychiatric/Behavioral:  Positive for sleep disturbance. The patient is not nervous/anxious.       Objective:    BP 122/70   Pulse 78   Temp 98.2 F (36.8 C) (Oral)   Wt 131 lb 3.2 oz (59.5 kg)   SpO2 96%   BMI 22.52 kg/m  BP Readings from Last  3 Encounters:  04/01/22 122/70  02/25/22 118/70  10/21/21 126/78   Wt Readings from Last 3 Encounters:  04/01/22 131 lb 3.2 oz (59.5 kg)  02/25/22 133 lb 9.6 oz (60.6 kg)  01/09/22 133 lb (60.3 kg)    Physical Exam Vitals reviewed.  Constitutional:      Appearance: She is well-developed.  Eyes:     Conjunctiva/sclera: Conjunctivae normal.  Cardiovascular:     Rate and Rhythm: Normal rate and regular rhythm.     Pulses: Normal pulses.     Heart sounds: Normal heart sounds.  Pulmonary:     Effort: Pulmonary effort is normal.     Breath sounds: Normal breath sounds. No wheezing, rhonchi or rales.  Skin:    General: Skin is warm and dry.  Neurological:     Mental Status: She is alert.  Psychiatric:        Speech: Speech normal.        Behavior: Behavior normal.        Thought Content: Thought content normal.        Assessment & Plan:   Problem List Items Addressed This Visit       Other   Depression with anxiety     Chronic, stable.  Continue Cymbalta 90 mg qd,  BuSpar 10 mg twice daily       Restless leg syndrome - Primary    Suboptimal control.Ferritin 85 12/2020.  Increase Requip from 2 mg nightly to 2.5 mg nightly.      Relevant Medications   rOPINIRole (REQUIP) 0.5 MG tablet     I have discontinued Michelle Walsh. Michelle Walsh's primidone. I am also having her start on rOPINIRole. Additionally, I am having her maintain her albuterol, DULoxetine, rosuvastatin, rOPINIRole, busPIRone, alendronate, and DULoxetine.   Meds ordered this encounter  Medications   rOPINIRole (REQUIP) 0.5 MG tablet    Sig: Take 1 tablet (0.5 mg total) by mouth at bedtime. Take with requip '2mg'$  po qhs    Dispense:  90 tablet    Refill:  1    Order Specific Question:   Supervising Provider    Answer:   Michelle Walsh [[id number]]    Return precautions given.   Risks, benefits, and alternatives of the medications and treatment plan prescribed today were discussed, and patient expressed understanding.   Education regarding symptom management and diagnosis given to patient on AVS.  Continue to follow with Michelle Hawthorne, FNP for routine health maintenance.   Michelle Walsh and I agreed with plan.   Michelle Paris, FNP

## 2022-04-01 NOTE — Patient Instructions (Addendum)
Please call [facility] to reschedule colonoscopy at [phone]  Please call  and schedule your 3D mammogram and /or bone density scan as we discussed.   [facility]  ( new location in 2023)  [address], [place], [place] [address]  [place], [place]

## 2022-04-01 NOTE — Assessment & Plan Note (Signed)
Chronic, stable.  Continue Cymbalta 90 mg qd,  BuSpar 10 mg twice daily

## 2022-04-06 ENCOUNTER — Telehealth: Payer: Self-pay | Admitting: Family

## 2022-04-06 DIAGNOSIS — G2581 Restless legs syndrome: Secondary | ICD-10-CM

## 2022-04-06 DIAGNOSIS — F418 Other specified anxiety disorders: Secondary | ICD-10-CM

## 2022-04-14 NOTE — Telephone Encounter (Signed)
Prescription Request  04/14/2022  Is this a "Controlled Substance" medicine? No  LOV: 04/01/2022  What is the name of the medication or equipment? rOPINIRole (REQUIP) 2 MG tablet   Have you contacted your pharmacy to request a refill? Yes   Which pharmacy would you like this sent to?  [id number]- [place], [place]- [id number]: [phone]: [phone]   Patient notified that their request is being sent to the clinical staff for review and that they should receive a response within 2 business days.   [phone]

## 2022-04-15 ENCOUNTER — Other Ambulatory Visit: Payer: Self-pay | Admitting: Family

## 2022-04-15 DIAGNOSIS — G2581 Restless legs syndrome: Secondary | ICD-10-CM

## 2022-04-15 MED ORDER — ROPINIROLE HCL 2 MG PO TABS: 2.0000 mg | ORAL_TABLET | Freq: Every day | ORAL | 3 refills | Status: DC

## 2022-04-15 NOTE — Telephone Encounter (Signed)
Pt would like to be called in regards to her refill

## 2022-04-17 ENCOUNTER — Other Ambulatory Visit: Payer: Self-pay

## 2022-04-17 DIAGNOSIS — G2581 Restless legs syndrome: Secondary | ICD-10-CM

## 2022-04-17 NOTE — Telephone Encounter (Signed)
Left message to call the office back.

## 2022-04-17 NOTE — Telephone Encounter (Signed)
Prescription was sent in on 04/15/22

## 2022-04-27 HISTORY — PX: EYE SURGERY: SHX253

## 2022-05-06 ENCOUNTER — Encounter: Payer: Self-pay | Admitting: Emergency Medicine

## 2022-05-06 ENCOUNTER — Emergency Department
Admission: EM | Admit: 2022-05-06 | Discharge: 2022-05-06 | Disposition: A | Discharge status: Home / Self Care | Payer: PPO | Attending: Emergency Medicine | Admitting: Emergency Medicine

## 2022-05-06 ENCOUNTER — Emergency Department: Payer: PPO

## 2022-05-06 DIAGNOSIS — Q438 Other specified congenital malformations of intestine: Secondary | ICD-10-CM | POA: Diagnosis not present

## 2022-05-06 DIAGNOSIS — R300 Dysuria: Secondary | ICD-10-CM | POA: Diagnosis not present

## 2022-05-06 DIAGNOSIS — R109 Unspecified abdominal pain: Secondary | ICD-10-CM | POA: Diagnosis not present

## 2022-05-06 DIAGNOSIS — N23 Unspecified renal colic: Secondary | ICD-10-CM | POA: Insufficient documentation

## 2022-05-06 DIAGNOSIS — I878 Other specified disorders of veins: Secondary | ICD-10-CM | POA: Diagnosis not present

## 2022-05-06 DIAGNOSIS — I959 Hypotension, unspecified: Secondary | ICD-10-CM | POA: Diagnosis not present

## 2022-05-06 DIAGNOSIS — R1084 Generalized abdominal pain: Secondary | ICD-10-CM | POA: Diagnosis not present

## 2022-05-06 LAB — URINALYSIS, ROUTINE W REFLEX MICROSCOPIC
Bilirubin Urine: NEGATIVE
Glucose, UA: 50 mg/dL — AB
Ketones, ur: 5 mg/dL — AB
Nitrite: NEGATIVE
Protein, ur: NEGATIVE mg/dL
RBC / HPF: >50 RBC/hpf — ABNORMAL HIGH (ref 0–5)
Specific Gravity, Urine: 1.012 (ref 1.005–1.030)
pH: 8 (ref 5.0–8.0)

## 2022-05-06 LAB — CBC WITH DIFFERENTIAL/PLATELET
Abs Immature Granulocytes: 0.07 10*3/uL (ref 0.00–0.07)
Basophils Absolute: 0.1 10*3/uL (ref 0.0–0.1)
Basophils Relative: 1 %
Eosinophils Absolute: 0.1 10*3/uL (ref 0.0–0.5)
Eosinophils Relative: 1 %
HCT: 39.9 % (ref 36.0–46.0)
Hemoglobin: 12.3 g/dL (ref 12.0–15.0)
Immature Granulocytes: 1 %
Lymphocytes Relative: 13 %
Lymphs Abs: 1.9 10*3/uL (ref 0.7–4.0)
MCH: 29.4 pg (ref 26.0–34.0)
MCHC: 30.8 g/dL (ref 30.0–36.0)
MCV: 95.5 fL (ref 80.0–100.0)
Monocytes Absolute: 0.6 10*3/uL (ref 0.1–1.0)
Monocytes Relative: 4 %
Neutro Abs: 11.7 10*3/uL — ABNORMAL HIGH (ref 1.7–7.7)
Neutrophils Relative %: 80 %
Platelets: 283 10*3/uL (ref 150–400)
RBC: 4.18 MIL/uL (ref 3.87–5.11)
RDW: 12.8 % (ref 11.5–15.5)
WBC: 14.4 10*3/uL — ABNORMAL HIGH (ref 4.0–10.5)
nRBC: 0 % (ref 0.0–0.2)

## 2022-05-06 LAB — COMPREHENSIVE METABOLIC PANEL
ALT: 44 U/L (ref 0–44)
AST: 36 U/L (ref 15–41)
Albumin: 4 g/dL (ref 3.5–5.0)
Alkaline Phosphatase: 49 U/L (ref 38–126)
Anion gap: 13 (ref 5–15)
BUN: 16 mg/dL (ref 8–23)
CO2: 23 mmol/L (ref 22–32)
Calcium: 8.9 mg/dL (ref 8.9–10.3)
Chloride: 103 mmol/L (ref 98–111)
Creatinine, Ser: 1.17 mg/dL — ABNORMAL HIGH (ref 0.44–1.00)
GFR, Estimated: 51 mL/min — ABNORMAL LOW (ref >60)
Glucose, Bld: 182 mg/dL — ABNORMAL HIGH (ref 70–99)
Potassium: 3.5 mmol/L (ref 3.5–5.1)
Sodium: 139 mmol/L (ref 135–145)
Total Bilirubin: 0.6 mg/dL (ref 0.3–1.2)
Total Protein: 7.3 g/dL (ref 6.5–8.1)

## 2022-05-06 MED ORDER — LACTATED RINGERS IV BOLUS
1000.0000 mL | Freq: Once | INTRAVENOUS | Status: AC
  Administered 2022-05-06: 1000 mL via INTRAVENOUS

## 2022-05-06 MED ORDER — ACETAMINOPHEN 500 MG PO TABS
1000.0000 mg | ORAL_TABLET | Freq: Once | ORAL | Status: AC
  Administered 2022-05-06: 1000 mg via ORAL
  Filled 2022-05-06: qty 2

## 2022-05-06 NOTE — ED Provider Notes (Signed)
[facility] Provider Note    Event Date/Time   First MD Initiated Contact with Patient 05/06/22 [phone]     (approximate)   History   Flank Pain   HPI  Michelle Walsh is a 67 y.o. female who presents to the ED for evaluation of Flank Pain   I reviewed PCP visit from 12/6.  History of restless leg, depression and anxiety but overall with minimal medical problems.  Patient presents to the ED for evaluation of severe left flank pain, resolved by the time I see her.  She reports awakening early this morning the past couple hours with severe left flank pain radiating towards her left groin.  Never felt pain this severe like this in her life.  No recent or preceding illnesses.  No dysuria.  No fevers.  She does report having a couple episodes of nonbloody nonbilious emesis associated with the height of the pain.  Pain is resolved by the time I see her.  She reports when she provided the urine sample in triage, she may have passed a small kidney stone that shows me this tiny 1 mm hard object in a urine cup.  Reports that she was able to pass this stone in triage and subsequent workup such as imaging was after this happened.   Physical Exam   Triage Vital Signs: ED Triage Vitals  Enc Vitals Group     BP 05/06/22 0338 (!) 90/57     Pulse Rate 05/06/22 0338 (!) 59     Resp 05/06/22 0338 20     Temp 05/06/22 0338 97.7 F (36.5 C)     Temp Source 05/06/22 0338 Oral     SpO2 05/06/22 0335 100 %     Weight 05/06/22 0336 127 lb (57.6 kg)     Height 05/06/22 0336 '5\' 4"'$  (1.626 m)     Head Circumference --      Peak Flow --      Pain Score 05/06/22 0336 0     Pain Loc --      Pain Edu? --      Excl. in [place]? --     Most recent vital signs: Vitals:   05/06/22 0335 05/06/22 0338  BP:  (!) 90/57  Pulse:  (!) 59  Resp:  20  Temp:  97.7 F (36.5 C)  SpO2: 100% 98%    General: Awake, no distress.  CV:  Good peripheral perfusion.  Resp:  Normal effort.  Abd:  No  distention.  MSK:  No deformity noted.  Neuro:  No focal deficits appreciated. Other:     ED Results / Procedures / Treatments   Labs (all labs ordered are listed, but only abnormal results are displayed) Labs Reviewed  CBC WITH DIFFERENTIAL/PLATELET - Abnormal; Notable for the following components:      Result Value   WBC 14.4 (*)    Neutro Abs 11.7 (*)    All other components within normal limits  COMPREHENSIVE METABOLIC PANEL - Abnormal; Notable for the following components:   Glucose, Bld 182 (*)    Creatinine, Ser 1.17 (*)    GFR, Estimated 51 (*)    All other components within normal limits  URINALYSIS, ROUTINE W REFLEX MICROSCOPIC - Abnormal; Notable for the following components:   Color, Urine YELLOW (*)    APPearance HAZY (*)    Glucose, UA 50 (*)    Hgb urine dipstick MODERATE (*)    Ketones, ur 5 (*)  Leukocytes,Ua TRACE (*)    RBC / HPF >50 (*)    Bacteria, UA RARE (*)    All other components within normal limits    EKG   RADIOLOGY CT renal study interpreted by me without evidence of ureteral obstruction  Official radiology report(s): CT Renal Stone Study  Result Date: 05/06/2022 CLINICAL DATA:  67 year old female with abdomen and left side flank pain with dysuria. EXAM: CT ABDOMEN AND PELVIS WITHOUT CONTRAST TECHNIQUE: Multidetector CT imaging of the abdomen and pelvis was performed following the standard protocol without IV contrast. RADIATION DOSE REDUCTION: This exam was performed according to the departmental dose-optimization program which includes automated exposure control, adjustment of the mA and/or kV according to patient size and/or use of iterative reconstruction technique. COMPARISON:  CT Abdomen and Pelvis 12/23/2020. FINDINGS: Lower chest: Negative lung bases. No pericardial or pleural effusion. Small gastric hiatal hernia versus distal esophageal phrenic ampulla (normal variant). Hepatobiliary: Negative noncontrast liver and gallbladder.  Pancreas: Negative noncontrast pancreas. Spleen: Negative. Adrenals/Urinary Tract: Normal adrenal glands. Noncontrast kidneys appears stable from 2022, including a small extrarenal pelvis on the left. No nephrolithiasis or acute renal inflammation is evident. No hydroureter is evident. Bladder is completely decompressed. Chronic pelvic sidewall phleboliths appear stable. No convincing urinary calculus. Stomach/Bowel: Retained stool in the distal rectosigmoid colon, and also the cecum which partially occupies the pelvis. But the upstream sigmoid and descending colon are decompressed. Redundant transverse colon with abundant retained stool. And similar appearance of the right colon. However, no large bowel inflammation is identified. Appendix not identified. Terminal ileum is nondilated. No dilated small bowel. Aside from possible small gastric hiatal hernia, stomach and duodenum appear negative. No free air, free fluid, or mesenteric inflammation identified. Vascular/Lymphatic: Aortoiliac calcified atherosclerosis. Infrarenal abdominal aorta measures up to 26 mm diameter which is greater than 1.5 times the diameter of the upstream aorta on series 2, image 33. Vascular patency is not evaluated in the absence of IV contrast. No lymphadenopathy identified. Reproductive: Negative noncontrast appearance. Other: No pelvic free fluid. Musculoskeletal: No acute osseous abnormality identified. Chronic T10 degenerative endplate sclerosis. IMPRESSION: 1. No urinary calculus or obstructive uropathy identified. 2. Redundant large bowel with abundant retained stool suggesting Constipation. But overall the colon is more normal than on the 2022 comparison. 3. Aortic Atherosclerosis (ICD10-I70.0), and 2.6 cm infrarenal abdominal aortic aneurysm suspected. Recommend follow-up every 5 years. Reference: J Am Coll Radiol 7342;87:681-157. 4. No other acute or inflammatory process identified in the noncontrast abdomen or pelvis.  Electronically Signed   By: Genevie Ann M.D.   On: 05/06/2022 04:13    PROCEDURES and INTERVENTIONS:  Procedures  Medications  lactated ringers bolus 1,000 mL (has no administration in time range)  acetaminophen (TYLENOL) tablet 1,000 mg (has no administration in time range)     IMPRESSION / MDM / ASSESSMENT AND PLAN / ED COURSE  I reviewed the triage vital signs and the nursing notes.  Differential diagnosis includes, but is not limited to, ureteral stone, UTI, pyelonephritis, sepsis  {Patient presents with symptoms of an acute illness or injury that is potentially life-threatening.  67 year old woman presents with left flank pain likely a ureteral stone.  CT does not show any evidence of this, because she likely passed it in triage.  She was noted to have leukocytosis, but I doubt infectious etiologies of her symptoms.  Urine with mostly blood and only trace leukocytes.  Metabolic panel with normal electrolytes.  We discussed possible etiologies of her stone and she admits  to drinking "a lot of brown soda" and we discussed possible contribution to formation of a stone.  We we will provide some IV fluids and ensure her toleration of p.o. prior to discharge.      FINAL CLINICAL IMPRESSION(S) / ED DIAGNOSES   Final diagnoses:  Ureteral colic     Rx / DC Orders   ED Discharge Orders     None        Note:  This document was prepared using Dragon voice recognition software and may include unintentional dictation errors.   Vladimir Crofts, MD 05/06/22 [phone]

## 2022-05-06 NOTE — ED Triage Notes (Signed)
Pt presents via POV with complaints of L sided flank pain that started tonight with associated dysuria. Pt received '4mg'$  zofran and 14mg of fentanyl with EMS with improvement in her sx. Denies CP or SOB.

## 2022-05-18 ENCOUNTER — Telehealth: Payer: Self-pay

## 2022-05-18 NOTE — Telephone Encounter (Signed)
     Patient  visit on 1/10  at [place]   Have you been able to follow up with your primary care physician? Yes   The patient was or was not able to obtain any needed medicine or equipment. Yes   Are there diet recommendations that you are having difficulty following? Na   Patient expresses understanding of discharge instructions and education provided has no other needs at this time.  Yes      [name], [facility], Care Management  [phone] [address], [place], [place] [address] Phone: [phone] Email: [email or web address]

## 2022-05-25 DIAGNOSIS — H2511 Age-related nuclear cataract, right eye: Secondary | ICD-10-CM | POA: Diagnosis not present

## 2022-06-09 DIAGNOSIS — H348312 Tributary (branch) retinal vein occlusion, right eye, stable: Secondary | ICD-10-CM | POA: Diagnosis not present

## 2022-06-09 DIAGNOSIS — H43392 Other vitreous opacities, left eye: Secondary | ICD-10-CM | POA: Diagnosis not present

## 2022-06-09 DIAGNOSIS — H43822 Vitreomacular adhesion, left eye: Secondary | ICD-10-CM | POA: Diagnosis not present

## 2022-06-09 DIAGNOSIS — H59812 Chorioretinal scars after surgery for detachment, left eye: Secondary | ICD-10-CM | POA: Diagnosis not present

## 2022-07-17 ENCOUNTER — Encounter: Payer: Self-pay | Admitting: Family

## 2022-07-17 ENCOUNTER — Ambulatory Visit (INDEPENDENT_AMBULATORY_CARE_PROVIDER_SITE_OTHER): Payer: PPO | Admitting: Family

## 2022-07-17 VITALS — BP 126/78 | HR 78 | Temp 98.0°F | Ht 64.0 in | Wt 121.8 lb

## 2022-07-17 DIAGNOSIS — Z1211 Encounter for screening for malignant neoplasm of colon: Secondary | ICD-10-CM

## 2022-07-17 DIAGNOSIS — I700 Atherosclerosis of aorta: Secondary | ICD-10-CM | POA: Diagnosis not present

## 2022-07-17 DIAGNOSIS — R319 Hematuria, unspecified: Secondary | ICD-10-CM | POA: Diagnosis not present

## 2022-07-17 DIAGNOSIS — F418 Other specified anxiety disorders: Secondary | ICD-10-CM

## 2022-07-17 DIAGNOSIS — R899 Unspecified abnormal finding in specimens from other organs, systems and tissues: Secondary | ICD-10-CM

## 2022-07-17 DIAGNOSIS — E785 Hyperlipidemia, unspecified: Secondary | ICD-10-CM | POA: Diagnosis not present

## 2022-07-17 DIAGNOSIS — M8580 Other specified disorders of bone density and structure, unspecified site: Secondary | ICD-10-CM

## 2022-07-17 DIAGNOSIS — Z780 Asymptomatic menopausal state: Secondary | ICD-10-CM

## 2022-07-17 DIAGNOSIS — E538 Deficiency of other specified B group vitamins: Secondary | ICD-10-CM

## 2022-07-17 DIAGNOSIS — I714 Abdominal aortic aneurysm, without rupture, unspecified: Secondary | ICD-10-CM | POA: Diagnosis not present

## 2022-07-17 MED ORDER — ROSUVASTATIN CALCIUM 20 MG PO TABS: 20.0000 mg | ORAL_TABLET | Freq: Every day | ORAL | 3 refills | Status: DC

## 2022-07-17 MED ORDER — BUSPIRONE HCL 10 MG PO TABS: 10.0000 mg | ORAL_TABLET | Freq: Two times a day (BID) | ORAL | 3 refills | Status: DC

## 2022-07-17 NOTE — Patient Instructions (Addendum)
Increase Crestor from 10 mg daily to 20 mg daily.  Fasting labs in 6 weeks  please call  and schedule your 3D mammogram and /or bone density scan as we discussed.   [facility]  ( new location in 2023)  [address], [place], [place] [address]  [place], [place]   Referral to gastroenterology and vascular. Let us know if you dont hear back within a week in regards to an appointment being scheduled.   So that you are aware, if you are [facility] MyChart user , please pay attention to your MyChart messages as you may receive a MyChart message with a phone number to call and schedule this test/appointment own your own from our referral coordinator. This is a new process so I do not want you to miss this message.  If you are not a MyChart user, you will receive a phone call.

## 2022-07-17 NOTE — Assessment & Plan Note (Signed)
Reviewed CT renal emergency room visit which showed again aortic atherosclerosis.  We opted to increase Crestor from 10 mg daily to 20 mg daily for moderate intensity dose.

## 2022-07-17 NOTE — Addendum Note (Signed)
Addended by: Leeanne Rio on: 07/17/2022 03:54 PM   Modules accepted: Orders

## 2022-07-17 NOTE — Progress Notes (Signed)
Assessment & Plan:  Asymptomatic postmenopausal state -     DG Bone Density; Future  Depression with anxiety -     busPIRone HCl; Take 1 tablet (10 mg total) by mouth 2 (two) times daily.  Dispense: 180 tablet; Refill: 3  Hyperlipidemia, unspecified hyperlipidemia type -     Hepatic function panel; Future -     Lipid panel; Future  Abnormal laboratory test  B12 deficiency Assessment & Plan: Patient prefers to stay on monthly B12 injection.  Given today.  Orders: -     CBC with Differential/Platelet -     B12 and Folate Panel  Osteopenia, unspecified location Assessment & Plan: Pending dexa. Continue fosamax 70mg  qwk.   Orders: -     DG Bone Density; Future  Aortic atherosclerosis [facility]) Assessment & Plan: Reviewed CT renal emergency room visit which showed again aortic atherosclerosis.  We opted to increase Crestor from 10 mg daily to 20 mg daily for moderate intensity dose.  Orders: -     Rosuvastatin Calcium; Take 1 tablet (20 mg total) by mouth daily.  Dispense: 90 tablet; Refill: 3  Screen for colon cancer -     Ambulatory referral to Gastroenterology  Hematuria, unspecified type -     Urinalysis, Routine w reflex microscopic  Abdominal aneurysm [facility]) Assessment & Plan: Referral to vascular for evaluation and surveillance.   Orders: -     Ambulatory referral to Vascular Surgery     Return precautions given.   Risks, benefits, and alternatives of the medications and treatment plan prescribed today were discussed, and patient expressed understanding.   Education regarding symptom management and diagnosis given to patient on AVS either electronically or printed.  Return for [employer] upcoming or due, schedule.  [name], FNP  Subjective:    Patient ID: Michelle Walsh, female    DOB: 05/10/1955, 67 y.o.   MRN: [id number]  CC: Michelle Walsh is a 67 y.o. female who presents today for follow up.   HPI: Feels well today.  No new  complaints  She passed renal stone Constipation resolved.   Compliant with fosamax   Presented to emergency room 05/06/22 for suspected renal colic.  CT renal obtained.  No urinary calculus.  Redundant large bowel retained stool suggesting constipation.  Aortic atherosclerosis and 2.6 cm infrarenal abdominal aortic aneurysm suspected.   Urinalysis with RBCs.  Former smoker    Allergies: Ciprofloxacin and Codeine Current Outpatient Medications on File Prior to Visit  Medication Sig Dispense Refill   albuterol (VENTOLIN HFA) 108 (90 Base) MCG/ACT inhaler Inhale 1-2 puffs into the lungs every 6 (six) hours as needed for wheezing or shortness of breath. 18 g 11   alendronate (FOSAMAX) 70 MG tablet TAKE 1 TABLET BY MOUTH EVERY 7 (SEVEN) DAYS. TAKE WITH A FULL GLASS OF WATER ON AN EMPTY STOMACH. 12 tablet 3   DULoxetine (CYMBALTA) 30 MG capsule Take 1 capsule (30 mg total) by mouth daily. 90 capsule 3   DULoxetine (CYMBALTA) 60 MG capsule TAKE 1 CAPSULE BY MOUTH EVERY DAY 90 capsule 3   rOPINIRole (REQUIP) 0.5 MG tablet Take 1 tablet (0.5 mg total) by mouth at bedtime. Take with requip 2mg  po qhs 90 tablet 1   rOPINIRole (REQUIP) 2 MG tablet Take 1 tablet (2 mg total) by mouth at bedtime. 90 tablet 3   No current facility-administered medications on file prior to visit.    Review of Systems  Constitutional:  Negative for chills and fever.  Respiratory:  Negative for cough.   Cardiovascular:  Negative for chest pain and palpitations.  Gastrointestinal:  Negative for nausea and vomiting.  Neurological:  Positive for tremors.      Objective:    BP 126/78   Pulse 78   Temp 98 F (36.7 C) (Oral)   Ht 5\' 4"  (1.626 m)   Wt 121 lb 12.8 oz (55.2 kg)   SpO2 99%   BMI 20.91 kg/m  BP Readings from Last 3 Encounters:  07/17/22 126/78  05/06/22 96/62  04/01/22 122/70   Wt Readings from Last 3 Encounters:  07/17/22 121 lb 12.8 oz (55.2 kg)  05/06/22 127 lb (57.6 kg)  04/01/22 131 lb  3.2 oz (59.5 kg)    Physical Exam Vitals reviewed.  Constitutional:      Appearance: She is well-developed.  Eyes:     Conjunctiva/sclera: Conjunctivae normal.  Cardiovascular:     Rate and Rhythm: Normal rate and regular rhythm.     Pulses: Normal pulses.     Heart sounds: Normal heart sounds.  Pulmonary:     Effort: Pulmonary effort is normal.     Breath sounds: Normal breath sounds. No wheezing, rhonchi or rales.  Skin:    General: Skin is warm and dry.  Neurological:     Mental Status: She is alert.  Psychiatric:        Speech: Speech normal.        Behavior: Behavior normal.        Thought Content: Thought content normal.

## 2022-07-17 NOTE — Assessment & Plan Note (Signed)
Referral to vascular for evaluation and surveillance.

## 2022-07-17 NOTE — Assessment & Plan Note (Signed)
Pending dexa. Continue fosamax 70mg  qwk.

## 2022-07-17 NOTE — Assessment & Plan Note (Signed)
Patient prefers to stay on monthly B12 injection.  Given today.

## 2022-07-22 ENCOUNTER — Telehealth: Payer: Self-pay

## 2022-07-22 ENCOUNTER — Other Ambulatory Visit: Payer: Self-pay

## 2022-07-22 DIAGNOSIS — Z1211 Encounter for screening for malignant neoplasm of colon: Secondary | ICD-10-CM

## 2022-07-22 NOTE — Telephone Encounter (Signed)
Gastroenterology Pre-Procedure Review  Request Date: 08/06/22 Requesting Physician: Dr. Vicente Males  PATIENT REVIEW QUESTIONS: The patient responded to the following health history questions as indicated:    1. Are you having any GI issues? no 2. Do you have a personal history of Polyps? no 3. Do you have a family history of Colon Cancer or Polyps? no 4. Diabetes Mellitus? no 5. Joint replacements in the past 12 months?no 6. Major health problems in the past 3 months?no 7. Any artificial heart valves, MVP, or defibrillator?no    MEDICATIONS & ALLERGIES:    Patient reports the following regarding taking any anticoagulation/antiplatelet therapy:   Plavix, Coumadin, Eliquis, Xarelto, Lovenox, Pradaxa, Brilinta, or Effient? no Aspirin? no  Patient confirms/reports the following medications:  Current Outpatient Medications  Medication Sig Dispense Refill   albuterol (VENTOLIN HFA) 108 (90 Base) MCG/ACT inhaler Inhale 1-2 puffs into the lungs every 6 (six) hours as needed for wheezing or shortness of breath. 18 g 11   alendronate (FOSAMAX) 70 MG tablet TAKE 1 TABLET BY MOUTH EVERY 7 (SEVEN) DAYS. TAKE WITH A FULL GLASS OF WATER ON AN EMPTY STOMACH. 12 tablet 3   busPIRone (BUSPAR) 10 MG tablet Take 1 tablet (10 mg total) by mouth 2 (two) times daily. 180 tablet 3   DULoxetine (CYMBALTA) 30 MG capsule Take 1 capsule (30 mg total) by mouth daily. 90 capsule 3   DULoxetine (CYMBALTA) 60 MG capsule TAKE 1 CAPSULE BY MOUTH EVERY DAY 90 capsule 3   rOPINIRole (REQUIP) 0.5 MG tablet Take 1 tablet (0.5 mg total) by mouth at bedtime. Take with requip 2mg  po qhs 90 tablet 1   rOPINIRole (REQUIP) 2 MG tablet Take 1 tablet (2 mg total) by mouth at bedtime. 90 tablet 3   rosuvastatin (CRESTOR) 20 MG tablet Take 1 tablet (20 mg total) by mouth daily. 90 tablet 3   No current facility-administered medications for this visit.    Patient confirms/reports the following allergies:  Allergies  Allergen Reactions    Ciprofloxacin     Sick with C.Diff for 2 months after taking medication   Codeine Nausea And Vomiting    No orders of the defined types were placed in this encounter.   AUTHORIZATION INFORMATION Primary Insurance: 1D#: Group #:  Secondary Insurance: 1D#: Group #:  SCHEDULE INFORMATION: Date: 08/06/22 Time: Location: [facility]

## 2022-08-05 ENCOUNTER — Encounter: Payer: Self-pay | Admitting: Gastroenterology

## 2022-08-06 ENCOUNTER — Encounter
Admission: RE | Disposition: A | Discharge status: Home / Self Care | Payer: Self-pay | Source: Ambulatory Visit | Attending: Gastroenterology

## 2022-08-06 ENCOUNTER — Encounter: Payer: Self-pay | Admitting: Gastroenterology

## 2022-08-06 ENCOUNTER — Ambulatory Visit: Payer: PPO | Admitting: Registered Nurse

## 2022-08-06 ENCOUNTER — Ambulatory Visit
Admission: RE | Admit: 2022-08-06 | Discharge: 2022-08-06 | Disposition: A | Discharge status: Home / Self Care | Payer: PPO | Source: Ambulatory Visit | Attending: Gastroenterology | Admitting: Gastroenterology

## 2022-08-06 DIAGNOSIS — J449 Chronic obstructive pulmonary disease, unspecified: Secondary | ICD-10-CM | POA: Insufficient documentation

## 2022-08-06 DIAGNOSIS — F419 Anxiety disorder, unspecified: Secondary | ICD-10-CM | POA: Diagnosis not present

## 2022-08-06 DIAGNOSIS — Z1211 Encounter for screening for malignant neoplasm of colon: Secondary | ICD-10-CM | POA: Insufficient documentation

## 2022-08-06 DIAGNOSIS — F32A Depression, unspecified: Secondary | ICD-10-CM | POA: Insufficient documentation

## 2022-08-06 DIAGNOSIS — Z87891 Personal history of nicotine dependence: Secondary | ICD-10-CM | POA: Insufficient documentation

## 2022-08-06 DIAGNOSIS — G2581 Restless legs syndrome: Secondary | ICD-10-CM | POA: Insufficient documentation

## 2022-08-06 HISTORY — PX: COLONOSCOPY WITH PROPOFOL: SHX5780

## 2022-08-06 SURGERY — COLONOSCOPY WITH PROPOFOL
Anesthesia: General

## 2022-08-06 MED ORDER — SODIUM CHLORIDE 0.9 % IV SOLN: INTRAVENOUS | Status: DC

## 2022-08-06 MED ORDER — PROPOFOL 10 MG/ML IV BOLUS
INTRAVENOUS | Status: DC | PRN
  Administered 2022-08-06: 70 mg via INTRAVENOUS

## 2022-08-06 MED ORDER — PROPOFOL 500 MG/50ML IV EMUL
INTRAVENOUS | Status: DC | PRN
  Administered 2022-08-06: 140 ug/kg/min via INTRAVENOUS

## 2022-08-06 MED ORDER — LIDOCAINE HCL (CARDIAC) PF 100 MG/5ML IV SOSY
PREFILLED_SYRINGE | INTRAVENOUS | Status: DC | PRN
  Administered 2022-08-06: 60 mg via INTRAVENOUS

## 2022-08-06 NOTE — Anesthesia Preprocedure Evaluation (Signed)
Anesthesia Evaluation  Patient identified by MRN, date of birth, ID band Patient awake    Reviewed: Allergy & Precautions, NPO status , Patient's Chart, lab work & pertinent test results  History of Anesthesia Complications Negative for: history of anesthetic complications  Airway Mallampati: II  TM Distance: >3 FB Neck ROM: Full    Dental  (+) Teeth Intact   Pulmonary neg sleep apnea, COPD, Patient abstained from smoking.Not current smoker, former smoker   Pulmonary exam normal breath sounds clear to auscultation       Cardiovascular Exercise Tolerance: Good METS(-) hypertension(-) CAD and (-) Past MI negative cardio ROS (-) dysrhythmias  Rhythm:Regular Rate:Normal - Systolic murmurs    Neuro/Psych  PSYCHIATRIC DISORDERS Anxiety Depression    negative neurological ROS     GI/Hepatic ,neg GERD  ,,(+)     (-) substance abuse    Endo/Other  neg diabetes    Renal/GU CRFRenal disease     Musculoskeletal   Abdominal   Peds  Hematology   Anesthesia Other Findings Past Medical History: No date: Anxiety No date: Chronic pain of right knee No date: Depression No date: Osteoarthritis No date: Restless leg syndrome No date: Tremors of nervous system No date: Vitamin D deficiency  Reproductive/Obstetrics                             Anesthesia Physical Anesthesia Plan  ASA: 2  Anesthesia Plan: General   Post-op Pain Management: Minimal or no pain anticipated   Induction: Intravenous  PONV Risk Score and Plan: 3 and Propofol infusion, TIVA and Ondansetron  Airway Management Planned: Nasal Cannula  Additional Equipment: None  Intra-op Plan:   Post-operative Plan:   Informed Consent: I have reviewed the patients History and Physical, chart, labs and discussed the procedure including the risks, benefits and alternatives for the proposed anesthesia with the patient or authorized  representative who has indicated his/her understanding and acceptance.     Dental advisory given  Plan Discussed with: CRNA and Surgeon  Anesthesia Plan Comments: (Discussed risks of anesthesia with patient, including possibility of difficulty with spontaneous ventilation under anesthesia necessitating airway intervention, PONV, and rare risks such as cardiac or respiratory or neurological events, and allergic reactions. Discussed the role of CRNA in patient's perioperative care. Patient understands.)       Anesthesia Quick Evaluation

## 2022-08-06 NOTE — Op Note (Signed)
[facility] Gastroenterology Patient Name: Michelle BridegroomLinda Walsh Procedure Date: 08/06/2022 9:28 AM MRN: [id number] Account #: [id number] Date of Birth: 1956-02-19 Admit Type: Outpatient Age: 67 [facility] ENDO ROOM 4 Gender: Female Note Status: Finalized Instrument Name: Nelda MarseilleColonscope [id number] Procedure:             Colonoscopy Indications:           Screening for colorectal malignant neoplasm Providers:             Wyline MoodKiran Kathleen Tamm MD, MD Referring MD:          Lyn RecordsMargaret G. Arnett (Referring MD) Medicines:             Monitored Anesthesia Care Complications:         No immediate complications. Procedure:             Pre-Anesthesia Assessment:                        - Prior to the procedure, a History and Physical was                         performed, and patient medications, allergies and                         sensitivities were reviewed. The patient's tolerance                         of previous anesthesia was reviewed.                        - The risks and benefits of the procedure and the                         sedation options and risks were discussed with the                         patient. All questions were answered and informed                         consent was obtained.                        - ASA Grade Assessment: II - A patient with mild                         systemic disease.                        After obtaining informed consent, the colonoscope was                         passed under direct vision. Throughout the procedure,                         the patient's blood pressure, pulse, and oxygen                         saturations were monitored continuously. The                         Colonoscope was introduced  through the anus with the                         intention of advancing to the cecum. The scope was                         advanced to the sigmoid colon before the procedure was                         aborted. Medications were given. The  colonoscopy was                         performed with ease. The patient tolerated the                         procedure well. The quality of the bowel preparation                         was inadequate. Findings:      A moderate amount of semi-liquid stool was found in the rectum and in       the sigmoid colon, interfering with visualization. Impression:            - Preparation of the colon was inadequate.                        - Stool in the rectum and in the sigmoid colon.                        - No specimens collected. Recommendation:        - Discharge patient to home (with escort).                        - Resume previous diet.                        - Continue present medications.                        - Repeat colonoscopy in 1 week because the bowel                         preparation was suboptimal. Procedure Code(s):     --- Professional ---                        [phone], 53, Colonoscopy, flexible; diagnostic,                         including collection of specimen(s) by brushing or                         washing, when performed (separate procedure) Diagnosis Code(s):     --- Professional ---                        Z12.11, Encounter for screening for malignant neoplasm                         of colon CPT copyright 2022 American Medical Association. All rights  reserved. The codes documented in this report are preliminary and upon coder review may  be revised to meet current compliance requirements. Wyline Mood, MD Wyline Mood MD, MD 08/06/2022 9:35:55 AM This report has been signed electronically. Number of Addenda: 0 Note Initiated On: 08/06/2022 9:28 AM Total Procedure Duration: 0 hours 1 minute 20 seconds  Estimated Blood Loss:  Estimated blood loss: none.      [facility]

## 2022-08-06 NOTE — Anesthesia Postprocedure Evaluation (Signed)
Anesthesia Post Note  Patient: Michelle Walsh  Procedure(s) Performed: COLONOSCOPY WITH PROPOFOL  Patient location during evaluation: Endoscopy Anesthesia Type: General Level of consciousness: awake and alert Pain management: pain level controlled Vital Signs Assessment: post-procedure vital signs reviewed and stable Respiratory status: spontaneous breathing, nonlabored ventilation, respiratory function stable and patient connected to nasal cannula oxygen Cardiovascular status: blood pressure returned to baseline and stable Postop Assessment: no apparent nausea or vomiting Anesthetic complications: no   No notable events documented.   Last Vitals:  Vitals:   08/06/22 0947 08/06/22 0957  BP: (!) 100/53 (!) 102/56  Pulse: 68 61  Resp: 10 14  Temp:    SpO2: 100% 100%    Last Pain:  Vitals:   08/06/22 0957  TempSrc:   PainSc: 0-No pain                 Corinda Gubler

## 2022-08-06 NOTE — H&P (Signed)
Wyline Mood, MD [address], [address], [place], [place], [address] [address], [place], [place], [address] Phone: [phone]  Fax: [phone]  Primary Care Physician:  Allegra Grana, FNP   Pre-Procedure History & Physical: HPI:  Michelle Walsh is a 67 y.o. female is here for an colonoscopy.   Past Medical History:  Diagnosis Date   Anxiety    Chronic pain of right knee    Depression    Osteoarthritis    Restless leg syndrome    Tremors of nervous system    Vitamin D deficiency     Past Surgical History:  Procedure Laterality Date   CESAREAN SECTION     teeth all removed      Prior to Admission medications   Medication Sig Start Date End Date Taking? Authorizing Provider  alendronate (FOSAMAX) 70 MG tablet TAKE 1 TABLET BY MOUTH EVERY 7 (SEVEN) DAYS. TAKE WITH A FULL GLASS OF WATER ON AN EMPTY STOMACH. 02/16/22  Yes Arnett, Lyn Records, FNP  busPIRone (BUSPAR) 10 MG tablet Take 1 tablet (10 mg total) by mouth 2 (two) times daily. 07/17/22  Yes Allegra Grana, FNP  DULoxetine (CYMBALTA) 30 MG capsule Take 1 capsule (30 mg total) by mouth daily. 02/25/22  Yes Allegra Grana, FNP  DULoxetine (CYMBALTA) 60 MG capsule TAKE 1 CAPSULE BY MOUTH EVERY DAY 04/07/22  Yes Arnett, Lyn Records, FNP  rOPINIRole (REQUIP) 0.5 MG tablet Take 1 tablet (0.5 mg total) by mouth at bedtime. Take with requip 2mg  po qhs 04/01/22  Yes Arnett, Lyn Records, FNP  rOPINIRole (REQUIP) 2 MG tablet Take 1 tablet (2 mg total) by mouth at bedtime. 04/15/22  Yes Allegra Grana, FNP  rosuvastatin (CRESTOR) 20 MG tablet Take 1 tablet (20 mg total) by mouth daily. 07/17/22  Yes Arnett, Lyn Records, FNP  albuterol (VENTOLIN HFA) 108 (90 Base) MCG/ACT inhaler Inhale 1-2 puffs into the lungs every 6 (six) hours as needed for wheezing or shortness of breath. 08/08/21   McLean-Scocuzza, Pasty Spillers, MD    Allergies as of 07/23/2022 - Review Complete 07/17/2022  Allergen Reaction Noted    Ciprofloxacin  04/19/2019   Codeine Nausea And Vomiting 01/20/2016    Family History  Problem Relation Age of Onset   Dementia Mother 23   Seizures Father    Heart attack Sister        MI; she was step sister, 95 years old   Breast cancer Maternal Grandmother 42    Social History   Socioeconomic History   Marital status: Married    Spouse name: Not on file   Number of children: 2   Years of education: 12   Highest education level: Not on file  Occupational History   Occupation: retired  Tobacco Use   Smoking status: Former    Types: Cigarettes    Quit date: 04/27/1988    Years since quitting: 34.2   Smokeless tobacco: Never  Vaping Use   Vaping Use: Some days  Substance and Sexual Activity   Alcohol use: Never    Comment: very rare - 1 time per year   Drug use: Yes    Types: Marijuana    Comment: does it thru a vape - uses THC - "helps with anxiety"   Sexual activity: Not on file  Other Topics Concern   Not on file  Social History Narrative   2 children; one child lives with her and her 4 children  Right handed   One story home   Social Determinants of Health   Financial Resource Strain: Low Risk  (01/09/2022)   Overall Financial Resource Strain (CARDIA)    Difficulty of Paying Living Expenses: Not hard at all  Food Insecurity: No Food Insecurity (01/09/2022)   Hunger Vital Sign    Worried About Running Out of Food in the Last Year: Never true    Ran Out of Food in the Last Year: Never true  Transportation Needs: No Transportation Needs (01/09/2022)   PRAPARE - Administrator, Civil Service (Medical): No    Lack of Transportation (Non-Medical): No  Physical Activity: Not on file  Stress: No Stress Concern Present (01/09/2022)   [employer] of Occupational Health - Occupational Stress Questionnaire    Feeling of Stress : Not at all  Social Connections: Unknown (01/09/2022)   Social Connection and Isolation Panel [NHANES]    Frequency of  Communication with Friends and Family: More than three times a week    Frequency of Social Gatherings with Friends and Family: More than three times a week    Attends Religious Services: Not on file    Active Member of Clubs or Organizations: Not on file    Attends Banker Meetings: Not on file    Marital Status: Married  Intimate Partner Violence: Not At Risk (01/09/2022)   Humiliation, Afraid, Rape, and Kick questionnaire    Fear of Current or Ex-Partner: No    Emotionally Abused: No    Physically Abused: No    Sexually Abused: No    Review of Systems: See HPI, otherwise negative ROS  Physical Exam: BP 118/75   Pulse 74   Temp (!) 96.5 F (35.8 C) (Temporal)   Resp 18   Ht 5\' 4"  (1.626 m)   Wt 54.9 kg   SpO2 100%   BMI 20.77 kg/m  General:   Alert,  pleasant and cooperative in NAD Head:  Normocephalic and atraumatic. Neck:  Supple; no masses or thyromegaly. Lungs:  Clear throughout to auscultation, normal respiratory effort.    Heart:  +S1, +S2, Regular rate and rhythm, No edema. Abdomen:  Soft, nontender and nondistended. Normal bowel sounds, without guarding, and without rebound.   Neurologic:  Alert and  oriented x4;  grossly normal neurologically.  Impression/Plan: [name] is here for an colonoscopy to be performed for Screening colonoscopy average risk   Risks, benefits, limitations, and alternatives regarding  colonoscopy have been reviewed with the patient.  Questions have been answered.  All parties agreeable.   Wyline Mood, MD  08/06/2022, 8:52 AM

## 2022-08-06 NOTE — Transfer of Care (Signed)
Immediate Anesthesia Transfer of Care Note  Patient: Michelle Walsh  Procedure(s) Performed: COLONOSCOPY WITH PROPOFOL  Patient Location: PACU  Anesthesia Type:General  Level of Consciousness: awake  Airway & Oxygen Therapy: Patient Spontanous Breathing and Patient connected to nasal cannula oxygen  Post-op Assessment: Report given to RN and Post -op Vital signs reviewed and stable  Post vital signs: Reviewed and stable  Last Vitals:  Vitals Value Taken Time  BP 90/44 08/06/22 0938  Temp    Pulse 73 08/06/22 0938  Resp 16 08/06/22 0938  SpO2 98 % 08/06/22 0938  Vitals shown include unvalidated device data.  Last Pain:  Vitals:   08/06/22 0849  TempSrc: Temporal  PainSc: 0-No pain         Complications: No notable events documented.

## 2022-08-07 ENCOUNTER — Encounter: Payer: Self-pay | Admitting: Gastroenterology

## 2022-08-24 IMAGING — CR DG CHEST 2V
1 series · 2 of 2 positions shown · non-contrast
Comparison: None.

CLINICAL DATA: Persistent productive cough

EXAM:
CHEST - 2 VIEW

[Series 1: dg chest 2 view · 0.14mm/px · 2 of 2 slices shown]
[im 1/2]
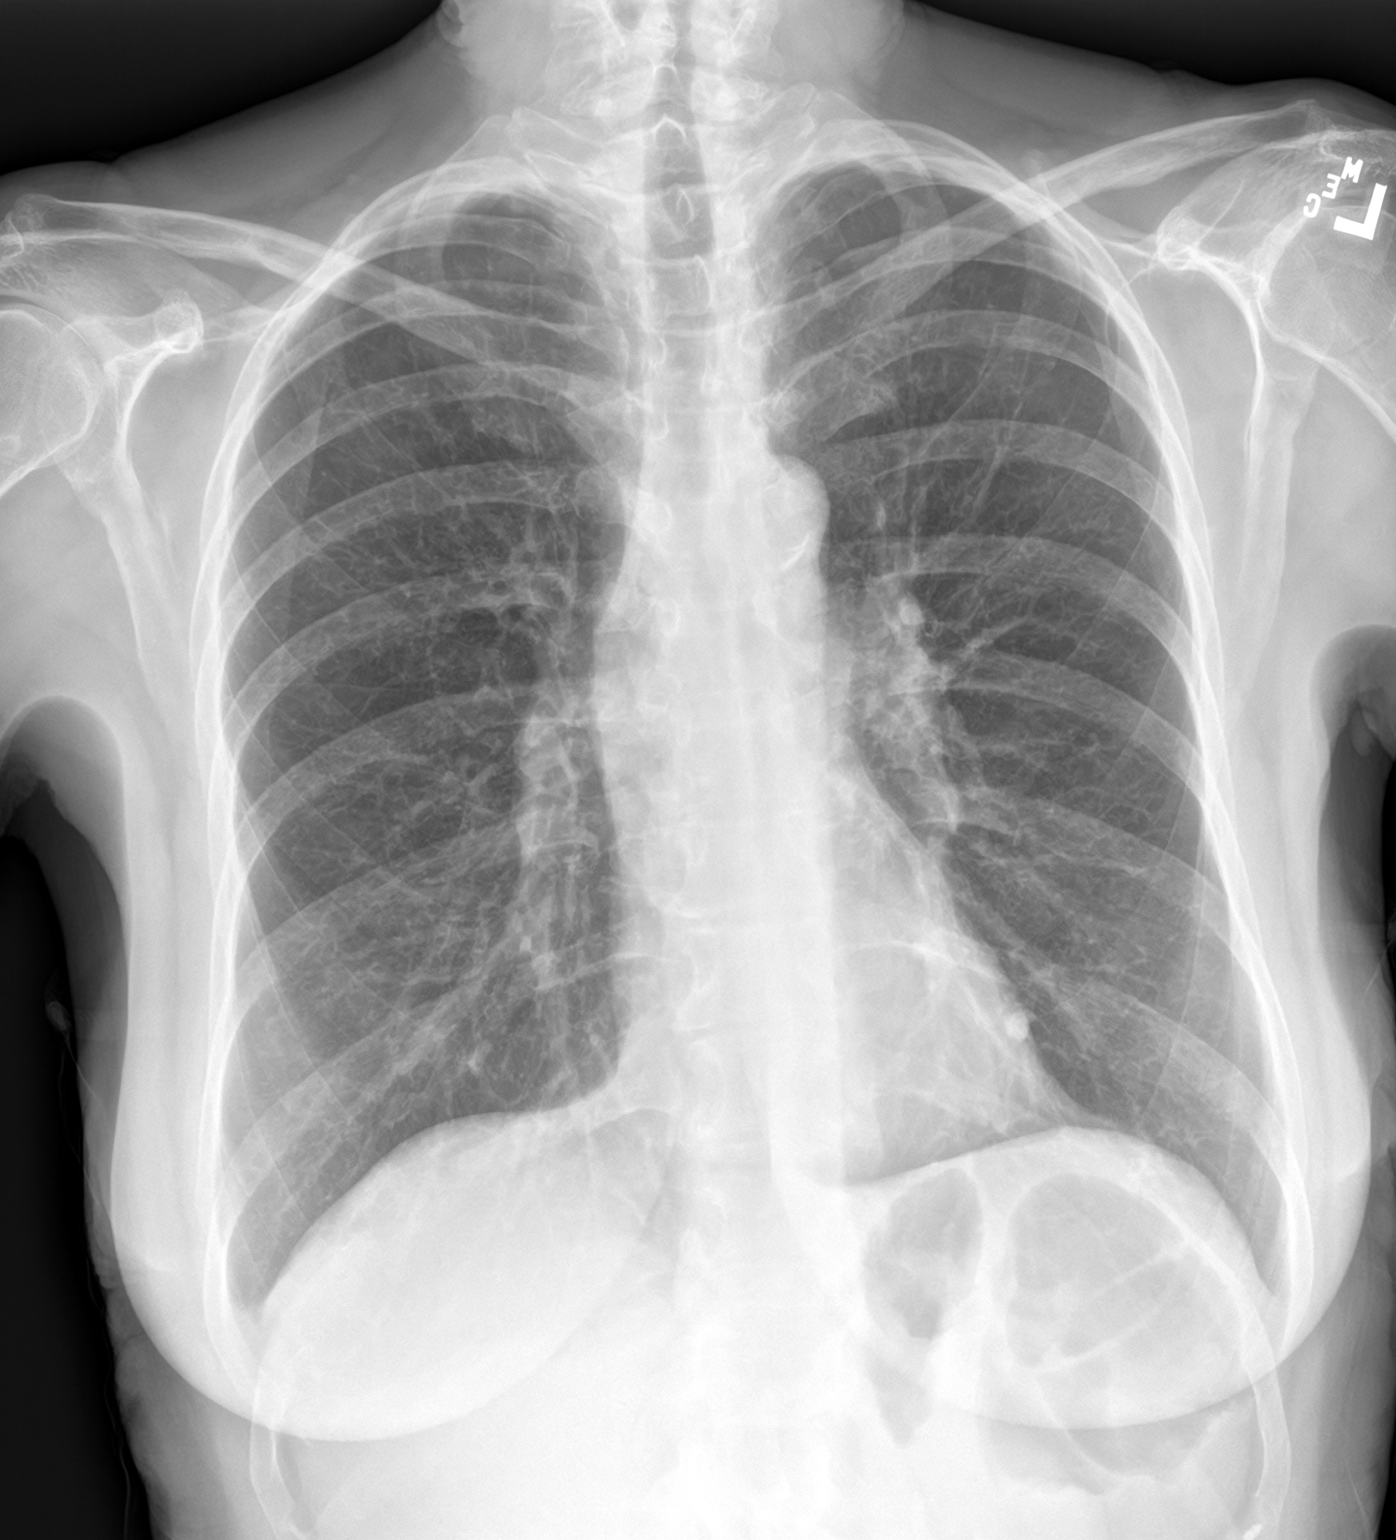
[im 2/2]
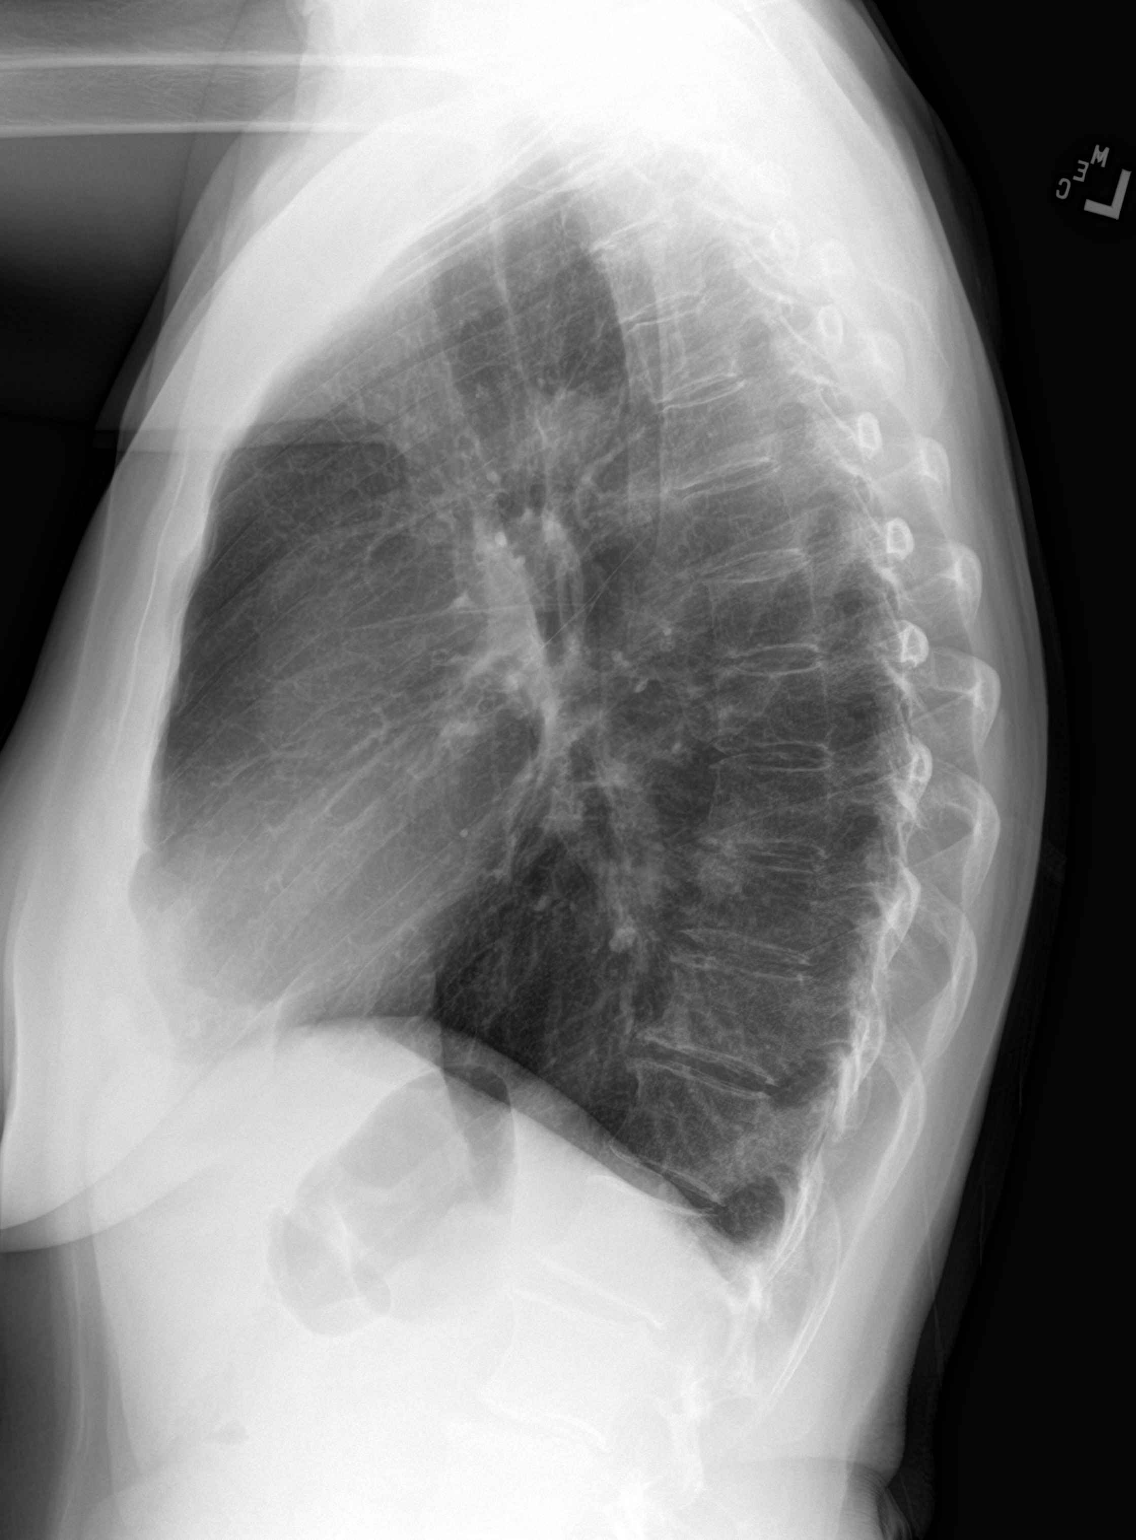

[2 of 2 positions shown; findings below may reference images not displayed]

FINDINGS: Cardiac size is within normal limits. Increase in AP diameter of
chest suggests COPD. There are no signs of pulmonary edema or focal
pulmonary consolidation. There is calcific density in the posterior
left lower lung fields, possibly a granuloma. There is no
significant pleural effusion or pneumothorax.
IMPRESSION: COPD. There are no signs of pulmonary edema or focal pulmonary
consolidation.

## 2022-09-02 ENCOUNTER — Other Ambulatory Visit: Payer: Self-pay | Admitting: Family

## 2022-09-02 ENCOUNTER — Encounter: Payer: Self-pay | Admitting: Gastroenterology

## 2022-09-02 DIAGNOSIS — G2581 Restless legs syndrome: Secondary | ICD-10-CM

## 2022-09-21 ENCOUNTER — Other Ambulatory Visit: Payer: Self-pay | Admitting: Family

## 2022-09-21 DIAGNOSIS — E785 Hyperlipidemia, unspecified: Secondary | ICD-10-CM

## 2022-09-29 ENCOUNTER — Other Ambulatory Visit (INDEPENDENT_AMBULATORY_CARE_PROVIDER_SITE_OTHER): Payer: Self-pay | Admitting: Nurse Practitioner

## 2022-09-29 DIAGNOSIS — I7143 Infrarenal abdominal aortic aneurysm, without rupture: Secondary | ICD-10-CM

## 2022-09-30 NOTE — Progress Notes (Unsigned)
MRN : [id number]  Michelle Walsh is a 67 y.o. (1956-04-02) female who presents with chief complaint of check circulation.  History of Present Illness:   The patient presents to the office for evaluation of an abdominal aortic aneurysm. The aneurysm was found incidentally by CT scan. Patient denies abdominal pain or unusual back pain, no other abdominal complaints.  No history of an abrupt onset of a painful toe associated with blue discoloration.     No family history of AAA.   Patient denies amaurosis fugax or TIA symptoms. There is no history of claudication or rest pain symptoms of the lower extremities.  The patient denies angina or shortness of breath.  CT scan shows an AAA that measures 5.00 cm  No outpatient medications have been marked as taking for the 10/01/22 encounter (Appointment) with Gilda Crease, Latina Craver, MD.    Past Medical History:  Diagnosis Date   Anxiety    Chronic pain of right knee    Depression    Osteoarthritis    Restless leg syndrome    Tremors of nervous system    Vitamin D deficiency     Past Surgical History:  Procedure Laterality Date   CESAREAN SECTION     COLONOSCOPY WITH PROPOFOL N/A 08/06/2022   Procedure: COLONOSCOPY WITH PROPOFOL;  Surgeon: Wyline Mood, MD;  Location: [facility] ENDOSCOPY;  Service: Gastroenterology;  Laterality: N/A;   EYE SURGERY Bilateral 2024   teeth all removed      Social History Social History   Tobacco Use   Smoking status: Former    Types: Cigarettes    Quit date: 04/27/1988    Years since quitting: 34.4   Smokeless tobacco: Never  Vaping Use   Vaping Use: Some days  Substance Use Topics   Alcohol use: Yes    Comment: very rare - 1 time per year   Drug use: Yes    Types: Marijuana    Comment: does it thru a vape - uses THC - "helps with anxiety"    Family History Family History  Problem Relation Age of Onset   Dementia Mother 67    Seizures Father    Heart attack Sister        MI; she was step sister, 74 years old   Breast cancer Maternal Grandmother 78    Allergies  Allergen Reactions   Ciprofloxacin     Sick with C.Diff for 2 months after taking medication   Codeine Nausea And Vomiting     REVIEW OF SYSTEMS (Negative unless checked)  Constitutional: [] Weight loss  [] Fever  [] Chills Cardiac: [] Chest pain   [] Chest pressure   [] Palpitations   [] Shortness of breath when laying flat   [] Shortness of breath with exertion. Vascular:  [x] Pain in legs with walking   [] Pain in legs at rest  [] History of DVT   [] Phlebitis   [] Swelling in legs   [] Varicose veins   [] Non-healing ulcers Pulmonary:   [] Uses home oxygen   [] Productive cough   [] Hemoptysis   [] Wheeze  [] COPD   [] Asthma Neurologic:  [] Dizziness   [] Seizures   []   History of stroke   [] History of TIA  [] Aphasia   [] Vissual changes   [] Weakness or numbness in arm   [] Weakness or numbness in leg Musculoskeletal:   [] Joint swelling   [] Joint pain   [] Low back pain Hematologic:  [] Easy bruising  [] Easy bleeding   [] Hypercoagulable state   [] Anemic Gastrointestinal:  [] Diarrhea   [] Vomiting  [] Gastroesophageal reflux/heartburn   [] Difficulty swallowing. Genitourinary:  [] Chronic kidney disease   [] Difficult urination  [] Frequent urination   [] Blood in urine Skin:  [] Rashes   [] Ulcers  Psychological:  [] History of anxiety   []  History of major depression.  Physical Examination  There were no vitals filed for this visit. There is no height or weight on file to calculate BMI. Gen: WD/WN, NAD Head: [place]/AT, No temporalis wasting.  Ear/Nose/Throat: Hearing grossly intact, nares w/o erythema or drainage Eyes: PER, EOMI, sclera nonicteric.  Neck: Supple, no masses.  No bruit or JVD.  Pulmonary:  Good air movement, no audible wheezing, no use of accessory muscles.  Cardiac: RRR, normal S1, S2, no Murmurs. Vascular:  mild trophic changes, no open wounds Vessel Right Left   Radial Palpable Palpable  PT Not Palpable Not Palpable  DP Not Palpable Not Palpable  Gastrointestinal: soft, non-distended. No guarding/no peritoneal signs.  Musculoskeletal: M/S 5/5 throughout.  No visible deformity.  Neurologic: CN 2-12 intact. Pain and light touch intact in extremities.  Symmetrical.  Speech is fluent. Motor exam as listed above. Psychiatric: Judgment intact, Mood & affect appropriate for pt's clinical situation. Dermatologic: No rashes or ulcers noted.  No changes consistent with cellulitis.   CBC Lab Results  Component Value Date   WBC 14.4 (H) 05/06/2022   HGB 12.3 05/06/2022   HCT 39.9 05/06/2022   MCV 95.5 05/06/2022   PLT 283 05/06/2022    BMET    Component Value Date/Time   NA 139 05/06/2022 0337   NA 139 09/15/2013 1214   K 3.5 05/06/2022 0337   K 3.3 (L) 09/15/2013 1214   CL 103 05/06/2022 0337   CL 103 09/15/2013 1214   CO2 23 05/06/2022 0337   CO2 31 09/15/2013 1214   GLUCOSE 182 (H) 05/06/2022 0337   GLUCOSE 103 (H) 09/15/2013 1214   BUN 16 05/06/2022 0337   BUN 8 09/15/2013 1214   CREATININE 1.17 (H) 05/06/2022 0337   CREATININE 0.89 09/15/2013 1214   CALCIUM 8.9 05/06/2022 0337   CALCIUM 9.1 09/15/2013 1214   GFRNONAA 51 (L) 05/06/2022 0337   GFRNONAA >60 09/15/2013 1214   GFRAA >60 09/15/2013 1214   CrCl cannot be calculated (Patient's most recent lab result is older than the maximum 21 days allowed.).  COAG Lab Results  Component Value Date   INR 1.1 12/23/2020    Radiology No results found.   Assessment/Plan There are no diagnoses linked to this encounter.   Levora Dredge, MD  09/30/2022 11:26 AM

## 2022-10-01 ENCOUNTER — Encounter (INDEPENDENT_AMBULATORY_CARE_PROVIDER_SITE_OTHER): Payer: Self-pay | Admitting: Vascular Surgery

## 2022-10-01 ENCOUNTER — Ambulatory Visit (INDEPENDENT_AMBULATORY_CARE_PROVIDER_SITE_OTHER): Payer: PPO

## 2022-10-01 ENCOUNTER — Ambulatory Visit (INDEPENDENT_AMBULATORY_CARE_PROVIDER_SITE_OTHER): Payer: PPO | Admitting: Vascular Surgery

## 2022-10-01 VITALS — BP 109/70 | HR 62 | Resp 18 | Ht 64.0 in | Wt 115.6 lb

## 2022-10-01 DIAGNOSIS — I7143 Infrarenal abdominal aortic aneurysm, without rupture: Secondary | ICD-10-CM

## 2022-10-01 DIAGNOSIS — I700 Atherosclerosis of aorta: Secondary | ICD-10-CM

## 2022-10-01 DIAGNOSIS — I714 Abdominal aortic aneurysm, without rupture, unspecified: Secondary | ICD-10-CM | POA: Diagnosis not present

## 2022-10-01 DIAGNOSIS — N182 Chronic kidney disease, stage 2 (mild): Secondary | ICD-10-CM | POA: Diagnosis not present

## 2022-10-01 DIAGNOSIS — J449 Chronic obstructive pulmonary disease, unspecified: Secondary | ICD-10-CM

## 2022-10-01 DIAGNOSIS — M159 Polyosteoarthritis, unspecified: Secondary | ICD-10-CM | POA: Diagnosis not present

## 2022-12-20 ENCOUNTER — Other Ambulatory Visit: Payer: Self-pay | Admitting: Family

## 2022-12-20 DIAGNOSIS — F418 Other specified anxiety disorders: Secondary | ICD-10-CM

## 2023-01-01 DIAGNOSIS — F3341 Major depressive disorder, recurrent, in partial remission: Secondary | ICD-10-CM | POA: Diagnosis not present

## 2023-01-01 DIAGNOSIS — R251 Tremor, unspecified: Secondary | ICD-10-CM | POA: Diagnosis not present

## 2023-01-01 DIAGNOSIS — J449 Chronic obstructive pulmonary disease, unspecified: Secondary | ICD-10-CM | POA: Diagnosis not present

## 2023-01-01 DIAGNOSIS — Z681 Body mass index (BMI) 19 or less, adult: Secondary | ICD-10-CM | POA: Diagnosis not present

## 2023-01-06 ENCOUNTER — Telehealth: Payer: Self-pay | Admitting: Family

## 2023-01-06 NOTE — Telephone Encounter (Signed)
Copied from CRM [id number]. Topic: Medicare AWV >> Jan 06, 2023  2:47 PM Payton Doughty wrote: Reason for CRM: Called 01/06/2023 to sched AWV - NO VOICEMAIL  Verlee Rossetti; Care Guide Ambulatory Clinical Support [place] [facility] Direct Dial: [phone]

## 2023-01-08 ENCOUNTER — Other Ambulatory Visit (INDEPENDENT_AMBULATORY_CARE_PROVIDER_SITE_OTHER): Payer: PPO

## 2023-01-08 DIAGNOSIS — R319 Hematuria, unspecified: Secondary | ICD-10-CM | POA: Diagnosis not present

## 2023-01-08 DIAGNOSIS — E785 Hyperlipidemia, unspecified: Secondary | ICD-10-CM | POA: Diagnosis not present

## 2023-01-08 DIAGNOSIS — E538 Deficiency of other specified B group vitamins: Secondary | ICD-10-CM | POA: Diagnosis not present

## 2023-01-08 LAB — CBC WITH DIFFERENTIAL/PLATELET
Basophils Absolute: 0.1 10*3/uL (ref 0.0–0.1)
Basophils Relative: 1.1 % (ref 0.0–3.0)
Eosinophils Absolute: 0.2 10*3/uL (ref 0.0–0.7)
Eosinophils Relative: 3 % (ref 0.0–5.0)
HCT: 39 % (ref 36.0–46.0)
Hemoglobin: 12.4 g/dL (ref 12.0–15.0)
Lymphocytes Relative: 24.7 % (ref 12.0–46.0)
Lymphs Abs: 2 10*3/uL (ref 0.7–4.0)
MCHC: 31.8 g/dL (ref 30.0–36.0)
MCV: 92.7 fl (ref 78.0–100.0)
Monocytes Absolute: 0.6 10*3/uL (ref 0.1–1.0)
Monocytes Relative: 7.8 % (ref 3.0–12.0)
Neutro Abs: 5.3 10*3/uL (ref 1.4–7.7)
Neutrophils Relative %: 63.4 % (ref 43.0–77.0)
Platelets: 293 10*3/uL (ref 150.0–400.0)
RBC: 4.21 Mil/uL (ref 3.87–5.11)
RDW: 13.6 % (ref 11.5–15.5)
WBC: 8.3 10*3/uL (ref 4.0–10.5)

## 2023-01-08 LAB — HEPATIC FUNCTION PANEL
ALT: 13 U/L (ref 0–35)
AST: 15 U/L (ref 0–37)
Albumin: 4 g/dL (ref 3.5–5.2)
Alkaline Phosphatase: 39 U/L (ref 39–117)
Bilirubin, Direct: 0.1 mg/dL (ref 0.0–0.3)
Total Bilirubin: 0.5 mg/dL (ref 0.2–1.2)
Total Protein: 6.8 g/dL (ref 6.0–8.3)

## 2023-01-08 LAB — URINALYSIS, ROUTINE W REFLEX MICROSCOPIC
Leukocytes,Ua: NEGATIVE
Nitrite: NEGATIVE
Specific Gravity, Urine: 1.02 (ref 1.000–1.030)
Total Protein, Urine: NEGATIVE
Urine Glucose: NEGATIVE
Urobilinogen, UA: 1 (ref 0.0–1.0)
pH: 6 (ref 5.0–8.0)

## 2023-01-08 LAB — LIPID PANEL
Cholesterol: 136 mg/dL (ref 0–200)
HDL: 50.6 mg/dL (ref >39.00)
LDL Cholesterol: 71 mg/dL (ref 0–99)
NonHDL: 85.24
Total CHOL/HDL Ratio: 3
Triglycerides: 72 mg/dL (ref 0.0–149.0)
VLDL: 14.4 mg/dL (ref 0.0–40.0)

## 2023-01-08 LAB — B12 AND FOLATE PANEL
Folate: 22.8 ng/mL (ref >5.9)
Vitamin B-12: 335 pg/mL (ref 211–911)

## 2023-01-13 ENCOUNTER — Ambulatory Visit (INDEPENDENT_AMBULATORY_CARE_PROVIDER_SITE_OTHER): Payer: PPO | Admitting: *Deleted

## 2023-01-13 VITALS — Ht 64.0 in | Wt 114.0 lb

## 2023-01-13 DIAGNOSIS — Z0000 Encounter for general adult medical examination without abnormal findings: Secondary | ICD-10-CM | POA: Diagnosis not present

## 2023-01-13 DIAGNOSIS — Z1231 Encounter for screening mammogram for malignant neoplasm of breast: Secondary | ICD-10-CM

## 2023-01-13 NOTE — Progress Notes (Signed)
Subjective:   Michelle Walsh is a 67 y.o. female who presents for Medicare Annual (Subsequent) preventive examination.  Visit Complete: Virtual  I connected with  Michelle Walsh on 01/13/23 by a audio enabled telemedicine application and verified that I am speaking with the correct person using two identifiers.  Patient Location: Home  Provider Location: Office/Clinic  I discussed the limitations of evaluation and management by telemedicine. The patient expressed understanding and agreed to proceed.  Vital Signs: Unable to obtain new vitals due to this being a telehealth visit.  Cardiac Risk Factors include: advanced age (>77men, >29 women);dyslipidemia;Other (see comment), Risk factor comments: Bradycardia     Objective:    Today's Vitals   01/13/23 1017  Weight: 114 lb (51.7 kg)  Height: 5\' 4"  (1.626 m)   Body mass index is 19.57 kg/m.     01/13/2023   10:34 AM 08/06/2022    8:53 AM 01/09/2022    2:07 PM 01/08/2021   12:51 PM 12/23/2020    8:30 PM 12/18/2020   10:44 AM 11/15/2019    3:42 PM  Advanced Directives  Does Patient Have a Medical Advance Directive? No No No No No No No  Would patient like information on creating a medical advance directive? No - Patient declined  No - Patient declined No - Patient declined No - Patient declined  No - Patient declined    Current Medications (verified) Outpatient Encounter Medications as of 01/13/2023  Medication Sig   albuterol (VENTOLIN HFA) 108 (90 Base) MCG/ACT inhaler Inhale 1-2 puffs into the lungs every 6 (six) hours as needed for wheezing or shortness of breath.   alendronate (FOSAMAX) 70 MG tablet TAKE 1 TABLET BY MOUTH EVERY 7 (SEVEN) DAYS. TAKE WITH A FULL GLASS OF WATER ON AN EMPTY STOMACH.   busPIRone (BUSPAR) 10 MG tablet Take 1 tablet (10 mg total) by mouth 2 (two) times daily.   DULoxetine (CYMBALTA) 30 MG capsule TAKE 1 CAPSULE BY MOUTH EVERY DAY   DULoxetine (CYMBALTA) 60 MG capsule TAKE 1 CAPSULE BY MOUTH EVERY  DAY   rOPINIRole (REQUIP) 0.5 MG tablet TAKE 1 TABLET (0.5 MG TOTAL) BY MOUTH AT BEDTIME. TAKE WITH ROPINIROLE 2 MG AT BEDTIME   rOPINIRole (REQUIP) 2 MG tablet Take 1 tablet (2 mg total) by mouth at bedtime.   rosuvastatin (CRESTOR) 20 MG tablet Take 1 tablet (20 mg total) by mouth daily.   No facility-administered encounter medications on file as of 01/13/2023.    Allergies (verified) Ciprofloxacin and Codeine   History: Past Medical History:  Diagnosis Date   Anxiety    Chronic pain of right knee    Depression    Osteoarthritis    Restless leg syndrome    Tremors of nervous system    Vitamin D deficiency    Past Surgical History:  Procedure Laterality Date   CESAREAN SECTION     COLONOSCOPY WITH PROPOFOL N/A 08/06/2022   Procedure: COLONOSCOPY WITH PROPOFOL;  Surgeon: Wyline Mood, MD;  Location: [facility] ENDOSCOPY;  Service: Gastroenterology;  Laterality: N/A;   EYE SURGERY Bilateral 2024   teeth all removed     Family History  Problem Relation Age of Onset   Dementia Mother 46   Seizures Father    Heart attack Sister        MI; she was step sister, 32 years old   Breast cancer Maternal Grandmother 25   Social History   Socioeconomic History   Marital status: Married  Spouse name: Not on file   Number of children: 2   Years of education: 88   Highest education level: Not on file  Occupational History   Occupation: retired  Tobacco Use   Smoking status: Former    Current packs/day: 0.00    Types: Cigarettes    Quit date: 04/27/1988    Years since quitting: 34.7   Smokeless tobacco: Never   Tobacco comments:    vapes  Vaping Use   Vaping status: Some Days  Substance and Sexual Activity   Alcohol use: Yes    Comment: very rare - 1 time per year   Drug use: Yes    Types: Marijuana    Comment: does it thru a vape - uses THC - "helps with anxiety"   Sexual activity: Not on file  Other Topics Concern   Not on file  Social History Narrative   2 children; one  child lives with her and her 4 children   Right handed   One story home   Social Determinants of Health   Financial Resource Strain: Low Risk  (01/13/2023)   Overall Financial Resource Strain (CARDIA)    Difficulty of Paying Living Expenses: Not hard at all  Food Insecurity: No Food Insecurity (01/13/2023)   Hunger Vital Sign    Worried About Running Out of Food in the Last Year: Never true    Ran Out of Food in the Last Year: Never true  Transportation Needs: No Transportation Needs (01/13/2023)   PRAPARE - Administrator, Civil Service (Medical): No    Lack of Transportation (Non-Medical): No  Physical Activity: Inactive (01/13/2023)   Exercise Vital Sign    Days of Exercise per Week: 0 days    Minutes of Exercise per Session: 0 min  Stress: No Stress Concern Present (01/13/2023)   [employer] of Occupational Health - Occupational Stress Questionnaire    Feeling of Stress : Only a little  Social Connections: Socially Integrated (01/13/2023)   Social Connection and Isolation Panel [NHANES]    Frequency of Communication with Friends and Family: More than three times a week    Frequency of Social Gatherings with Friends and Family: More than three times a week    Attends Religious Services: More than 4 times per year    Active Member of [employer] or Organizations: No    Attends Engineer, structural: More than 4 times per year    Marital Status: Married    Tobacco Counseling Counseling given: Not Answered Tobacco comments: vapes   Clinical Intake:  Pre-visit preparation completed: Yes  Pain : No/denies pain     BMI - recorded: 19.57 Nutritional Status: BMI of 19-24  Normal Nutritional Risks: None Diabetes: No  How often do you need to have someone help you when you read instructions, pamphlets, or other written materials from your doctor or pharmacy?: 1 - Never  Interpreter Needed?: No  Information entered by :: R. Haydn Cush LPN   Activities of  Daily Living    01/13/2023   10:22 AM  In your present state of health, do you have any difficulty performing the following activities:  Hearing? 1  Comment some difficulty  Vision? 0  Comment readers  Difficulty concentrating or making decisions? 1  Comment remembering certain things  Walking or climbing stairs? 0  Dressing or bathing? 0  Doing errands, shopping? 0  Preparing Food and eating ? N  Using the Toilet? N  In the past six  months, have you accidently leaked urine? Y  Comment wears pads at times  Do you have problems with loss of bowel control? N  Managing your Medications? N  Managing your Finances? N  Housekeeping or managing your Housekeeping? N    Patient Care Team: Allegra Grana, FNP as PCP - General (Family Medicine) Jim Like, RN as Registered Nurse Scarlett Presto, RN (Inactive) as Registered Nurse Tat, Octaviano Batty, DO as Consulting Physician (Neurology)  Indicate any recent Medical Services you may have received from other than [facility] providers in the past year (date may be approximate).     Assessment:   This is a routine wellness examination for [place].  Hearing/Vision screen Hearing Screening - Comments:: [place] difficulty Vision Screening - Comments:: readers   Goals Addressed             This Visit's Progress    Patient Stated       Wants to eat better       Depression Screen    01/13/2023   10:29 AM 02/25/2022    9:09 AM 01/09/2022    2:12 PM 10/21/2021   10:39 AM 08/08/2021    9:38 AM 08/04/2021   10:07 AM 07/28/2021    3:19 PM  PHQ 2/9 Scores  PHQ - 2 Score 0 2 0 0 0 0 0  PHQ- 9 Score 1 5    0     Fall Risk    01/13/2023   10:26 AM 07/17/2022   10:43 AM 02/25/2022    9:08 AM 01/09/2022    2:13 PM 10/21/2021   10:38 AM  Fall Risk   Falls in the past year? 1 0 1 0 1  Number falls in past yr: 0 0 0 0 0  Injury with Fall? 0 0 0 0 0  Risk for fall due to : History of fall(s);Impaired balance/gait No Fall Risks Other (Comment)  No Fall Risks History of fall(s)  Follow up Falls prevention discussed;Falls evaluation completed Falls evaluation completed Falls evaluation completed Falls evaluation completed Falls evaluation completed    MEDICARE RISK AT HOME: Medicare Risk at Home Any stairs in or around the home?: Yes If so, are there any without handrails?: No Home free of loose throw rugs in walkways, pet beds, electrical cords, etc?: Yes Adequate lighting in your home to reduce risk of falls?: Yes Life alert?: No Use of a cane, walker or w/c?: No Grab bars in the bathroom?: Yes Shower chair or bench in shower?: No Elevated toilet seat or a handicapped toilet?: No   Cognitive Function:    03/12/2021   12:29 PM  MMSE - Mini Mental State Exam  Orientation to time 5  Orientation to Place 5  Registration 3  Attention/ Calculation 5  Recall 2  Language- name 2 objects 2  Language- repeat 1  Language- follow 3 step command 3  Language- read & follow direction 1  Write a sentence 1  Copy design 1  Total score 29        01/13/2023   10:34 AM 01/09/2022    2:17 PM 11/15/2019    3:50 PM  6CIT Screen  What Year? 0 points 0 points 0 points  What month? 0 points 0 points 0 points  What time? 0 points 0 points   Count back from 20 0 points 0 points   Months in reverse 0 points 0 points 0 points  Repeat phrase 0 points 0 points 0 points  Total  Score 0 points 0 points     Immunizations Immunization History  Administered Date(s) Administered   Fluad Quad(high Dose 65+) 02/13/2020, 01/03/2021, 02/25/2022   Influenza, High Dose Seasonal PF 03/02/2019   Moderna Sars-Covid-2 Vaccination 07/17/2019, 08/14/2019, 05/03/2020   Pneumococcal Conjugate-13 11/17/2018   Pneumococcal Polysaccharide-23 12/25/2019   Zoster Recombinant(Shingrix) 03/12/2020, 06/25/2020    TDAP status: Due, Education has been provided regarding the importance of this vaccine. Advised may receive this vaccine at local pharmacy or  Health Dept. Aware to provide a copy of the vaccination record if obtained from local pharmacy or Health Dept. Verbalized acceptance and understanding.  Flu Vaccine status: Due, Education has been provided regarding the importance of this vaccine. Advised may receive this vaccine at local pharmacy or Health Dept. Aware to provide a copy of the vaccination record if obtained from local pharmacy or Health Dept. Verbalized acceptance and understanding.  Pneumococcal vaccine status: Up to date  Covid-19 vaccine status: Information provided on how to obtain vaccines.   Qualifies for Shingles Vaccine? Yes   Zostavax completed No   Shingrix Completed?: Yes  Screening Tests Health Maintenance  Topic Date Due   DTaP/Tdap/Td (1 - Tdap) Never done   MAMMOGRAM  06/28/2021   INFLUENZA VACCINE  11/26/2022   COVID-19 Vaccine (4 - 2023-24 season) 12/27/2022   Medicare Annual Wellness (AWV)  01/10/2023   Colonoscopy  08/05/2032   Pneumonia Vaccine 21+ Years old  Completed   DEXA SCAN  Completed   Hepatitis C Screening  Completed   Zoster Vaccines- Shingrix  Completed   HPV VACCINES  Aged Out    Health Maintenance  Health Maintenance Due  Topic Date Due   DTaP/Tdap/Td (1 - Tdap) Never done   MAMMOGRAM  06/28/2021   INFLUENZA VACCINE  11/26/2022   COVID-19 Vaccine (4 - 2023-24 season) 12/27/2022   Medicare Annual Wellness (AWV)  01/10/2023    Colorectal cancer screening: Type of screening: Colonoscopy. Completed 4/24. Repeat every ? years Patient stated that she has to repeat soon because she was not cleaned out real good.  Mammogram status: Ordered 01/13/23. Pt provided with contact info and advised to call to schedule appt.   Bone Density status: Ordered 07/17/22. Pt provided with contact info and advised to call to schedule appt.  Lung Cancer Screening: (Low Dose CT Chest recommended if Age 4-80 years, 20 pack-year currently smoking OR have quit w/in 15years.) does not qualify.      Additional Screening:  Hepatitis C Screening: does qualify; Completed 6/21  Vision Screening: Recommended annual ophthalmology exams for early detection of glaucoma and other disorders of the eye. Is the patient up to date with their annual eye exam?  Yes  Who is the provider or what is the name of the office in which the patient attends annual eye exams? Dr. Dyke Maes  If pt is not established with a provider, would they like to be referred to a provider to establish care? No .   Dental Screening: Recommended annual dental exams for proper oral hygiene    Community Resource Referral / Chronic Care Management: CRR required this visit?  No   CCM required this visit?  No     Plan:     I have personally reviewed and noted the following in the patient's chart:   Medical and social history Use of alcohol, tobacco or illicit drugs  Current medications and supplements including opioid prescriptions. Patient is not currently taking opioid prescriptions. Functional ability and status Nutritional status  Physical activity Advanced directives List of other physicians Hospitalizations, surgeries, and ER visits in previous 12 months Vitals Screenings to include cognitive, depression, and falls Referrals and appointments  In addition, I have reviewed and discussed with patient certain preventive protocols, quality metrics, and best practice recommendations. A written personalized care plan for preventive services as well as general preventive health recommendations were provided to patient.     Sydell Axon, LPN   05/27/8655   After Visit Summary: (MyChart) Due to this being a telephonic visit, the after visit summary with patients personalized plan was offered to patient via MyChart   Nurse Notes: None

## 2023-01-13 NOTE — Patient Instructions (Signed)
Michelle Walsh , Thank you for taking time to come for your Medicare Wellness Visit. I appreciate your ongoing commitment to your health goals. Please review the following plan we discussed and let me know if I can assist you in the future.   Referrals/Orders/Follow-Ups/Clinician Recommendations: Mammogram  This is a list of the screening recommended for you and due dates:  Health Maintenance  Topic Date Due   DTaP/Tdap/Td vaccine (1 - Tdap) Never done   Mammogram  06/28/2021   Flu Shot  11/26/2022   COVID-19 Vaccine (4 - 2023-24 season) 12/27/2022   Medicare Annual Wellness Visit  01/13/2024   Colon Cancer Screening  08/05/2032   Pneumonia Vaccine  Completed   DEXA scan (bone density measurement)  Completed   Hepatitis C Screening  Completed   Zoster (Shingles) Vaccine  Completed   HPV Vaccine  Aged Out    Advanced directives: (Declined) Advance directive discussed with you today. Even though you declined this today, please call our office should you change your mind, and we can give you the proper paperwork for you to fill out.  Next Medicare Annual Wellness Visit scheduled for next year: Yes 01/17/23 @ 9:00

## 2023-01-18 NOTE — Addendum Note (Signed)
Addended by: Sydell Axon C on: 01/18/2023 08:03 AM   Modules accepted: Orders

## 2023-01-21 ENCOUNTER — Encounter: Payer: Self-pay | Admitting: *Deleted

## 2023-03-12 ENCOUNTER — Other Ambulatory Visit: Payer: Self-pay | Admitting: Family

## 2023-03-12 ENCOUNTER — Other Ambulatory Visit: Payer: Self-pay

## 2023-03-12 DIAGNOSIS — M810 Age-related osteoporosis without current pathological fracture: Secondary | ICD-10-CM

## 2023-03-12 DIAGNOSIS — M8580 Other specified disorders of bone density and structure, unspecified site: Secondary | ICD-10-CM

## 2023-03-12 NOTE — Telephone Encounter (Signed)
Spoke to pt and informed her that her Dexa scan was overdue, I ordered scan pt will get done when she has her mammogram done

## 2023-03-18 ENCOUNTER — Other Ambulatory Visit: Payer: Self-pay | Admitting: Family

## 2023-03-18 DIAGNOSIS — F418 Other specified anxiety disorders: Secondary | ICD-10-CM

## 2023-03-24 ENCOUNTER — Ambulatory Visit
Admission: RE | Admit: 2023-03-24 | Discharge: 2023-03-24 | Disposition: A | Discharge status: Home / Self Care | Payer: PPO | Source: Ambulatory Visit | Attending: Family | Admitting: Family

## 2023-03-24 DIAGNOSIS — Z1231 Encounter for screening mammogram for malignant neoplasm of breast: Secondary | ICD-10-CM | POA: Diagnosis not present

## 2023-03-29 ENCOUNTER — Other Ambulatory Visit: Payer: Self-pay | Admitting: Family

## 2023-03-29 DIAGNOSIS — R928 Other abnormal and inconclusive findings on diagnostic imaging of breast: Secondary | ICD-10-CM

## 2023-04-01 ENCOUNTER — Ambulatory Visit
Admission: RE | Admit: 2023-04-01 | Discharge: 2023-04-01 | Disposition: A | Discharge status: Home / Self Care | Payer: PPO | Source: Ambulatory Visit | Attending: Family | Admitting: Family

## 2023-04-01 DIAGNOSIS — R92333 Mammographic heterogeneous density, bilateral breasts: Secondary | ICD-10-CM | POA: Diagnosis not present

## 2023-04-01 DIAGNOSIS — N6322 Unspecified lump in the left breast, upper inner quadrant: Secondary | ICD-10-CM | POA: Diagnosis not present

## 2023-04-01 DIAGNOSIS — R928 Other abnormal and inconclusive findings on diagnostic imaging of breast: Secondary | ICD-10-CM | POA: Diagnosis not present

## 2023-04-01 DIAGNOSIS — N6325 Unspecified lump in the left breast, overlapping quadrants: Secondary | ICD-10-CM | POA: Diagnosis not present

## 2023-04-01 DIAGNOSIS — N6002 Solitary cyst of left breast: Secondary | ICD-10-CM | POA: Diagnosis not present

## 2023-04-08 ENCOUNTER — Other Ambulatory Visit: Payer: Self-pay | Admitting: Family

## 2023-04-08 DIAGNOSIS — G2581 Restless legs syndrome: Secondary | ICD-10-CM

## 2023-06-14 ENCOUNTER — Other Ambulatory Visit: Payer: Self-pay | Admitting: Family

## 2023-06-14 DIAGNOSIS — I700 Atherosclerosis of aorta: Secondary | ICD-10-CM

## 2023-06-14 DIAGNOSIS — F418 Other specified anxiety disorders: Secondary | ICD-10-CM

## 2023-06-21 ENCOUNTER — Encounter: Payer: Self-pay | Admitting: Family

## 2023-06-21 ENCOUNTER — Ambulatory Visit (INDEPENDENT_AMBULATORY_CARE_PROVIDER_SITE_OTHER): Payer: PPO | Admitting: Family

## 2023-06-21 VITALS — BP 116/76 | HR 67 | Temp 97.8°F | Ht 63.0 in | Wt 109.4 lb

## 2023-06-21 DIAGNOSIS — E538 Deficiency of other specified B group vitamins: Secondary | ICD-10-CM

## 2023-06-21 DIAGNOSIS — J301 Allergic rhinitis due to pollen: Secondary | ICD-10-CM | POA: Diagnosis not present

## 2023-06-21 DIAGNOSIS — G250 Essential tremor: Secondary | ICD-10-CM

## 2023-06-21 DIAGNOSIS — Z23 Encounter for immunization: Secondary | ICD-10-CM | POA: Diagnosis not present

## 2023-06-21 MED ORDER — FLUTICASONE PROPIONATE 50 MCG/ACT NA SUSP
2.0000 | Freq: Every day | NASAL | 6 refills | Status: DC
Start: 2023-06-21 — End: 2023-12-14

## 2023-06-21 MED ORDER — PRIMIDONE 50 MG PO TABS
25.0000 mg | ORAL_TABLET | Freq: Every day | ORAL | 3 refills | Status: AC
Start: 2023-06-21 — End: ?

## 2023-06-21 MED ORDER — CYANOCOBALAMIN 1000 MCG/ML IJ SOLN
1000.0000 ug | Freq: Once | INTRAMUSCULAR | Status: AC
Start: 2023-06-21 — End: 2023-06-21
  Administered 2023-06-21: 1000 ug via INTRAMUSCULAR

## 2023-06-21 NOTE — Progress Notes (Signed)
 Assessment & Plan:  Essential tremor Assessment & Plan: Chronic, worsening.  No symptoms to suggest Parkinson's at this time.  Restart primidone 25 mg nightly.  Higher doses aggravated depression.  Will closely monitor.  Referral to [facility] neurology for evaluation, second opinion.   Orders: -     Primidone; Take 0.5 tablets (25 mg total) by mouth at bedtime.  Dispense: 90 tablet; Refill: 3 -     Ambulatory referral to Neurology -     TSH; Future  Seasonal allergic rhinitis due to pollen -     Fluticasone Propionate; Place 2 sprays into both nostrils daily.  Dispense: 16 g; Refill: 6  B12 deficiency Assessment & Plan: History of B12 deficiency.  Patient would like resume today.  B12 IM given today and she may schedule monthly B12 injection indefinitely due to preference.  May consider transition to oral.   Orders: -     Cyanocobalamin -     B12 and Folate Panel; Future  Need for influenza vaccination -     Flu Vaccine Trivalent High Dose (Fluad)     Return precautions given.   Risks, benefits, and alternatives of the medications and treatment plan prescribed today were discussed, and patient expressed understanding.   Education regarding symptom management and diagnosis given to patient on AVS either electronically or printed.  Return in about 2 months (around 08/19/2023).  Rennie Plowman, FNP  Subjective:    Patient ID: Michelle Walsh, female    DOB: Mar 15, 1956, 68 y.o.   MRN: [id number]  CC: Michelle Walsh is a 68 y.o. female who presents today for follow up.   HPI: Complains of worsening bilateral hand tremor.  Difficult to write.  Affecting her quality of life.  She does not think tremor affects ability to eat, prepare food.  She is less bothered by the tremor in her neck and shoulders Tremor is  more pronounced with anxiety.  She stopped primidone 50 mg as she felt it her feel "strange" with depression.  Denies shuffling gait, depression  History of B12  deficiency.  She felt better on B12 injection.  She would like to resume today   Last seen by vascular 09/2022 for aortic atherosclerosis, abdominal aneurysm.  Continued surveillance of abdominal aneurysm Restless leg syndrome-compliant with Requip 2.5 mg nightly or she may take 2mg  at bedtime and the 0.5mg  every day during the day.  She feels this regimen is working quite well   Last seen by neurology, Dr. Arbutus Leas  at 02/14/2019 .  Discussed Botox for cervical dystonia.  Started primidone 50 mg for 1 week.  Continued on propranolol 60 mg twice daily.   MRI brain 01/21/2019 without acute findings  Previously tried propranolol 60 mg twice daily with poor control.   Seen for sinus bradycardia, Dr. Lalla Brothers 03/20/2020  She has lost 5 lbs ; she has stopped drinking soda.   She is eating well; she is taking care of 4 grandchildren which keeps her active.   Colonoscopy is due Allergies: Ciprofloxacin and Codeine Current Outpatient Medications on File Prior to Visit  Medication Sig Dispense Refill   albuterol (VENTOLIN HFA) 108 (90 Base) MCG/ACT inhaler Inhale 1-2 puffs into the lungs every 6 (six) hours as needed for wheezing or shortness of breath. 18 g 11   alendronate (FOSAMAX) 70 MG tablet TAKE 1 TABLET BY MOUTH EVERY 7 (SEVEN) DAYS. TAKE WITH A FULL GLASS OF WATER ON AN EMPTY STOMACH. 12 tablet 3   busPIRone (BUSPAR) 10  MG tablet Take 1 tablet (10 mg total) by mouth 2 (two) times daily. 180 tablet 3   DULoxetine (CYMBALTA) 30 MG capsule TAKE 1 CAPSULE BY MOUTH EVERY DAY 90 capsule 1   DULoxetine (CYMBALTA) 60 MG capsule TAKE 1 CAPSULE BY MOUTH EVERY DAY 90 capsule 3   rOPINIRole (REQUIP) 0.5 MG tablet TAKE 1 TABLET (0.5 MG TOTAL) BY MOUTH AT BEDTIME. TAKE WITH ROPINIROLE 2 MG AT BEDTIME 90 tablet 3   rOPINIRole (REQUIP) 2 MG tablet TAKE 1 TABLET BY MOUTH AT BEDTIME. 90 tablet 0   rosuvastatin (CRESTOR) 20 MG tablet TAKE 1 TABLET BY MOUTH EVERY DAY 90 tablet 1   No current  facility-administered medications on file prior to visit.    Review of Systems  Constitutional:  Negative for chills and fever.  Respiratory:  Negative for cough.   Cardiovascular:  Negative for chest pain and palpitations.  Gastrointestinal:  Negative for nausea and vomiting.  Neurological:  Positive for tremors.      Objective:    BP 116/76   Pulse 67   Temp 97.8 F (36.6 C) (Oral)   Ht 5\' 3"  (1.6 m)   Wt 109 lb 6.4 oz (49.6 kg)   SpO2 97%   BMI 19.38 kg/m  BP Readings from Last 3 Encounters:  06/21/23 116/76  10/01/22 109/70  08/06/22 (!) 102/56   Wt Readings from Last 3 Encounters:  06/21/23 109 lb 6.4 oz (49.6 kg)  01/13/23 114 lb (51.7 kg)  10/01/22 115 lb 9.6 oz (52.4 kg)    Physical Exam Vitals reviewed.  Constitutional:      Appearance: She is well-developed.  Eyes:     Conjunctiva/sclera: Conjunctivae normal.  Cardiovascular:     Rate and Rhythm: Normal rate and regular rhythm.     Pulses: Normal pulses.     Heart sounds: Normal heart sounds.  Pulmonary:     Effort: Pulmonary effort is normal.     Breath sounds: Normal breath sounds. No wheezing, rhonchi or rales.  Skin:    General: Skin is warm and dry.  Neurological:     Mental Status: She is alert.     Motor: Tremor present.     Comments: Moderate hand tremor bilaterally with hands extended. Lateral tilt of the neck throughout conversation.   No pill-rolling, shuffling gait.   cogwheeling absent  Psychiatric:        Speech: Speech normal.        Behavior: Behavior normal.        Thought Content: Thought content normal.

## 2023-06-21 NOTE — Patient Instructions (Addendum)
 Please call  and schedule your 3D mammogram and /or bone density scan as we discussed.   [facility]  ( new location in 2023)  [address], [place], [place] [address]  [place], [place]  [phone]   Increase calorically dense foods.   Please call [facility] at [phone].  You are overdue for your colonoscopy.  This is very important in the setting of weight loss.   Referral to neurology  Let us know if you dont hear back within a week in regards to an appointment being scheduled.   So that you are aware, if you are [facility] MyChart user , please pay attention to your MyChart messages as you may receive a MyChart message with a phone number to call and schedule this test/appointment own your own from our referral coordinator. This is a new process so I do not want you to miss this message.  If you are not a MyChart user, you will receive a phone call.

## 2023-06-21 NOTE — Assessment & Plan Note (Signed)
 History of B12 deficiency.  Patient would like resume today.  B12 IM given today and she may schedule monthly B12 injection indefinitely due to preference.  May consider transition to oral.

## 2023-06-21 NOTE — Assessment & Plan Note (Addendum)
 Chronic, worsening.  No symptoms to suggest Parkinson's at this time.  Restart primidone 25 mg nightly.  Higher doses aggravated depression.  Will closely monitor.  Referral to [facility] neurology for evaluation, second opinion.

## 2023-07-08 ENCOUNTER — Other Ambulatory Visit: Payer: Self-pay | Admitting: Family

## 2023-07-08 DIAGNOSIS — F418 Other specified anxiety disorders: Secondary | ICD-10-CM

## 2023-07-08 DIAGNOSIS — G2581 Restless legs syndrome: Secondary | ICD-10-CM

## 2023-07-19 ENCOUNTER — Ambulatory Visit (INDEPENDENT_AMBULATORY_CARE_PROVIDER_SITE_OTHER): Payer: PPO

## 2023-07-19 DIAGNOSIS — E538 Deficiency of other specified B group vitamins: Secondary | ICD-10-CM

## 2023-07-19 MED ORDER — CYANOCOBALAMIN 1000 MCG/ML IJ SOLN
1000.0000 ug | Freq: Once | INTRAMUSCULAR | Status: AC
Start: 2023-07-19 — End: 2023-07-19
  Administered 2023-07-19: 1000 ug via INTRAMUSCULAR

## 2023-07-19 NOTE — Progress Notes (Signed)
 Pt presented for their vitamin B12 injection. Pt was identified through two identifiers. Pt tolerated shot well in their left deltoid.

## 2023-08-09 ENCOUNTER — Telehealth: Payer: Self-pay | Admitting: Family

## 2023-08-09 NOTE — Telephone Encounter (Unsigned)
 Copied from CRM [id number]. Topic: General - Other >> Aug 09, 2023  9:49 AM Clyde Darling P wrote: Reason for CRM: Patient calling to advise she was to complete bone density test requested by PCP, however every time she contacts the facility to schedule, she continue to be routed elsewhere. Patient would like nurse to call to schedule before her upcoming appt with PCP.

## 2023-08-19 ENCOUNTER — Encounter: Payer: Self-pay | Admitting: Family

## 2023-08-19 ENCOUNTER — Ambulatory Visit (INDEPENDENT_AMBULATORY_CARE_PROVIDER_SITE_OTHER): Payer: PPO | Admitting: Family

## 2023-08-19 VITALS — BP 110/60 | HR 96 | Ht 64.0 in | Wt 113.4 lb

## 2023-08-19 DIAGNOSIS — I700 Atherosclerosis of aorta: Secondary | ICD-10-CM

## 2023-08-19 DIAGNOSIS — F418 Other specified anxiety disorders: Secondary | ICD-10-CM | POA: Diagnosis not present

## 2023-08-19 DIAGNOSIS — E538 Deficiency of other specified B group vitamins: Secondary | ICD-10-CM | POA: Diagnosis not present

## 2023-08-19 DIAGNOSIS — G2581 Restless legs syndrome: Secondary | ICD-10-CM

## 2023-08-19 DIAGNOSIS — G250 Essential tremor: Secondary | ICD-10-CM | POA: Diagnosis not present

## 2023-08-19 LAB — TSH: TSH: 2.36 u[IU]/mL (ref 0.35–5.50)

## 2023-08-19 LAB — B12 AND FOLATE PANEL
Folate: 20.7 ng/mL (ref >5.9)
Vitamin B-12: 488 pg/mL (ref 211–911)

## 2023-08-19 NOTE — Progress Notes (Signed)
 Assessment & Plan:  Restless leg syndrome Assessment & Plan: Chronic, stable.  Continue Requip  2 mg nightly.  She may take additional requip  0.5 mg dose as needed   Depression with anxiety  Aortic atherosclerosis (HCC)  B12 deficiency -     B12 and Folate Panel  Essential tremor Assessment & Plan: Chronic, stable . continue primidone  25mg .  Will await advice from Dr. Mason Sole next month.   Orders: -     TSH     Return precautions given.   Risks, benefits, and alternatives of the medications and treatment plan prescribed today were discussed, and patient expressed understanding.   Education regarding symptom management and diagnosis given to patient on AVS either electronically or printed.  Return in about 6 months (around 02/18/2024).  Bascom Bossier, FNP  Subjective:    Patient ID: Michelle Walsh, female    DOB: [birth year], 68 y.o.   MRN: [id number]  CC: Michelle Walsh is a 68 y.o. female who presents today for follow up.   HPI: Feels well today.  No new complaints   she would like b12 IM today  Due for colonoscopy d/t poor prep. She will call to schedule.   Restarted Primidone  25mg .  Tremor in hands is improved.  Cervical dystonia is unchanged.  Depression is not worse  Restless leg symptoms are overall well-controlled.  She is compliant with Requip  2 mg nightly.  She will take additional 0.5 mg dose as needed Pending referral to [facility] neurology; scheduled 09/21/23 Dr Mason Sole Allergies: Ciprofloxacin and Codeine Current Outpatient Medications on File Prior to Visit  Medication Sig Dispense Refill   albuterol  (VENTOLIN  HFA) 108 (90 Base) MCG/ACT inhaler Inhale 1-2 puffs into the lungs every 6 (six) hours as needed for wheezing or shortness of breath. 18 g 11   alendronate  (FOSAMAX ) 70 MG tablet TAKE 1 TABLET BY MOUTH EVERY 7 (SEVEN) DAYS. TAKE WITH A FULL GLASS OF WATER ON AN EMPTY STOMACH. 12 tablet 3   busPIRone  (BUSPAR ) 10 MG tablet TAKE 1 TABLET BY MOUTH TWICE A DAY  180 tablet 3   DULoxetine  (CYMBALTA ) 30 MG capsule TAKE 1 CAPSULE BY MOUTH EVERY DAY 90 capsule 1   DULoxetine  (CYMBALTA ) 60 MG capsule TAKE 1 CAPSULE BY MOUTH EVERY DAY 90 capsule 3   fluticasone  (FLONASE ) 50 MCG/ACT nasal spray Place 2 sprays into both nostrils daily. 16 g 6   primidone  (MYSOLINE ) 50 MG tablet Take 0.5 tablets (25 mg total) by mouth at bedtime. 90 tablet 3   rOPINIRole  (REQUIP ) 0.5 MG tablet TAKE 1 TABLET (0.5 MG TOTAL) BY MOUTH AT BEDTIME. TAKE WITH ROPINIROLE  2 MG AT BEDTIME 90 tablet 3   rOPINIRole  (REQUIP ) 2 MG tablet TAKE 1 TABLET BY MOUTH AT BEDTIME. 90 tablet 3   rosuvastatin  (CRESTOR ) 20 MG tablet TAKE 1 TABLET BY MOUTH EVERY DAY 90 tablet 1   No current facility-administered medications on file prior to visit.    Review of Systems  Constitutional:  Negative for chills and fever.  Respiratory:  Negative for cough.   Cardiovascular:  Negative for chest pain and palpitations.  Gastrointestinal:  Negative for nausea and vomiting.  Neurological:  Positive for tremors.      Objective:    BP 110/60   Pulse 96   Ht 5\' 4"  (1.626 m)   Wt 113 lb 6.4 oz (51.4 kg)   SpO2 100%   BMI 19.47 kg/m  BP Readings from Last 3 Encounters:  08/19/23 110/60  06/21/23 116/76  10/01/22 109/70   Wt Readings from Last 3 Encounters:  08/19/23 113 lb 6.4 oz (51.4 kg)  06/21/23 109 lb 6.4 oz (49.6 kg)  01/13/23 114 lb (51.7 kg)    Physical Exam Vitals reviewed.  Constitutional:      Appearance: She is well-developed.  Eyes:     Conjunctiva/sclera: Conjunctivae normal.  Cardiovascular:     Rate and Rhythm: Normal rate and regular rhythm.     Pulses: Normal pulses.     Heart sounds: Normal heart sounds.  Pulmonary:     Effort: Pulmonary effort is normal.     Breath sounds: Normal breath sounds. No wheezing, rhonchi or rales.  Skin:    General: Skin is warm and dry.  Neurological:     Mental Status: She is alert.     Comments:  Rhythmic tremor of neck, bilateral  hands at rest.   Psychiatric:        Speech: Speech normal.        Behavior: Behavior normal.        Thought Content: Thought content normal.

## 2023-08-19 NOTE — Assessment & Plan Note (Signed)
 Chronic, stable.  Continue Requip  2 mg nightly.  She may take additional requip  0.5 mg dose as needed

## 2023-08-19 NOTE — Patient Instructions (Signed)
Nice to see you!   

## 2023-08-19 NOTE — Assessment & Plan Note (Signed)
 Chronic, stable . continue primidone  25mg .  Will await advice from Dr. Mason Sole next month.

## 2023-09-02 ENCOUNTER — Encounter (HOSPITAL_COMMUNITY): Payer: Self-pay

## 2023-09-09 ENCOUNTER — Ambulatory Visit
Admission: RE | Admit: 2023-09-09 | Discharge: 2023-09-09 | Disposition: A | Discharge status: Home / Self Care | Source: Ambulatory Visit | Attending: Family | Admitting: Family

## 2023-09-09 DIAGNOSIS — M8589 Other specified disorders of bone density and structure, multiple sites: Secondary | ICD-10-CM | POA: Diagnosis not present

## 2023-09-09 DIAGNOSIS — M810 Age-related osteoporosis without current pathological fracture: Secondary | ICD-10-CM | POA: Diagnosis not present

## 2023-09-09 DIAGNOSIS — Z780 Asymptomatic menopausal state: Secondary | ICD-10-CM | POA: Diagnosis not present

## 2023-09-10 ENCOUNTER — Ambulatory Visit: Payer: Self-pay | Admitting: Family

## 2023-09-14 ENCOUNTER — Encounter (INDEPENDENT_AMBULATORY_CARE_PROVIDER_SITE_OTHER): Payer: Self-pay

## 2023-09-21 DIAGNOSIS — F418 Other specified anxiety disorders: Secondary | ICD-10-CM | POA: Diagnosis not present

## 2023-09-21 DIAGNOSIS — G243 Spasmodic torticollis: Secondary | ICD-10-CM | POA: Diagnosis not present

## 2023-09-21 DIAGNOSIS — G2581 Restless legs syndrome: Secondary | ICD-10-CM | POA: Diagnosis not present

## 2023-09-21 DIAGNOSIS — Z1331 Encounter for screening for depression: Secondary | ICD-10-CM | POA: Diagnosis not present

## 2023-09-21 DIAGNOSIS — G250 Essential tremor: Secondary | ICD-10-CM | POA: Diagnosis not present

## 2023-09-22 ENCOUNTER — Other Ambulatory Visit: Payer: Self-pay | Admitting: Family

## 2023-09-22 DIAGNOSIS — G2581 Restless legs syndrome: Secondary | ICD-10-CM

## 2023-10-01 ENCOUNTER — Encounter (INDEPENDENT_AMBULATORY_CARE_PROVIDER_SITE_OTHER): Payer: Self-pay | Admitting: Nurse Practitioner

## 2023-10-01 ENCOUNTER — Ambulatory Visit (INDEPENDENT_AMBULATORY_CARE_PROVIDER_SITE_OTHER): Payer: PPO | Admitting: Nurse Practitioner

## 2023-10-01 ENCOUNTER — Ambulatory Visit (INDEPENDENT_AMBULATORY_CARE_PROVIDER_SITE_OTHER): Payer: PPO

## 2023-10-01 VITALS — BP 103/59 | HR 53 | Resp 16 | Wt 115.6 lb

## 2023-10-01 DIAGNOSIS — E785 Hyperlipidemia, unspecified: Secondary | ICD-10-CM

## 2023-10-01 DIAGNOSIS — I700 Atherosclerosis of aorta: Secondary | ICD-10-CM | POA: Diagnosis not present

## 2023-10-01 DIAGNOSIS — M159 Polyosteoarthritis, unspecified: Secondary | ICD-10-CM | POA: Diagnosis not present

## 2023-10-01 DIAGNOSIS — I714 Abdominal aortic aneurysm, without rupture, unspecified: Secondary | ICD-10-CM

## 2023-10-01 DIAGNOSIS — I7143 Infrarenal abdominal aortic aneurysm, without rupture: Secondary | ICD-10-CM

## 2023-10-01 NOTE — Progress Notes (Signed)
 Subjective:    Patient ID: Michelle Walsh, female    DOB: 1955-08-10, 68 y.o.   MRN: [id number] Chief Complaint  Patient presents with   Follow-up    46yr aorta/iliac follow up    The patient returns to the office for surveillance of a known abdominal aortic aneurysm. Patient denies new onset of abdominal pain or unusual back pain, no other abdominal complaints. No changes suggesting embolic episodes.  Her abdominal aortic aneurysm was found incidentally during a CT scan for kidney stones.  There have been no significant interval changes in the patient's overall health care since his last visit.  Patient denies amaurosis fugax or TIA symptoms. There is no history of claudication or rest pain symptoms of the lower extremities. The patient denies angina or shortness of breath.   Duplex US  of the aorta and iliac arteries shows an AAA measured 2.6 cm.  There is no significant change from studies last year.  The right common iliac artery has velocities that would indicate a stenosis slightly greater than 50%.    Review of Systems  Cardiovascular:  Negative for leg swelling.  Skin:  Negative for wound.  All other systems reviewed and are negative.      Objective:    Physical Exam Vitals reviewed.  HENT:     Head: Normocephalic.  Cardiovascular:     Rate and Rhythm: Normal rate.     Pulses: Normal pulses.  Pulmonary:     Effort: Pulmonary effort is normal.  Skin:    General: Skin is warm and dry.  Neurological:     Mental Status: She is alert and oriented to person, place, and time.  Psychiatric:        Mood and Affect: Mood normal.        Behavior: Behavior normal.        Thought Content: Thought content normal.        Judgment: Judgment normal.     BP (!) 103/59   Pulse (!) 53   Resp 16   Wt 115 lb 9.6 oz (52.4 kg)   BMI 19.84 kg/m   Past Medical History:  Diagnosis Date   Anxiety    Chronic pain of right knee    Depression    Osteoarthritis    Restless leg  syndrome    Tremors of nervous system    Vitamin D  deficiency     Social History   Socioeconomic History   Marital status: Married    Spouse name: Not on file   Number of children: 2   Years of education: 12   Highest education level: 12th grade  Occupational History   Occupation: retired  Tobacco Use   Smoking status: Former    Current packs/day: 0.00    Types: Cigarettes    Quit date: 04/27/1988    Years since quitting: 35.4   Smokeless tobacco: Never   Tobacco comments:    vapes  Vaping Use   Vaping status: Some Days  Substance and Sexual Activity   Alcohol use: Yes    Comment: very rare - 1 time per year   Drug use: Yes    Types: Marijuana    Comment: does it thru a vape - uses THC - "helps with anxiety"   Sexual activity: Not on file  Other Topics Concern   Not on file  Social History Narrative   2 children; one child lives with her and her 4 children   Right handed   One  story home   Social Drivers of Health   Financial Resource Strain: Low Risk  (09/21/2023)   Received from [facility]   Overall Financial Resource Strain (CARDIA)    Difficulty of Paying Living Expenses: Not hard at all  Food Insecurity: No Food Insecurity (09/21/2023)   Received from [facility]   Hunger Vital Sign    Worried About Running Out of Food in the Last Year: Never true    Ran Out of Food in the Last Year: Never true  Transportation Needs: No Transportation Needs (09/21/2023)   Received from [facility] - Transportation    In the past 12 months, has lack of transportation kept you from medical appointments or from getting medications?: No    Lack of Transportation (Non-Medical): No  Physical Activity: Insufficiently Active (06/17/2023)   Exercise Vital Sign    Days of Exercise per Week: 4 days    Minutes of Exercise per Session: 20 min  Stress: No Stress Concern Present (06/17/2023)   [employer] of  Occupational Health - Occupational Stress Questionnaire    Feeling of Stress : Only a little  Social Connections: Unknown (06/17/2023)   Social Connection and Isolation Panel [NHANES]    Frequency of Communication with Friends and Family: More than three times a week    Frequency of Social Gatherings with Friends and Family: More than three times a week    Attends Religious Services: Patient declined    Active Member of Clubs or Organizations: Patient declined    Attends Engineer, structural: More than 4 times per year    Marital Status: Married  Catering manager Violence: Not At Risk (01/13/2023)   Humiliation, Afraid, Rape, and Kick questionnaire    Fear of Current or Ex-Partner: No    Emotionally Abused: No    Physically Abused: No    Sexually Abused: No    Past Surgical History:  Procedure Laterality Date   CESAREAN SECTION     COLONOSCOPY WITH PROPOFOL  N/A 08/06/2022   Procedure: COLONOSCOPY WITH PROPOFOL ;  Surgeon: Luke Salaam, MD;  Location: [facility] ENDOSCOPY;  Service: Gastroenterology;  Laterality: N/A;   EYE SURGERY Bilateral 2024   teeth all removed      Family History  Problem Relation Age of Onset   Dementia Mother 88   Seizures Father    Heart attack Sister        MI; she was step sister, 82 years old   Breast cancer Maternal Grandmother 15    Allergies  Allergen Reactions   Ciprofloxacin     Sick with C.Diff for 2 months after taking medication   Codeine Nausea And Vomiting       Latest Ref Rng & Units 01/08/2023    9:45 AM 05/06/2022    3:37 AM 11/18/2021    9:02 AM  CBC  WBC 4.0 - 10.5 K/uL 8.3  14.4  7.8   Hemoglobin 12.0 - 15.0 g/dL 81.1  91.4  78.2   Hematocrit 36.0 - 46.0 % 39.0  39.9  37.2   Platelets 150.0 - 400.0 K/uL 293.0  283  244.0        CMP     Component Value Date/Time   NA 139 05/06/2022 0337   NA 139 09/15/2013 1214   K 3.5 05/06/2022 0337   K 3.3 (L) 09/15/2013 1214   CL 103 05/06/2022 0337   CL 103 09/15/2013 1214    CO2 23 05/06/2022  0337   CO2 31 09/15/2013 1214   GLUCOSE 182 (H) 05/06/2022 0337   GLUCOSE 103 (H) 09/15/2013 1214   BUN 16 05/06/2022 0337   BUN 8 09/15/2013 1214   CREATININE 1.17 (H) 05/06/2022 0337   CREATININE 0.89 09/15/2013 1214   CALCIUM  8.9 05/06/2022 0337   CALCIUM  9.1 09/15/2013 1214   PROT 6.8 01/08/2023 0945   PROT 7.5 09/15/2013 1214   ALBUMIN 4.0 01/08/2023 0945   ALBUMIN 3.8 09/15/2013 1214   AST 15 01/08/2023 0945   AST 21 09/15/2013 1214   ALT 13 01/08/2023 0945   ALT 15 09/15/2013 1214   ALKPHOS 39 01/08/2023 0945   ALKPHOS 58 09/15/2013 1214   BILITOT 0.5 01/08/2023 0945   BILITOT 0.5 09/15/2013 1214   GFR 53.17 (L) 03/28/2021 1107   GFRNONAA 51 (L) 05/06/2022 0337   GFRNONAA >60 09/15/2013 1214     No results found.     Assessment & Plan:   1. Infrarenal abdominal aortic aneurysm (AAA) without rupture (HCC) (Primary) Recommend: No surgery or intervention is indicated at this time.  The patient has an asymptomatic abdominal aortic aneurysm that is less than 4 cm in maximal diameter.    I have reviewed the natural history of abdominal aortic aneurysm and the small risk of rupture for aneurysm less than 5 cm in size.  However, as these small aneurysms tend to enlarge over time, continued surveillance with ultrasound or CT scan is mandatory.   I have also discussed optimizing medical management with hypertension and lipid control and the negative effect that any tobacco products have on aneurysmal disease.  The patient is also encouraged to exercise a minimum of 30 minutes 4 times a week.   Should the patient develop new onset abdominal or back pain or signs of peripheral embolization they are instructed to seek medical attention immediately and to alert the physician providing care that they have an aneurysm.   The patient voices their understanding.  The patient will return in 12 months with an aortic duplex.  2. 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  She currently is not exhibiting any symptoms such as claudication, rest pain or unhealed ulcerations.  Based on that we will continue to monitor annually during her abdominal aortic aneurysm exams.  3. Osteoarthritis of multiple joints, unspecified osteoarthritis type Continue medications to treat the patient's degenerative disease as already ordered, these medications have been reviewed and there are no changes at this time.  Continued activity and therapy was stressed.  4. Hyperlipidemia, unspecified hyperlipidemia type Continue statin as ordered and reviewed, no changes at this time    Current Outpatient Medications on File Prior to Visit  Medication Sig Dispense Refill   albuterol  (VENTOLIN  HFA) 108 (90 Base) MCG/ACT inhaler Inhale 1-2 puffs into the lungs every 6 (six) hours as needed for wheezing or shortness of breath. 18 g 11   alendronate  (FOSAMAX ) 70 MG tablet TAKE 1 TABLET BY MOUTH EVERY 7 (SEVEN) DAYS. TAKE WITH A FULL GLASS OF WATER ON AN EMPTY STOMACH. 12 tablet 3   busPIRone  (BUSPAR ) 10 MG tablet TAKE 1 TABLET BY MOUTH TWICE A DAY 180 tablet 3   DULoxetine  (CYMBALTA ) 30 MG capsule TAKE 1 CAPSULE BY MOUTH EVERY DAY 90 capsule 1   DULoxetine  (CYMBALTA ) 60 MG capsule TAKE 1 CAPSULE BY MOUTH EVERY DAY 90 capsule 3   fluticasone  (FLONASE ) 50 MCG/ACT nasal spray Place 2 sprays  into both nostrils daily. 16 g 6   gabapentin (NEURONTIN) 300 MG capsule Take 300 mg by mouth at bedtime.     primidone  (MYSOLINE ) 50 MG tablet Take 0.5 tablets (25 mg total) by mouth at bedtime. 90 tablet 3   rOPINIRole  (REQUIP ) 0.5 MG tablet TAKE 1 TABLET (0.5 MG TOTAL) BY MOUTH AT BEDTIME. TAKE WITH ROPINIROLE  2 MG AT BEDTIME 90 tablet 2   rOPINIRole  (REQUIP ) 2 MG tablet TAKE 1 TABLET BY MOUTH AT BEDTIME. 90 tablet 3   rosuvastatin  (CRESTOR ) 20 MG tablet TAKE 1 TABLET BY MOUTH EVERY DAY 90 tablet 1   No current  facility-administered medications on file prior to visit.    There are no Patient Instructions on file for this visit. No follow-ups on file.   Simrah Chatham E Kalika Smay, NP

## 2023-11-08 ENCOUNTER — Telehealth: Payer: Self-pay

## 2023-11-08 NOTE — Telephone Encounter (Signed)
 Copied from CRM [id number]. Topic: Clinical - Medication Question >> Nov 08, 2023 12:30 PM [name] wrote: Reason for CRM: Patient Neurologist prescribed gabapentin (NEURONTIN) 300 MG capsule for patient - patient is 117 pounds and she read an article that says the gabapentin can contribute to dementia? She also wants to know if it's safe to take since she's also taking the rOPINIRole  (REQUIP ) 0.5 MG tablet? Please call her at [phone] (M)

## 2023-11-09 NOTE — Telephone Encounter (Signed)
 Spoke to pt discussed message below regarding medication and sedating effects, pt verbalized understanding, she states that at this time she does not need a visit but will schedule if she needs

## 2023-11-09 NOTE — Telephone Encounter (Signed)
 noted

## 2023-12-01 ENCOUNTER — Other Ambulatory Visit: Payer: Self-pay | Admitting: Family

## 2023-12-01 DIAGNOSIS — I700 Atherosclerosis of aorta: Secondary | ICD-10-CM

## 2023-12-04 ENCOUNTER — Other Ambulatory Visit: Payer: Self-pay | Admitting: Family

## 2023-12-04 DIAGNOSIS — F418 Other specified anxiety disorders: Secondary | ICD-10-CM

## 2023-12-14 ENCOUNTER — Other Ambulatory Visit: Payer: Self-pay | Admitting: Family

## 2023-12-14 DIAGNOSIS — J301 Allergic rhinitis due to pollen: Secondary | ICD-10-CM

## 2023-12-22 ENCOUNTER — Ambulatory Visit (INDEPENDENT_AMBULATORY_CARE_PROVIDER_SITE_OTHER): Admitting: Family

## 2023-12-22 ENCOUNTER — Encounter: Payer: Self-pay | Admitting: Family

## 2023-12-22 VITALS — BP 112/70 | HR 63 | Temp 98.5°F | Ht 64.0 in | Wt 114.0 lb

## 2023-12-22 DIAGNOSIS — E785 Hyperlipidemia, unspecified: Secondary | ICD-10-CM | POA: Diagnosis not present

## 2023-12-22 DIAGNOSIS — R7309 Other abnormal glucose: Secondary | ICD-10-CM

## 2023-12-22 DIAGNOSIS — G250 Essential tremor: Secondary | ICD-10-CM | POA: Diagnosis not present

## 2023-12-22 DIAGNOSIS — I700 Atherosclerosis of aorta: Secondary | ICD-10-CM | POA: Diagnosis not present

## 2023-12-22 DIAGNOSIS — Z1231 Encounter for screening mammogram for malignant neoplasm of breast: Secondary | ICD-10-CM | POA: Diagnosis not present

## 2023-12-22 DIAGNOSIS — F418 Other specified anxiety disorders: Secondary | ICD-10-CM | POA: Diagnosis not present

## 2023-12-22 DIAGNOSIS — G2581 Restless legs syndrome: Secondary | ICD-10-CM

## 2023-12-22 MED ORDER — GABAPENTIN 100 MG PO CAPS: 200.0000 mg | ORAL_CAPSULE | Freq: Every day | ORAL | 3 refills | Status: DC

## 2023-12-22 NOTE — Patient Instructions (Signed)
 Please call  and schedule your 3D mammogram and /or bone density scan as we discussed.   [facility]  ( new location in 2023)  [address], [place], [place] [address]  [place], [place]  [phone]   You may decrease gabapentin  from 300 mg at bedtime to 200 mg at bedtime.  After a week or so, you may then decrease to 100 mg if needed.  Please further discussed gabapentin  with neurology next month

## 2023-12-22 NOTE — Progress Notes (Unsigned)
   Assessment & Plan:  There are no diagnoses linked to this encounter.   Return precautions given.   Risks, benefits, and alternatives of the medications and treatment plan prescribed today were discussed, and patient expressed understanding.   Education regarding symptom management and diagnosis given to patient on AVS either electronically or printed.  No follow-ups on file.  Michelle Northern, FNP  Subjective:    Patient ID: Michelle Walsh, female    DOB: June 16, 1955, 68 y.o.   MRN: [id number]  CC: Michelle Walsh is a 68 y.o. female who presents today for follow up.   HPI: HPI Consult with vascular 10/01/2023 for infrarenal abdominal aortic aneurysm, right common iliac artery stenosis.  Repeat surveillance in 12 months time Consult with Dr. Maree, neurology 09/21/2023 restless leg syndrome, essential tremor, cervical dystonia Started on gabapentin  300 mg qhs.  Avoided beta-blocker  Allergies: Ciprofloxacin and Codeine Current Outpatient Medications on File Prior to Visit  Medication Sig Dispense Refill   albuterol  (VENTOLIN  HFA) 108 (90 Base) MCG/ACT inhaler Inhale 1-2 puffs into the lungs every 6 (six) hours as needed for wheezing or shortness of breath. 18 g 11   alendronate  (FOSAMAX ) 70 MG tablet TAKE 1 TABLET BY MOUTH EVERY 7 (SEVEN) DAYS. TAKE WITH A FULL GLASS OF WATER ON AN EMPTY STOMACH. 12 tablet 3   busPIRone  (BUSPAR ) 10 MG tablet TAKE 1 TABLET BY MOUTH TWICE A DAY 180 tablet 3   DULoxetine  (CYMBALTA ) 30 MG capsule TAKE 1 CAPSULE BY MOUTH EVERY DAY 90 capsule 1   DULoxetine  (CYMBALTA ) 60 MG capsule TAKE 1 CAPSULE BY MOUTH EVERY DAY 90 capsule 3   fluticasone  (FLONASE ) 50 MCG/ACT nasal spray SPRAY 2 SPRAYS INTO EACH NOSTRIL EVERY DAY 48 mL 2   gabapentin  (NEURONTIN ) 300 MG capsule Take 300 mg by mouth at bedtime.     primidone  (MYSOLINE ) 50 MG tablet Take 0.5 tablets (25 mg total) by mouth at bedtime. 90 tablet 3   rOPINIRole  (REQUIP ) 0.5 MG tablet TAKE 1 TABLET (0.5 MG TOTAL)  BY MOUTH AT BEDTIME. TAKE WITH ROPINIROLE  2 MG AT BEDTIME 90 tablet 2   rOPINIRole  (REQUIP ) 2 MG tablet TAKE 1 TABLET BY MOUTH AT BEDTIME. 90 tablet 3   rosuvastatin  (CRESTOR ) 20 MG tablet TAKE 1 TABLET BY MOUTH EVERY DAY 30 tablet 1   No current facility-administered medications on file prior to visit.    Review of Systems    Objective:    BP 112/70   Pulse 63   Temp 98.5 F (36.9 C) (Oral)   Ht 5' 4 (1.626 m)   Wt 114 lb (51.7 kg)   SpO2 99%   BMI 19.57 kg/m  BP Readings from Last 3 Encounters:  12/22/23 112/70  10/01/23 (!) 103/59  08/19/23 110/60   Wt Readings from Last 3 Encounters:  12/22/23 114 lb (51.7 kg)  10/01/23 115 lb 9.6 oz (52.4 kg)  08/19/23 113 lb 6.4 oz (51.4 kg)    Physical Exam

## 2023-12-23 NOTE — Assessment & Plan Note (Signed)
 Chronic, symptomatically stable.  Continue Crestor  20 mg daily.  Will update lipid panel at follow-up

## 2023-12-23 NOTE — Assessment & Plan Note (Signed)
 Chronic, stable . continue primidone  25mg .  Advised to decrease gabapentin  to 200 mg and then 100 mg if needed due to grogginess.  Follow-up scheduled with neurology next month

## 2023-12-23 NOTE — Assessment & Plan Note (Signed)
 Chronic, stable.  Continue Requip  2 mg nightly.

## 2023-12-23 NOTE — Assessment & Plan Note (Signed)
Chronic, stable.  Continue Cymbalta 90 mg qd,  BuSpar 10 mg twice daily

## 2023-12-26 ENCOUNTER — Other Ambulatory Visit: Payer: Self-pay | Admitting: Family

## 2023-12-26 DIAGNOSIS — I700 Atherosclerosis of aorta: Secondary | ICD-10-CM

## 2024-01-24 DIAGNOSIS — G250 Essential tremor: Secondary | ICD-10-CM | POA: Diagnosis not present

## 2024-01-24 DIAGNOSIS — G2581 Restless legs syndrome: Secondary | ICD-10-CM | POA: Diagnosis not present

## 2024-01-24 DIAGNOSIS — Z8659 Personal history of other mental and behavioral disorders: Secondary | ICD-10-CM | POA: Diagnosis not present

## 2024-01-24 DIAGNOSIS — G243 Spasmodic torticollis: Secondary | ICD-10-CM | POA: Diagnosis not present

## 2024-02-04 ENCOUNTER — Ambulatory Visit (INDEPENDENT_AMBULATORY_CARE_PROVIDER_SITE_OTHER): Admitting: *Deleted

## 2024-02-04 VITALS — Ht 64.0 in | Wt 112.0 lb

## 2024-02-04 DIAGNOSIS — Z0000 Encounter for general adult medical examination without abnormal findings: Secondary | ICD-10-CM | POA: Diagnosis not present

## 2024-02-04 NOTE — Patient Instructions (Signed)
 Michelle Walsh,  Thank you for taking the time for your Medicare Wellness Visit. I appreciate your continued commitment to your health goals. Please review the care plan we discussed, and feel free to reach out if I can assist you further.  Medicare recommends these wellness visits once per year to help you and your care team stay ahead of potential health issues. These visits are designed to focus on prevention, allowing your provider to concentrate on managing your acute and chronic conditions during your regular appointments.  Please note that Annual Wellness Visits do not include a physical exam. Some assessments may be limited, especially if the visit was conducted virtually. If needed, we may recommend a separate in-person follow-up with your provider.  Ongoing Care Seeing your primary care provider every 3 to 6 months helps us  monitor your health and provide consistent, personalized care.  Remember to update your flu vaccine.  Make sure you contact your Gastroenterologist to schedule your colonoscopy.  Referrals If a referral was made during today's visit and you haven't received any updates within two weeks, please contact the referred provider directly to check on the status.  Recommended Screenings:  Health Maintenance  Topic Date Due   Colon Cancer Screening  08/06/2023   COVID-19 Vaccine (4 - 2025-26 season) 12/27/2023   Flu Shot  07/25/2024*   Medicare Annual Wellness Visit  02/03/2025   Breast Cancer Screening  03/23/2025   DTaP/Tdap/Td vaccine (2 - Td or Tdap) 03/29/2033   Pneumococcal Vaccine for age over 2  Completed   DEXA scan (bone density measurement)  Completed   Hepatitis C Screening  Completed   Zoster (Shingles) Vaccine  Completed   Meningitis B Vaccine  Aged Out  *Topic was postponed. The date shown is not the original due date.       02/04/2024   11:41 AM  Advanced Directives  Does Patient Have a Medical Advance Directive? No  Would patient like  information on creating a medical advance directive? No - Patient declined   Advance Care Planning is important because it: Ensures you receive medical care that aligns with your values, goals, and preferences. Provides guidance to your family and loved ones, reducing the emotional burden of decision-making during critical moments.  Vision: Annual vision screenings are recommended for early detection of glaucoma, cataracts, and diabetic retinopathy. These exams can also reveal signs of chronic conditions such as diabetes and high blood pressure.  Dental: Annual dental screenings help detect early signs of oral cancer, gum disease, and other conditions linked to overall health, including heart disease and diabetes.  Please see the attached documents for additional preventive care recommendations.

## 2024-02-04 NOTE — Progress Notes (Signed)
 Subjective:   Michelle Walsh is a 68 y.o. who presents for a Medicare Wellness preventive visit.  As a reminder, Annual Wellness Visits don't include a physical exam, and some assessments may be limited, especially if this visit is performed virtually. We may recommend an in-person follow-up visit with your provider if needed.  Visit Complete: Virtual I connected with  Rock JINNY Sharps on 02/04/24 by a audio enabled telemedicine application and verified that I am speaking with the correct person using two identifiers.  Patient Location: Home  Provider Location: Home Office  I discussed the limitations of evaluation and management by telemedicine. The patient expressed understanding and agreed to proceed.  Vital Signs: Because this visit was a virtual/telehealth visit, some criteria may be missing or patient reported. Any vitals not documented were not able to be obtained and vitals that have been documented are patient reported.  VideoDeclined- This patient declined Librarian, academic. Therefore the visit was completed with audio only.  Persons Participating in Visit: Patient.  AWV Questionnaire: No: Patient Medicare AWV questionnaire was not completed prior to this visit.  Cardiac Risk Factors include: advanced age (>58men, >49 women);dyslipidemia     Objective:    Today's Vitals   02/04/24 1129  Weight: 112 lb (50.8 kg)  Height: 5' 4 (1.626 m)   Body mass index is 19.22 kg/m.     02/04/2024   11:41 AM 01/13/2023   10:34 AM 08/06/2022    8:53 AM 01/09/2022    2:07 PM 01/08/2021   12:51 PM 12/23/2020    8:30 PM 12/18/2020   10:44 AM  Advanced Directives  Does Patient Have a Medical Advance Directive? No No No No No No No  Would patient like information on creating a medical advance directive? No - Patient declined No - Patient declined  No - Patient declined No - Patient declined No - Patient declined     Current Medications  (verified) Outpatient Encounter Medications as of 02/04/2024  Medication Sig   albuterol  (VENTOLIN  HFA) 108 (90 Base) MCG/ACT inhaler Inhale 1-2 puffs into the lungs every 6 (six) hours as needed for wheezing or shortness of breath.   alendronate  (FOSAMAX ) 70 MG tablet TAKE 1 TABLET BY MOUTH EVERY 7 (SEVEN) DAYS. TAKE WITH A FULL GLASS OF WATER ON AN EMPTY STOMACH.   busPIRone  (BUSPAR ) 10 MG tablet TAKE 1 TABLET BY MOUTH TWICE A DAY   cholecalciferol (VITAMIN D3) 25 MCG (1000 UNIT) tablet Take 1,000 Units by mouth daily.   DULoxetine  (CYMBALTA ) 30 MG capsule TAKE 1 CAPSULE BY MOUTH EVERY DAY   DULoxetine  (CYMBALTA ) 60 MG capsule TAKE 1 CAPSULE BY MOUTH EVERY DAY   fluticasone  (FLONASE ) 50 MCG/ACT nasal spray SPRAY 2 SPRAYS INTO EACH NOSTRIL EVERY DAY   gabapentin  (NEURONTIN ) 300 MG capsule Take 300 mg by mouth at bedtime.   primidone  (MYSOLINE ) 50 MG tablet Take 0.5 tablets (25 mg total) by mouth at bedtime.   rOPINIRole  (REQUIP ) 2 MG tablet TAKE 1 TABLET BY MOUTH AT BEDTIME.   gabapentin  (NEURONTIN ) 100 MG capsule Take 2 capsules (200 mg total) by mouth at bedtime. (Patient not taking: Reported on 02/04/2024)   No facility-administered encounter medications on file as of 02/04/2024.    Allergies (verified) Ciprofloxacin and Codeine   History: Past Medical History:  Diagnosis Date   Anxiety    Chronic pain of right knee    Depression    Osteoarthritis    Restless leg syndrome    Tremors  of nervous system    Vitamin D  deficiency    Past Surgical History:  Procedure Laterality Date   CESAREAN SECTION     COLONOSCOPY WITH PROPOFOL  N/A 08/06/2022   Procedure: COLONOSCOPY WITH PROPOFOL ;  Surgeon: Therisa Bi, MD;  Location[facility];  Service: Gastroenterology;  Laterality: N/A;   EYE SURGERY Bilateral 2024   teeth all removed     Family History  Problem Relation Age of Onset   Dementia Mother 64   Seizures Father    Heart attack Sister        MI; she was step sister, 82  years old   Breast cancer Maternal Grandmother 81   Social History   Socioeconomic History   Marital status: Married    Spouse name: Not on file   Number of children: 2   Years of education: 12   Highest education level: 12th grade  Occupational History   Occupation: retired  Tobacco Use   Smoking status: Former    Current packs/day: 0.00    Types: Cigarettes    Quit date: 04/27/1988    Years since quitting: 35.7   Smokeless tobacco: Never   Tobacco comments:    vapes  Vaping Use   Vaping status: Some Days  Substance and Sexual Activity   Alcohol use: Yes    Comment: very rare - 1 time per year   Drug use: Yes    Types: Marijuana    Comment: does it thru a vape - uses THC - helps with anxiety   Sexual activity: Not on file  Other Topics Concern   Not on file  Social History Narrative   2 children; one child lives with her and her 4 children   Right handed   One story home   Social Drivers of Health   Financial Resource Strain: Low Risk  (02/04/2024)   Overall Financial Resource Strain (CARDIA)    Difficulty of Paying Living Expenses: Not hard at all  Food Insecurity: No Food Insecurity (02/04/2024)   Hunger Vital Sign    Worried About Running Out of Food in the Last Year: Never true    Ran Out of Food in the Last Year: Never true  Transportation Needs: No Transportation Needs (02/04/2024)   PRAPARE - Administrator, Civil Service (Medical): No    Lack of Transportation (Non-Medical): No  Physical Activity: Insufficiently Active (02/04/2024)   Exercise Vital Sign    Days of Exercise per Week: 5 days    Minutes of Exercise per Session: 20 min  Stress: No Stress Concern Present (02/04/2024)   [employer] of Occupational Health - Occupational Stress Questionnaire    Feeling of Stress: Not at all  Social Connections: Moderately Integrated (02/04/2024)   Social Connection and Isolation Panel    Frequency of Communication with Friends and Family:  More than three times a week    Frequency of Social Gatherings with Friends and Family: More than three times a week    Attends Religious Services: More than 4 times per year    Active Member of [employer] or Organizations: No    Attends Banker Meetings: Never    Marital Status: Married    Tobacco Counseling Counseling given: Not Answered Tobacco comments: vapes    Clinical Intake:  Pre-visit preparation completed: Yes  Pain : No/denies pain     BMI - recorded: 19.22 Nutritional Status: BMI of 19-24  Normal Nutritional Risks: None Diabetes: No  Lab Results  Component  Value Date   HGBA1C 5.7 (H) 12/23/2020   HGBA1C 5.7 07/18/2019     How often do you need to have someone help you when you read instructions, pamphlets, or other written materials from your doctor or pharmacy?: 1 - Never  Interpreter Needed?: No  Information entered by :: R. Kentley Blyden LPN   Activities of Daily Living     02/04/2024   11:30 AM  In your present state of health, do you have any difficulty performing the following activities:  Hearing? 1  Vision? 0  Difficulty concentrating or making decisions? 0  Walking or climbing stairs? 0  Dressing or bathing? 0  Doing errands, shopping? 1  Preparing Food and eating ? N  Using the Toilet? N  In the past six months, have you accidently leaked urine? Y  Do you have problems with loss of bowel control? N  Managing your Medications? N  Managing your Finances? N  Housekeeping or managing your Housekeeping? N    Patient Care Team: Dineen Rollene MATSU, FNP as PCP - General (Family Medicine) Cindie Jesusa HERO, RN as Registered Nurse Dannielle Arlean FALCON, RN (Inactive) as Registered Nurse Tat, Asberry RAMAN, DO as Consulting Physician (Neurology) Therisa Bi, MD as Consulting Physician (Gastroenterology) Maree Jannett POUR, MD as Consulting Physician (Neurology)  I have updated your Care Teams any recent Medical Services you may have received from other  providers in the past year.     Assessment:   This is a routine wellness examination for [place].  Hearing/Vision screen Hearing Screening - Comments:: Some issues, but no aids Vision Screening - Comments:: readers   Goals Addressed             This Visit's Progress    Patient Stated       Wants to continue to exercise and stay active       Depression Screen     02/04/2024   11:37 AM 12/22/2023   10:40 AM 08/19/2023   10:08 AM 08/19/2023   10:07 AM 06/21/2023   10:10 AM 01/13/2023   10:29 AM 02/25/2022    9:09 AM  PHQ 2/9 Scores  PHQ - 2 Score 0 0 0 0 0 0 2  PHQ- 9 Score 0 0 0   1 5    Fall Risk     02/04/2024   11:32 AM 12/22/2023   10:40 AM 08/19/2023   10:07 AM 06/21/2023   10:10 AM 01/13/2023   10:26 AM  Fall Risk   Falls in the past year? 0 0 0 0 1  Number falls in past yr: 0 0 0 0 0  Injury with Fall? 0 0 0 0 0  Risk for fall due to : No Fall Risks No Fall Risks No Fall Risks No Fall Risks History of fall(s);Impaired balance/gait  Follow up Falls evaluation completed;Falls prevention discussed Falls evaluation completed Falls evaluation completed Falls evaluation completed Falls prevention discussed;Falls evaluation completed    MEDICARE RISK AT HOME:  Medicare Risk at Home Any stairs in or around the home?: Yes If so, are there any without handrails?: No Home free of loose throw rugs in walkways, pet beds, electrical cords, etc?: Yes Adequate lighting in your home to reduce risk of falls?: Yes Life alert?: No Use of a cane, walker or w/c?: No Grab bars in the bathroom?: No Shower chair or bench in shower?: No Elevated toilet seat or a handicapped toilet?: No  TIMED UP AND GO:  Was the test performed?  No  Cognitive Function: 6CIT completed    03/12/2021   12:29 PM  MMSE - Mini Mental State Exam  Orientation to time 5  Orientation to Place 5  Registration 3  Attention/ Calculation 5  Recall 2  Language- name 2 objects 2  Language- repeat 1   Language- follow 3 step command 3  Language- read & follow direction 1  Write a sentence 1  Copy design 1  Total score 29        02/04/2024   11:41 AM 01/13/2023   10:34 AM 01/09/2022    2:17 PM 11/15/2019    3:50 PM  6CIT Screen  What Year? 0 points 0 points 0 points 0 points  What month? 0 points 0 points 0 points 0 points  What time? 0 points 0 points 0 points   Count back from 20 0 points 0 points 0 points   Months in reverse 0 points 0 points 0 points 0 points  Repeat phrase 0 points 0 points 0 points 0 points  Total Score 0 points 0 points 0 points     Immunizations Immunization History  Administered Date(s) Administered   Fluad Quad(high Dose 65+) 02/13/2020, 01/03/2021, 02/25/2022   Fluad Trivalent(High Dose 65+) 06/21/2023   INFLUENZA, HIGH DOSE SEASONAL PF 03/02/2019   Moderna Sars-Covid-2 Vaccination 07/17/2019, 08/14/2019, 05/03/2020   Pneumococcal Conjugate-13 11/17/2018   Pneumococcal Polysaccharide-23 12/25/2019   Tdap 03/30/2023   Zoster Recombinant(Shingrix) 03/12/2020, 06/25/2020    Screening Tests Health Maintenance  Topic Date Due   Colonoscopy  08/06/2023   COVID-19 Vaccine (4 - 2025-26 season) 12/27/2023   Medicare Annual Wellness (AWV)  01/13/2024   Influenza Vaccine  07/25/2024 (Originally 11/26/2023)   Mammogram  03/23/2025   DTaP/Tdap/Td (2 - Td or Tdap) 03/29/2033   Pneumococcal Vaccine: 50+ Years  Completed   DEXA SCAN  Completed   Hepatitis C Screening  Completed   Zoster Vaccines- Shingrix  Completed   Meningococcal B Vaccine  Aged Out    Health Maintenance Items Addressed: Discussed the need to update flu vaccine and she plans to get it at her pharmacy..  Patient stated that she is aware that she is due a colonoscopy and will reach out to her gastroenterologist to get it scheduled.    Additional Screening:  Vision Screening: Recommended annual ophthalmology exams for early detection of glaucoma and other disorders of the eye. Is  the patient up to date with their annual eye exam?  Yes  Who is the provider or what is the name of the office in which the patient attends annual eye exams?  [facility]  Dental Screening: Recommended annual dental exams for proper oral hygiene  Community Resource Referral / Chronic Care Management: CRR required this visit?  No   CCM required this visit?  No   Plan:    I have personally reviewed and noted the following in the patient's chart:   Medical and social history Use of alcohol, tobacco or illicit drugs  Current medications and supplements including opioid prescriptions. Patient is not currently taking opioid prescriptions. Functional ability and status Nutritional status Physical activity Advanced directives List of other physicians Hospitalizations, surgeries, and ER visits in previous 12 months Vitals Screenings to include cognitive, depression, and falls Referrals and appointments  In addition, I have reviewed and discussed with patient certain preventive protocols, quality metrics, and best practice recommendations. A written personalized care plan for preventive services as well as general preventive health recommendations were provided to  patient.   Angeline Fredericks, LPN   [id number]   After Visit Summary: (MyChart) Due to this being a telephonic visit, the after visit summary with patients personalized plan was offered to patient via MyChart   Notes: Nothing significant to report at this time.

## 2024-02-10 DIAGNOSIS — G250 Essential tremor: Secondary | ICD-10-CM | POA: Diagnosis not present

## 2024-02-22 ENCOUNTER — Telehealth: Payer: Self-pay | Admitting: Family

## 2024-02-22 ENCOUNTER — Other Ambulatory Visit: Payer: Self-pay | Admitting: Family

## 2024-02-22 DIAGNOSIS — E559 Vitamin D deficiency, unspecified: Secondary | ICD-10-CM

## 2024-02-22 DIAGNOSIS — M8580 Other specified disorders of bone density and structure, unspecified site: Secondary | ICD-10-CM

## 2024-02-22 NOTE — Telephone Encounter (Signed)
 Call patient Reviewing chart as I am refilling Fosamax .  I do not see a recent vitamin D  lab.  Please order vitamin D  and schedule.  Please ensure that she is taking vitamin D  and calcium  in the setting of osteopenia.

## 2024-02-23 NOTE — Telephone Encounter (Signed)
 Spoke to pt she is taking the Vit D and Calcium  daily and I scheduled Vit D lab appt for Nov 4th ! 10:15am

## 2024-02-29 ENCOUNTER — Other Ambulatory Visit (INDEPENDENT_AMBULATORY_CARE_PROVIDER_SITE_OTHER)

## 2024-02-29 DIAGNOSIS — E559 Vitamin D deficiency, unspecified: Secondary | ICD-10-CM | POA: Diagnosis not present

## 2024-03-01 LAB — VITAMIN D 25 HYDROXY (VIT D DEFICIENCY, FRACTURES): VITD: 41.89 ng/mL (ref 30.00–100.00)

## 2024-03-02 ENCOUNTER — Ambulatory Visit: Payer: Self-pay | Admitting: Family

## 2024-03-06 ENCOUNTER — Other Ambulatory Visit: Payer: Self-pay | Admitting: Family

## 2024-03-06 DIAGNOSIS — F418 Other specified anxiety disorders: Secondary | ICD-10-CM

## 2024-04-22 ENCOUNTER — Other Ambulatory Visit: Payer: Self-pay | Admitting: Family

## 2024-04-22 DIAGNOSIS — G2581 Restless legs syndrome: Secondary | ICD-10-CM

## 2024-04-22 DIAGNOSIS — G250 Essential tremor: Secondary | ICD-10-CM

## 2024-04-25 NOTE — Telephone Encounter (Signed)
 Spoke to pt she states that she would like to go back to just the 300 mg she has been taking her 100 along with the 200 mg

## 2024-04-26 ENCOUNTER — Other Ambulatory Visit: Payer: Self-pay | Admitting: Family

## 2024-04-26 MED ORDER — GABAPENTIN 300 MG PO CAPS: 300.0000 mg | ORAL_CAPSULE | Freq: Every day | ORAL | 3 refills | Status: AC

## 2024-04-26 NOTE — Telephone Encounter (Signed)
 Correction I refilled gabapentin  300 mg.  I discontinued gabapentin  100 mg

## 2024-04-26 NOTE — Telephone Encounter (Signed)
 Call patient I Refilled gabapentin  20 mg nightly and Discontinued gabapentin  100 mg.

## 2024-06-19 ENCOUNTER — Ambulatory Visit: Admitting: Family

## 2024-09-29 ENCOUNTER — Encounter (INDEPENDENT_AMBULATORY_CARE_PROVIDER_SITE_OTHER)

## 2024-09-29 ENCOUNTER — Ambulatory Visit (INDEPENDENT_AMBULATORY_CARE_PROVIDER_SITE_OTHER): Admitting: Nurse Practitioner

## 2025-02-06 ENCOUNTER — Ambulatory Visit
